# Patient Record
Sex: Female | Born: 1960 | Race: Black or African American | Hispanic: No | Marital: Single | Smoking: Former smoker
Health system: Southern US, Community
[De-identification: ages and names within clinical notes are randomized; demographics above are authoritative.]

## PROBLEM LIST (undated history)

## (undated) DIAGNOSIS — E785 Hyperlipidemia, unspecified: Secondary | ICD-10-CM

## (undated) DIAGNOSIS — F419 Anxiety disorder, unspecified: Secondary | ICD-10-CM

## (undated) DIAGNOSIS — F32A Depression, unspecified: Secondary | ICD-10-CM

## (undated) DIAGNOSIS — I10 Essential (primary) hypertension: Secondary | ICD-10-CM

## (undated) HISTORY — PX: ABDOMINAL HYSTERECTOMY: SHX81

---

## 2019-07-05 ENCOUNTER — Emergency Department (HOSPITAL_COMMUNITY)
Admission: EM | Admit: 2019-07-05 | Discharge: 2019-07-05 | Disposition: A | Discharge status: Home / Self Care | Payer: Medicaid Other | Attending: Emergency Medicine | Admitting: Emergency Medicine

## 2019-07-05 ENCOUNTER — Other Ambulatory Visit: Payer: Self-pay

## 2019-07-05 ENCOUNTER — Emergency Department (HOSPITAL_COMMUNITY): Payer: Medicaid Other

## 2019-07-05 ENCOUNTER — Encounter (HOSPITAL_COMMUNITY): Payer: Self-pay

## 2019-07-05 DIAGNOSIS — E876 Hypokalemia: Secondary | ICD-10-CM | POA: Insufficient documentation

## 2019-07-05 DIAGNOSIS — J9811 Atelectasis: Secondary | ICD-10-CM | POA: Diagnosis not present

## 2019-07-05 DIAGNOSIS — M545 Low back pain: Secondary | ICD-10-CM | POA: Diagnosis not present

## 2019-07-05 DIAGNOSIS — I10 Essential (primary) hypertension: Secondary | ICD-10-CM | POA: Diagnosis not present

## 2019-07-05 DIAGNOSIS — R58 Hemorrhage, not elsewhere classified: Secondary | ICD-10-CM | POA: Diagnosis not present

## 2019-07-05 DIAGNOSIS — K8020 Calculus of gallbladder without cholecystitis without obstruction: Secondary | ICD-10-CM | POA: Diagnosis not present

## 2019-07-05 DIAGNOSIS — R1084 Generalized abdominal pain: Secondary | ICD-10-CM | POA: Diagnosis not present

## 2019-07-05 DIAGNOSIS — R0602 Shortness of breath: Secondary | ICD-10-CM | POA: Diagnosis not present

## 2019-07-05 DIAGNOSIS — R0789 Other chest pain: Secondary | ICD-10-CM | POA: Insufficient documentation

## 2019-07-05 DIAGNOSIS — R310 Gross hematuria: Secondary | ICD-10-CM | POA: Diagnosis not present

## 2019-07-05 DIAGNOSIS — R52 Pain, unspecified: Secondary | ICD-10-CM | POA: Diagnosis not present

## 2019-07-05 DIAGNOSIS — N390 Urinary tract infection, site not specified: Secondary | ICD-10-CM | POA: Insufficient documentation

## 2019-07-05 DIAGNOSIS — R079 Chest pain, unspecified: Secondary | ICD-10-CM | POA: Diagnosis not present

## 2019-07-05 DIAGNOSIS — M5489 Other dorsalgia: Secondary | ICD-10-CM | POA: Diagnosis not present

## 2019-07-05 DIAGNOSIS — R000 Tachycardia, unspecified: Secondary | ICD-10-CM | POA: Diagnosis not present

## 2019-07-05 HISTORY — DX: Depression, unspecified: F32.A

## 2019-07-05 HISTORY — DX: Hyperlipidemia, unspecified: E78.5

## 2019-07-05 HISTORY — DX: Anxiety disorder, unspecified: F41.9

## 2019-07-05 HISTORY — DX: Essential (primary) hypertension: I10

## 2019-07-05 LAB — CBC
HCT: 44.9 % (ref 36.0–46.0)
Hemoglobin: 14.3 g/dL (ref 12.0–15.0)
MCH: 31 pg (ref 26.0–34.0)
MCHC: 31.8 g/dL (ref 30.0–36.0)
MCV: 97.2 fL (ref 80.0–100.0)
Platelets: 465 10*3/uL — ABNORMAL HIGH (ref 150–400)
RBC: 4.62 MIL/uL (ref 3.87–5.11)
RDW: 17.2 % — ABNORMAL HIGH (ref 11.5–15.5)
WBC: 12.5 10*3/uL — ABNORMAL HIGH (ref 4.0–10.5)
nRBC: 0 % (ref 0.0–0.2)

## 2019-07-05 LAB — URINALYSIS, ROUTINE W REFLEX MICROSCOPIC
Glucose, UA: NEGATIVE mg/dL
Ketones, ur: NEGATIVE mg/dL
Nitrite: POSITIVE — AB
Protein, ur: 100 mg/dL — AB
RBC / HPF: >50 RBC/hpf — ABNORMAL HIGH (ref 0–5)
Specific Gravity, Urine: 1.023 (ref 1.005–1.030)
WBC, UA: >50 WBC/hpf — ABNORMAL HIGH (ref 0–5)
pH: 5 (ref 5.0–8.0)

## 2019-07-05 LAB — MAGNESIUM: Magnesium: 2.1 mg/dL (ref 1.7–2.4)

## 2019-07-05 LAB — WET PREP, GENITAL
Clue Cells Wet Prep HPF POC: NONE SEEN
Sperm: NONE SEEN
Trich, Wet Prep: NONE SEEN
WBC, Wet Prep HPF POC: NONE SEEN
Yeast Wet Prep HPF POC: NONE SEEN

## 2019-07-05 LAB — BASIC METABOLIC PANEL
Anion gap: 15 (ref 5–15)
BUN: 7 mg/dL (ref 6–20)
CO2: 26 mmol/L (ref 22–32)
Calcium: 8.8 mg/dL — ABNORMAL LOW (ref 8.9–10.3)
Chloride: 94 mmol/L — ABNORMAL LOW (ref 98–111)
Creatinine, Ser: 0.48 mg/dL (ref 0.44–1.00)
GFR calc Af Amer: >60 mL/min (ref >60)
GFR calc non Af Amer: >60 mL/min (ref >60)
Glucose, Bld: 122 mg/dL — ABNORMAL HIGH (ref 70–99)
Potassium: 2.9 mmol/L — ABNORMAL LOW (ref 3.5–5.1)
Sodium: 135 mmol/L (ref 135–145)

## 2019-07-05 LAB — TROPONIN I (HIGH SENSITIVITY): Troponin I (High Sensitivity): 10 ng/L (ref <18)

## 2019-07-05 LAB — D-DIMER, QUANTITATIVE: D-Dimer, Quant: 0.32 ug/mL-FEU (ref 0.00–0.50)

## 2019-07-05 MED ORDER — POTASSIUM CHLORIDE ER 10 MEQ PO TBCR
40.0000 meq | EXTENDED_RELEASE_TABLET | Freq: Every day | ORAL | 0 refills | Status: DC
Start: 2019-07-05 — End: 2019-07-05

## 2019-07-05 MED ORDER — POTASSIUM CHLORIDE CRYS ER 20 MEQ PO TBCR
40.0000 meq | EXTENDED_RELEASE_TABLET | Freq: Once | ORAL | Status: AC
  Administered 2019-07-05: 40 meq via ORAL
  Filled 2019-07-05: qty 2

## 2019-07-05 MED ORDER — CEPHALEXIN 500 MG PO CAPS
500.0000 mg | ORAL_CAPSULE | Freq: Two times a day (BID) | ORAL | 0 refills | Status: AC
Start: 2019-07-05 — End: 2019-07-12

## 2019-07-05 MED ORDER — POTASSIUM CHLORIDE ER 10 MEQ PO TBCR
40.0000 meq | EXTENDED_RELEASE_TABLET | Freq: Every day | ORAL | 0 refills | Status: DC
Start: 2019-07-05 — End: 2019-11-08

## 2019-07-05 MED ORDER — CEPHALEXIN 500 MG PO CAPS
500.0000 mg | ORAL_CAPSULE | Freq: Two times a day (BID) | ORAL | 0 refills | Status: DC
Start: 2019-07-05 — End: 2019-07-05

## 2019-07-05 MED ORDER — SODIUM CHLORIDE 0.9 % IV BOLUS
1000.0000 mL | Freq: Once | INTRAVENOUS | Status: AC
  Administered 2019-07-05: 1000 mL via INTRAVENOUS

## 2019-07-05 NOTE — Discharge Instructions (Signed)
Take the antibiotics as prescribed. You will need to take the potassium pills as well.  You will need to have your potassium level rechecked by your primary care provider in 1 week. Return to the ED for worsening chest pain, shortness of breath, leg swelling, severe abdominal pain or fever.

## 2019-07-05 NOTE — ED Provider Notes (Signed)
[facility] DEPT Provider Note   CSN: [id number] Arrival date & time: 07/05/19  0054     History Chief Complaint  Patient presents with  . Back Pain    Lydia Vasquez is a 59 y.o. female with a past medical history of hypertension, hyperlipidemia, anxiety presenting to the ED with multiple complaints. 1.  Complains of back pain.  Reports intermittent lower back pain for the past several weeks.  Pain recently got worse.  Feels like "something is going to burst in my back."  She denies any numbness in legs, loss of bowel or bladder function, injuries or falls.  She also reports blood in her urine and blood when she wipes.  She is concerned that this may be hematuria, she states that "I had a hysterectomy so I do not get.  So I do not think it is vaginal."  States that she is not currently sexually active.  Denies vaginal discharge, pelvic pain, abdominal pain, vomiting or diarrhea. She has not been sexually active for several years and is not concerned about STDs. 2.  Complains of chest pain.  Reports intermittent chest pressure in the central chest area without specific aggravating or alleviating factor.  This has been going on for several weeks as well.  She has not tried any medications to help with her back pain or chest pain.  She denies any leg swelling, history of DVT, PE, MI, recent immobilization, fever, cough or shortness of breath.  HPI     Past Medical History:  Diagnosis Date  . Anxiety   . Depression   . Hyperlipidemia   . Hypertension     There are no problems to display for this patient.   Past Surgical History:  Procedure Laterality Date  . ABDOMINAL HYSTERECTOMY       OB History   No obstetric history on file.     No family history on file.  Social History   Tobacco Use  . Smoking status: Not on file  Substance Use Topics  . Alcohol use: Not on file  . Drug use: Not on file    Home Medications Prior to Admission  medications   Medication Sig Start Date End Date Taking? Authorizing Provider  cephALEXin (KEFLEX) 500 MG capsule Take 1 capsule (500 mg total) by mouth 2 (two) times daily for 7 days. 07/05/19 07/12/19  Airik Goodlin, PA-C  potassium chloride (KLOR-CON) 10 MEQ tablet Take 4 tablets (40 mEq total) by mouth daily for 5 days. 07/05/19 07/10/19  Delia Heady, PA-C    Allergies    Patient has no known allergies.  Review of Systems   Review of Systems  Constitutional: Positive for fatigue. Negative for appetite change, chills and fever.  HENT: Negative for ear pain, rhinorrhea, sneezing and sore throat.   Eyes: Negative for photophobia and visual disturbance.  Respiratory: Negative for cough, chest tightness, shortness of breath and wheezing.   Cardiovascular: Positive for chest pain. Negative for palpitations.  Gastrointestinal: Negative for abdominal pain, blood in stool, constipation, diarrhea, nausea and vomiting.  Genitourinary: Positive for hematuria (?) and vaginal bleeding (?). Negative for dysuria, urgency and vaginal discharge.  Musculoskeletal: Positive for back pain. Negative for myalgias.  Skin: Negative for rash.  Neurological: Negative for dizziness, weakness and light-headedness.    Physical Exam Updated Vital Signs BP (!) 162/96   Pulse 100   Temp 98.9 F (37.2 C) (Oral)   Resp (!) 24   Ht 5\' 11"  (1.803 m)  Wt 127 kg   SpO2 98%   BMI 39.05 kg/m   Physical Exam Vitals and nursing note reviewed. Exam conducted with a chaperone present.  Constitutional:      General: She is not in acute distress.    Appearance: She is well-developed.  HENT:     Head: Normocephalic and atraumatic.     Nose: Nose normal.  Eyes:     General: No scleral icterus.       Right eye: No discharge.        Left eye: No discharge.     Conjunctiva/sclera: Conjunctivae normal.  Cardiovascular:     Rate and Rhythm: Normal rate and regular rhythm.     Heart sounds: Normal heart sounds. No  murmur heard.  No friction rub. No gallop.   Pulmonary:     Effort: Pulmonary effort is normal. No respiratory distress.     Breath sounds: Normal breath sounds.  Abdominal:     General: Bowel sounds are normal. There is no distension.     Palpations: Abdomen is soft.     Tenderness: There is no abdominal tenderness. There is no guarding.  Genitourinary:    Comments: Pelvic exam: normal external genitalia without evidence of trauma. VULVA: normal appearing vulva with no masses, tenderness or lesion. VAGINA: normal appearing vagina with normal color and discharge, no lesions. No vaginal bleeding noted. CERVIX: normal appearing cervix without lesions, cervical motion tenderness absent, cervical os closed with out purulent discharge; No vaginal discharge. Wet prep and DNA probe for chlamydia and GC obtained.   ADNEXA: normal adnexa in size, nontender and no masses UTERUS: uterus is normal size, shape, consistency and nontender.   Musculoskeletal:        General: Normal range of motion.     Cervical back: Normal range of motion and neck supple.     Lumbar back: Tenderness and bony tenderness present.       Back:     Comments: No midline spinal tenderness present in lumbar, thoracic or cervical spine. No step-off palpated. No visible bruising, edema or temperature change noted. No objective signs of numbness present. No saddle anesthesia. 2+ DP pulses bilaterally. Sensation intact to light touch. Strength 5/5 in bilateral lower extremities.  Skin:    General: Skin is warm and dry.     Findings: No rash.  Neurological:     Mental Status: She is alert.     Motor: No abnormal muscle tone.     Coordination: Coordination normal.     ED Results / Procedures / Treatments   Labs (all labs ordered are listed, but only abnormal results are displayed) Labs Reviewed  URINALYSIS, ROUTINE W REFLEX MICROSCOPIC - Abnormal; Notable for the following components:      Result Value   Color, Urine AMBER  (*)    APPearance CLOUDY (*)    Hgb urine dipstick LARGE (*)    Bilirubin Urine SMALL (*)    Protein, ur 100 (*)    Nitrite POSITIVE (*)    Leukocytes,Ua SMALL (*)    RBC / HPF >50 (*)    WBC, UA >50 (*)    Bacteria, UA MANY (*)    All other components within normal limits  CBC - Abnormal; Notable for the following components:   WBC 12.5 (*)    RDW 17.2 (*)    Platelets 465 (*)    All other components within normal limits  BASIC METABOLIC PANEL - Abnormal; Notable for the following components:  Potassium 2.9 (*)    Chloride 94 (*)    Glucose, Bld 122 (*)    Calcium 8.8 (*)    All other components within normal limits  WET PREP, GENITAL  D-DIMER, QUANTITATIVE (NOT AT [facility])  MAGNESIUM  TROPONIN I (HIGH SENSITIVITY)    EKG None  Radiology DG Chest 2 View  Result Date: 07/05/2019 CLINICAL DATA:  Chest pain and shortness of breath EXAM: CHEST - 2 VIEW COMPARISON:  None. FINDINGS: Low lung volumes with streaky density at both bases. Mild eventration of the right diaphragm. There is no edema, consolidation, effusion, or pneumothorax. Normal heart size and mediastinal contours. IMPRESSION: Low volume chest with atelectasis at the bases. Electronically Signed   By: Marnee Spring M.D.   On: 07/05/2019 07:41   CT Renal Stone Study  Result Date: 07/05/2019 CLINICAL DATA:  Acute bilateral flank pain, gross hematuria. EXAM: CT ABDOMEN AND PELVIS WITHOUT CONTRAST TECHNIQUE: Multidetector CT imaging of the abdomen and pelvis was performed following the standard protocol without IV contrast. COMPARISON:  None. FINDINGS: Lower chest: No acute abnormality. Hepatobiliary: Minimal cholelithiasis is noted without inflammation. No biliary dilatation is noted. Hepatic steatosis is noted. Pancreas: Unremarkable. No pancreatic ductal dilatation or surrounding inflammatory changes. Spleen: Normal in size without focal abnormality. Adrenals/Urinary Tract: Adrenal glands are unremarkable. Kidneys are  normal, without renal calculi, focal lesion, or hydronephrosis. Bladder is unremarkable. Stomach/Bowel: Stomach is within normal limits. Appendix appears normal. No evidence of bowel wall thickening, distention, or inflammatory changes. Vascular/Lymphatic: Aortic atherosclerosis. No enlarged abdominal or pelvic lymph nodes. Reproductive: Status post hysterectomy. No adnexal masses. Other: No abdominal wall hernia or abnormality. No abdominopelvic ascites. Musculoskeletal: No acute or significant osseous findings. IMPRESSION: 1. Hepatic steatosis. 2. Minimal cholelithiasis without inflammation. 3. No renal or ureteral calculi are noted. No hydronephrosis or renal obstruction is noted. Aortic Atherosclerosis (ICD10-I70.0). Electronically Signed   By: Lupita Raider M.D.   On: 07/05/2019 09:21    Procedures Procedures (including critical care time)  Medications Ordered in ED Medications  sodium chloride 0.9 % bolus 1,000 mL (0 mLs Intravenous Stopped 07/05/19 1015)  potassium chloride SA (KLOR-CON) CR tablet 40 mEq (40 mEq Oral Given 07/05/19 0754)    ED Course  I have reviewed the triage vital signs and the nursing notes.  Pertinent labs & imaging results that were available during my care of the patient were reviewed by me and considered in my medical decision making (see chart for details).  Clinical Course as of Jul 05 1022  Tue Jul 05, 2019  0654 Potassium(!): 2.9 [HK]  0654 D-Dimer, Quant: 0.32 [HK]  0842 Nitrite(!): POSITIVE [HK]  0925 Glori Luis): SMALL [HK]  0925 Bacteria, UA(!): MANY [HK]    Clinical Course User Index [HK] Dietrich Pates, PA-C   MDM Rules/Calculators/A&P                          59 year old female with past medical history of hypertension, hyperlipidemia, anxiety presenting to the ED with multiple complaints. 1.  Back pain.  Reports hematuria and dysuria.  Question that this could be vaginal bleeding although pelvic exam revealed no blood in the vaginal  vault.  She denies vaginal discharge.  Urinalysis positive for nitrite, leukocytes and many bacteria.  Also showing hematuria.  CT renal stone study without any acute or concerning findings.  No CVA tenderness noted on exam although there is muscular tenderness of the lumbar spine.  Suspect that her symptoms  are due to cystitis with hematuria.  Normal kidney function noted.  Potassium low at 2.9 which is repleted orally with normal magnesium level.  Will treat with antibiotics for UTI and have her follow-up for magnesium level rechecked. 2.  Chest pain that has been intermittent for the past several weeks to months.  Denies leg swelling, history of DVT, PE or MI.  EKG here shows sinus tachycardia, no ischemic changes.  Troponin is negative, normal D-dimer, CBC and chest x-ray are unremarkable.  Initial tachycardia has resolved with IV fluids.  Suspect that symptoms could be due to anxiety versus musculoskeletal pain.  Doubt ACS as her work-up has been reassuring here she has no history of CAD.  PE was able to be ruled out with a negative D-dimer.  Patient is comfortable with establishing care with a primary care provider and returning for worsening symptoms.  All imaging, if done today, including plain films, CT scans, and ultrasounds, independently reviewed by me, and interpretations confirmed via formal radiology reads.  Patient is hemodynamically stable, in NAD, and able to ambulate in the ED. Evaluation does not show pathology that would require ongoing emergent intervention or inpatient treatment. I explained the diagnosis to the patient. Pain has been managed and has no complaints prior to discharge. Patient is comfortable with above plan and is stable for discharge at this time. All questions were answered prior to disposition. Strict return precautions for returning to the ED were discussed. Encouraged follow up with PCP.   An After Visit Summary was printed and given to the patient.   Portions of  this note were generated with Scientist, clinical (histocompatibility and immunogenetics). Dictation errors may occur despite best attempts at proofreading.  Final Clinical Impression(s) / ED Diagnoses Final diagnoses:  Lower urinary tract infectious disease  Hypokalemia  Chest wall pain    Rx / DC Orders ED Discharge Orders         Ordered    cephALEXin (KEFLEX) 500 MG capsule  2 times daily,   Status:  Discontinued     Reprint     07/05/19 0944    potassium chloride (KLOR-CON) 10 MEQ tablet  Daily,   Status:  Discontinued     Reprint     07/05/19 0944    cephALEXin (KEFLEX) 500 MG capsule  2 times daily     Discontinue  Reprint     07/05/19 1006    potassium chloride (KLOR-CON) 10 MEQ tablet  Daily     Discontinue  Reprint     07/05/19 [address] PA-C 07/05/19 1024    Lorre Nick, MD 07/07/19 [phone]

## 2019-07-05 NOTE — ED Triage Notes (Signed)
Patient arrived via [facility] with complaints of lower back pain over the last two weeks. States she has some pain with urination and possible bleeding but is unsure if the blood is from urine or not. Patient had a hysterectomy in 2012 but did not have a follow up. Patient has recently moved to [facility] and has lost everything in the move and has no reestablished a primary care.

## 2019-07-21 DIAGNOSIS — Z419 Encounter for procedure for purposes other than remedying health state, unspecified: Secondary | ICD-10-CM | POA: Diagnosis not present

## 2019-08-21 DIAGNOSIS — Z419 Encounter for procedure for purposes other than remedying health state, unspecified: Secondary | ICD-10-CM | POA: Diagnosis not present

## 2019-09-21 DIAGNOSIS — Z419 Encounter for procedure for purposes other than remedying health state, unspecified: Secondary | ICD-10-CM | POA: Diagnosis not present

## 2019-10-21 DIAGNOSIS — Z419 Encounter for procedure for purposes other than remedying health state, unspecified: Secondary | ICD-10-CM | POA: Diagnosis not present

## 2019-10-24 ENCOUNTER — Other Ambulatory Visit: Payer: Self-pay

## 2019-10-24 DIAGNOSIS — J9811 Atelectasis: Secondary | ICD-10-CM | POA: Diagnosis present

## 2019-10-24 DIAGNOSIS — M546 Pain in thoracic spine: Secondary | ICD-10-CM | POA: Diagnosis present

## 2019-10-24 DIAGNOSIS — K8080 Other cholelithiasis without obstruction: Secondary | ICD-10-CM | POA: Diagnosis present

## 2019-10-24 DIAGNOSIS — E8779 Other fluid overload: Secondary | ICD-10-CM | POA: Diagnosis present

## 2019-10-24 DIAGNOSIS — E876 Hypokalemia: Secondary | ICD-10-CM | POA: Diagnosis present

## 2019-10-24 DIAGNOSIS — Z8541 Personal history of malignant neoplasm of cervix uteri: Secondary | ICD-10-CM

## 2019-10-24 DIAGNOSIS — Z90710 Acquired absence of both cervix and uterus: Secondary | ICD-10-CM

## 2019-10-24 DIAGNOSIS — N390 Urinary tract infection, site not specified: Secondary | ICD-10-CM | POA: Diagnosis present

## 2019-10-24 DIAGNOSIS — R002 Palpitations: Secondary | ICD-10-CM | POA: Diagnosis not present

## 2019-10-24 DIAGNOSIS — Z539 Procedure and treatment not carried out, unspecified reason: Secondary | ICD-10-CM | POA: Diagnosis not present

## 2019-10-24 DIAGNOSIS — Z79899 Other long term (current) drug therapy: Secondary | ICD-10-CM

## 2019-10-24 DIAGNOSIS — D539 Nutritional anemia, unspecified: Secondary | ICD-10-CM | POA: Diagnosis present

## 2019-10-24 DIAGNOSIS — F419 Anxiety disorder, unspecified: Secondary | ICD-10-CM | POA: Diagnosis present

## 2019-10-24 DIAGNOSIS — A419 Sepsis, unspecified organism: Secondary | ICD-10-CM | POA: Diagnosis not present

## 2019-10-24 DIAGNOSIS — R310 Gross hematuria: Secondary | ICD-10-CM | POA: Diagnosis present

## 2019-10-24 DIAGNOSIS — I10 Essential (primary) hypertension: Secondary | ICD-10-CM | POA: Diagnosis present

## 2019-10-24 DIAGNOSIS — Z20822 Contact with and (suspected) exposure to covid-19: Secondary | ICD-10-CM | POA: Diagnosis present

## 2019-10-24 DIAGNOSIS — R6520 Severe sepsis without septic shock: Secondary | ICD-10-CM | POA: Diagnosis present

## 2019-10-24 DIAGNOSIS — I472 Ventricular tachycardia: Secondary | ICD-10-CM | POA: Diagnosis not present

## 2019-10-24 DIAGNOSIS — A4151 Sepsis due to Escherichia coli [E. coli]: Principal | ICD-10-CM | POA: Diagnosis present

## 2019-10-24 DIAGNOSIS — K7031 Alcoholic cirrhosis of liver with ascites: Secondary | ICD-10-CM | POA: Diagnosis present

## 2019-10-24 DIAGNOSIS — E785 Hyperlipidemia, unspecified: Secondary | ICD-10-CM | POA: Diagnosis present

## 2019-10-24 DIAGNOSIS — F32A Depression, unspecified: Secondary | ICD-10-CM | POA: Diagnosis present

## 2019-10-24 DIAGNOSIS — R791 Abnormal coagulation profile: Secondary | ICD-10-CM | POA: Diagnosis present

## 2019-10-24 DIAGNOSIS — E669 Obesity, unspecified: Secondary | ICD-10-CM | POA: Diagnosis present

## 2019-10-24 DIAGNOSIS — Z6837 Body mass index (BMI) 37.0-37.9, adult: Secondary | ICD-10-CM

## 2019-10-24 DIAGNOSIS — F1020 Alcohol dependence, uncomplicated: Secondary | ICD-10-CM | POA: Diagnosis present

## 2019-10-24 DIAGNOSIS — I4510 Unspecified right bundle-branch block: Secondary | ICD-10-CM | POA: Diagnosis present

## 2019-10-24 DIAGNOSIS — D75839 Thrombocytosis, unspecified: Secondary | ICD-10-CM | POA: Diagnosis present

## 2019-10-24 DIAGNOSIS — E781 Pure hyperglyceridemia: Secondary | ICD-10-CM | POA: Diagnosis present

## 2019-10-24 NOTE — ED Triage Notes (Signed)
BIB EMS due to hematuria, has been treated recently and told if it returns to come back to ED

## 2019-10-25 ENCOUNTER — Encounter (HOSPITAL_COMMUNITY): Payer: Self-pay | Admitting: Family Medicine

## 2019-10-25 ENCOUNTER — Inpatient Hospital Stay (HOSPITAL_COMMUNITY): Payer: Medicaid Other

## 2019-10-25 ENCOUNTER — Emergency Department (HOSPITAL_COMMUNITY): Payer: Medicaid Other

## 2019-10-25 ENCOUNTER — Inpatient Hospital Stay (HOSPITAL_COMMUNITY)
Admission: EM | Admit: 2019-10-25 | Discharge: 2019-11-08 | DRG: 872 | Disposition: A | Discharge status: Home / Self Care | Payer: Medicaid Other | Attending: Internal Medicine | Admitting: Internal Medicine

## 2019-10-25 DIAGNOSIS — K7030 Alcoholic cirrhosis of liver without ascites: Secondary | ICD-10-CM | POA: Diagnosis not present

## 2019-10-25 DIAGNOSIS — I10 Essential (primary) hypertension: Secondary | ICD-10-CM | POA: Diagnosis not present

## 2019-10-25 DIAGNOSIS — R791 Abnormal coagulation profile: Secondary | ICD-10-CM | POA: Diagnosis present

## 2019-10-25 DIAGNOSIS — R310 Gross hematuria: Secondary | ICD-10-CM | POA: Diagnosis present

## 2019-10-25 DIAGNOSIS — K7031 Alcoholic cirrhosis of liver with ascites: Secondary | ICD-10-CM | POA: Diagnosis not present

## 2019-10-25 DIAGNOSIS — I4510 Unspecified right bundle-branch block: Secondary | ICD-10-CM | POA: Diagnosis present

## 2019-10-25 DIAGNOSIS — K8020 Calculus of gallbladder without cholecystitis without obstruction: Secondary | ICD-10-CM | POA: Diagnosis not present

## 2019-10-25 DIAGNOSIS — D72829 Elevated white blood cell count, unspecified: Secondary | ICD-10-CM

## 2019-10-25 DIAGNOSIS — A4151 Sepsis due to Escherichia coli [E. coli]: Secondary | ICD-10-CM | POA: Diagnosis not present

## 2019-10-25 DIAGNOSIS — R748 Abnormal levels of other serum enzymes: Secondary | ICD-10-CM

## 2019-10-25 DIAGNOSIS — E781 Pure hyperglyceridemia: Secondary | ICD-10-CM | POA: Diagnosis present

## 2019-10-25 DIAGNOSIS — R6520 Severe sepsis without septic shock: Secondary | ICD-10-CM | POA: Diagnosis not present

## 2019-10-25 DIAGNOSIS — E876 Hypokalemia: Secondary | ICD-10-CM | POA: Diagnosis not present

## 2019-10-25 DIAGNOSIS — D539 Nutritional anemia, unspecified: Secondary | ICD-10-CM | POA: Diagnosis not present

## 2019-10-25 DIAGNOSIS — R609 Edema, unspecified: Secondary | ICD-10-CM | POA: Diagnosis not present

## 2019-10-25 DIAGNOSIS — F1020 Alcohol dependence, uncomplicated: Secondary | ICD-10-CM | POA: Diagnosis present

## 2019-10-25 DIAGNOSIS — J9811 Atelectasis: Secondary | ICD-10-CM | POA: Diagnosis not present

## 2019-10-25 DIAGNOSIS — D75839 Thrombocytosis, unspecified: Secondary | ICD-10-CM | POA: Diagnosis not present

## 2019-10-25 DIAGNOSIS — E669 Obesity, unspecified: Secondary | ICD-10-CM | POA: Diagnosis present

## 2019-10-25 DIAGNOSIS — R109 Unspecified abdominal pain: Secondary | ICD-10-CM | POA: Diagnosis not present

## 2019-10-25 DIAGNOSIS — E8779 Other fluid overload: Secondary | ICD-10-CM | POA: Diagnosis not present

## 2019-10-25 DIAGNOSIS — A419 Sepsis, unspecified organism: Secondary | ICD-10-CM | POA: Diagnosis not present

## 2019-10-25 DIAGNOSIS — Z20822 Contact with and (suspected) exposure to covid-19: Secondary | ICD-10-CM | POA: Diagnosis not present

## 2019-10-25 DIAGNOSIS — I472 Ventricular tachycardia: Secondary | ICD-10-CM | POA: Diagnosis not present

## 2019-10-25 DIAGNOSIS — R7989 Other specified abnormal findings of blood chemistry: Secondary | ICD-10-CM

## 2019-10-25 DIAGNOSIS — R9389 Abnormal findings on diagnostic imaging of other specified body structures: Secondary | ICD-10-CM

## 2019-10-25 DIAGNOSIS — F32A Depression, unspecified: Secondary | ICD-10-CM | POA: Diagnosis present

## 2019-10-25 DIAGNOSIS — R0602 Shortness of breath: Secondary | ICD-10-CM | POA: Diagnosis not present

## 2019-10-25 DIAGNOSIS — N390 Urinary tract infection, site not specified: Secondary | ICD-10-CM | POA: Diagnosis not present

## 2019-10-25 DIAGNOSIS — R945 Abnormal results of liver function studies: Secondary | ICD-10-CM | POA: Diagnosis not present

## 2019-10-25 DIAGNOSIS — F419 Anxiety disorder, unspecified: Secondary | ICD-10-CM | POA: Diagnosis present

## 2019-10-25 DIAGNOSIS — K760 Fatty (change of) liver, not elsewhere classified: Secondary | ICD-10-CM | POA: Diagnosis not present

## 2019-10-25 DIAGNOSIS — K8080 Other cholelithiasis without obstruction: Secondary | ICD-10-CM | POA: Diagnosis present

## 2019-10-25 DIAGNOSIS — E785 Hyperlipidemia, unspecified: Secondary | ICD-10-CM | POA: Diagnosis not present

## 2019-10-25 DIAGNOSIS — K7460 Unspecified cirrhosis of liver: Secondary | ICD-10-CM

## 2019-10-25 DIAGNOSIS — Z539 Procedure and treatment not carried out, unspecified reason: Secondary | ICD-10-CM | POA: Diagnosis not present

## 2019-10-25 LAB — URINALYSIS, ROUTINE W REFLEX MICROSCOPIC
Glucose, UA: 50 mg/dL — AB
Ketones, ur: NEGATIVE mg/dL
Leukocytes,Ua: NEGATIVE
Nitrite: POSITIVE — AB
Protein, ur: 100 mg/dL — AB
Specific Gravity, Urine: 1.021 (ref 1.005–1.030)
pH: 5 (ref 5.0–8.0)

## 2019-10-25 LAB — CBC WITH DIFFERENTIAL/PLATELET
Abs Immature Granulocytes: 0.13 10*3/uL — ABNORMAL HIGH (ref 0.00–0.07)
Basophils Absolute: 0.1 10*3/uL (ref 0.0–0.1)
Basophils Relative: 1 %
Eosinophils Absolute: 0.1 10*3/uL (ref 0.0–0.5)
Eosinophils Relative: 0 %
HCT: 35.3 % — ABNORMAL LOW (ref 36.0–46.0)
Hemoglobin: 12.3 g/dL (ref 12.0–15.0)
Immature Granulocytes: 1 %
Lymphocytes Relative: 13 %
Lymphs Abs: 2.9 10*3/uL (ref 0.7–4.0)
MCH: 34.6 pg — ABNORMAL HIGH (ref 26.0–34.0)
MCHC: 34.8 g/dL (ref 30.0–36.0)
MCV: 99.2 fL (ref 80.0–100.0)
Monocytes Absolute: 0.9 10*3/uL (ref 0.1–1.0)
Monocytes Relative: 4 %
Neutro Abs: 18.7 10*3/uL — ABNORMAL HIGH (ref 1.7–7.7)
Neutrophils Relative %: 81 %
Platelets: 459 10*3/uL — ABNORMAL HIGH (ref 150–400)
RBC: 3.56 MIL/uL — ABNORMAL LOW (ref 3.87–5.11)
RDW: 19 % — ABNORMAL HIGH (ref 11.5–15.5)
WBC: 22.8 10*3/uL — ABNORMAL HIGH (ref 4.0–10.5)
nRBC: 0 % (ref 0.0–0.2)

## 2019-10-25 LAB — BASIC METABOLIC PANEL
Anion gap: 19 — ABNORMAL HIGH (ref 5–15)
BUN: 10 mg/dL (ref 6–20)
CO2: 27 mmol/L (ref 22–32)
Calcium: 9.2 mg/dL (ref 8.9–10.3)
Chloride: 90 mmol/L — ABNORMAL LOW (ref 98–111)
Creatinine, Ser: 0.58 mg/dL (ref 0.44–1.00)
GFR calc Af Amer: >60 mL/min (ref >60)
GFR calc non Af Amer: >60 mL/min (ref >60)
Glucose, Bld: 121 mg/dL — ABNORMAL HIGH (ref 70–99)
Potassium: 3.1 mmol/L — ABNORMAL LOW (ref 3.5–5.1)
Sodium: 136 mmol/L (ref 135–145)

## 2019-10-25 LAB — HEPATITIS B CORE ANTIBODY, TOTAL: Hep B Core Total Ab: NONREACTIVE

## 2019-10-25 LAB — LACTIC ACID, PLASMA
Lactic Acid, Venous: 3.6 mmol/L (ref 0.5–1.9)
Lactic Acid, Venous: 4.1 mmol/L (ref 0.5–1.9)
Lactic Acid, Venous: 2.6 mmol/L (ref 0.5–1.9)
Lactic Acid, Venous: 2.3 mmol/L (ref 0.5–1.9)
Lactic Acid, Venous: 2.7 mmol/L (ref 0.5–1.9)

## 2019-10-25 LAB — HEPATIC FUNCTION PANEL
ALT: 20 U/L (ref 0–44)
AST: 103 U/L — ABNORMAL HIGH (ref 15–41)
Albumin: 2.5 g/dL — ABNORMAL LOW (ref 3.5–5.0)
Alkaline Phosphatase: 163 U/L — ABNORMAL HIGH (ref 38–126)
Bilirubin, Direct: 8.6 mg/dL — ABNORMAL HIGH (ref 0.0–0.2)
Indirect Bilirubin: 7 mg/dL — ABNORMAL HIGH (ref 0.3–0.9)
Total Bilirubin: 15.6 mg/dL — ABNORMAL HIGH (ref 0.3–1.2)
Total Protein: 8.4 g/dL — ABNORMAL HIGH (ref 6.5–8.1)

## 2019-10-25 LAB — RESPIRATORY PANEL BY RT PCR (FLU A&B, COVID)
Influenza A by PCR: NEGATIVE
Influenza B by PCR: NEGATIVE
SARS Coronavirus 2 by RT PCR: NEGATIVE

## 2019-10-25 LAB — HIV ANTIBODY (ROUTINE TESTING W REFLEX): HIV Screen 4th Generation wRfx: NONREACTIVE

## 2019-10-25 LAB — MAGNESIUM: Magnesium: 1.6 mg/dL — ABNORMAL LOW (ref 1.7–2.4)

## 2019-10-25 MED ORDER — LACTATED RINGERS IV SOLN: INTRAVENOUS | Status: DC

## 2019-10-25 MED ORDER — SODIUM CHLORIDE 0.9 % IV SOLN
500.0000 mg | Freq: Once | INTRAVENOUS | Status: AC
  Administered 2019-10-25: 500 mg via INTRAVENOUS
  Filled 2019-10-25: qty 500

## 2019-10-25 MED ORDER — SODIUM CHLORIDE 0.9 % IV BOLUS
500.0000 mL | Freq: Once | INTRAVENOUS | Status: AC
  Administered 2019-10-25: 500 mL via INTRAVENOUS

## 2019-10-25 MED ORDER — ACETAMINOPHEN 325 MG PO TABS: 650.0000 mg | ORAL_TABLET | Freq: Four times a day (QID) | ORAL | Status: DC | PRN

## 2019-10-25 MED ORDER — ONDANSETRON HCL 4 MG/2ML IJ SOLN: 4.0000 mg | Freq: Four times a day (QID) | INTRAMUSCULAR | Status: DC | PRN

## 2019-10-25 MED ORDER — LACTATED RINGERS IV BOLUS (SEPSIS)
1000.0000 mL | Freq: Once | INTRAVENOUS | Status: AC
  Administered 2019-10-25: 1000 mL via INTRAVENOUS

## 2019-10-25 MED ORDER — IOHEXOL 300 MG/ML  SOLN
100.0000 mL | Freq: Once | INTRAMUSCULAR | Status: AC | PRN
  Administered 2019-10-25: 100 mL via INTRAVENOUS

## 2019-10-25 MED ORDER — SODIUM CHLORIDE (PF) 0.9 % IJ SOLN
INTRAMUSCULAR | Status: AC
  Filled 2019-10-25: qty 50

## 2019-10-25 MED ORDER — POTASSIUM CHLORIDE CRYS ER 20 MEQ PO TBCR
20.0000 meq | EXTENDED_RELEASE_TABLET | Freq: Once | ORAL | Status: AC
  Administered 2019-10-25: 20 meq via ORAL
  Filled 2019-10-25: qty 1

## 2019-10-25 MED ORDER — ACETAMINOPHEN 650 MG RE SUPP: 650.0000 mg | Freq: Four times a day (QID) | RECTAL | Status: DC | PRN

## 2019-10-25 MED ORDER — ENOXAPARIN SODIUM 60 MG/0.6ML ~~LOC~~ SOLN
50.0000 mg | SUBCUTANEOUS | Status: DC
  Administered 2019-10-25 – 2019-10-26 (×2): 50 mg via SUBCUTANEOUS
  Filled 2019-10-25 (×2): qty 0.6

## 2019-10-25 MED ORDER — POTASSIUM CHLORIDE 2 MEQ/ML IV SOLN
INTRAVENOUS | Status: AC
  Filled 2019-10-25: qty 1000

## 2019-10-25 MED ORDER — HYDROCODONE-ACETAMINOPHEN 5-325 MG PO TABS
1.0000 | ORAL_TABLET | ORAL | Status: DC | PRN
  Administered 2019-10-25 – 2019-11-08 (×19): 2 via ORAL
  Filled 2019-10-25 (×19): qty 2

## 2019-10-25 MED ORDER — SODIUM CHLORIDE 0.9% FLUSH
3.0000 mL | Freq: Two times a day (BID) | INTRAVENOUS | Status: DC
  Administered 2019-10-25 – 2019-11-08 (×24): 3 mL via INTRAVENOUS

## 2019-10-25 MED ORDER — LACTATED RINGERS IV BOLUS (SEPSIS)
500.0000 mL | Freq: Once | INTRAVENOUS | Status: AC
  Administered 2019-10-25: 500 mL via INTRAVENOUS

## 2019-10-25 MED ORDER — SODIUM CHLORIDE 0.9 % IV SOLN
1.0000 g | INTRAVENOUS | Status: DC
  Administered 2019-10-25 – 2019-10-26 (×2): 1 g via INTRAVENOUS
  Filled 2019-10-25 (×2): qty 10

## 2019-10-25 MED ORDER — FAMOTIDINE IN NACL 20-0.9 MG/50ML-% IV SOLN
20.0000 mg | Freq: Two times a day (BID) | INTRAVENOUS | Status: DC
  Administered 2019-10-25 – 2019-10-28 (×7): 20 mg via INTRAVENOUS
  Filled 2019-10-25 (×7): qty 50

## 2019-10-25 MED ORDER — LACTATED RINGERS IV BOLUS
1000.0000 mL | Freq: Once | INTRAVENOUS | Status: AC
  Administered 2019-10-25: 1000 mL via INTRAVENOUS

## 2019-10-25 MED ORDER — SENNOSIDES-DOCUSATE SODIUM 8.6-50 MG PO TABS: 1.0000 | ORAL_TABLET | Freq: Every evening | ORAL | Status: DC | PRN

## 2019-10-25 MED ORDER — ONDANSETRON HCL 4 MG PO TABS: 4.0000 mg | ORAL_TABLET | Freq: Four times a day (QID) | ORAL | Status: DC | PRN

## 2019-10-25 NOTE — ED Notes (Signed)
Attempted to give report. Floor stated the bed has not been approved yet.

## 2019-10-25 NOTE — Plan of Care (Signed)

## 2019-10-25 NOTE — Progress Notes (Signed)
As suspected earlier, patient has abnormal LFT.  Her transaminases are normal with bilirubin of 15 and direct bilirubin of 8 and total bilirubin of 15. Alkaline phosphatase is fairly normal.  Slightly elevated AST and normal ALT. CT scan of the abdomen with hepatic steatosis, palpable liver.  Layering gallstones with normal bile duct less likely cholelithiasis. We will check dedicated upper quadrant liver ultrasound.  Hydrate and recheck level tomorrow morning. We will check hepatitis panel, however less likely acute hepatitis with normal transaminases.  Hepatic Function Latest Ref Rng & Units 10/25/2019  Total Protein 6.5 - 8.1 g/dL 8.4(H)  Albumin 3.5 - 5.0 g/dL 2.5(L)  AST 15 - 41 U/L 103(H)  ALT 0 - 44 U/L 20  Alk Phosphatase 38 - 126 U/L 163(H)  Total Bilirubin 0.3 - 1.2 mg/dL 15.6(H)  Bilirubin, Direct 0.0 - 0.2 mg/dL 8.6(H)

## 2019-10-25 NOTE — Progress Notes (Signed)
PROGRESS NOTE    Lydia Vasquez  ZOX:[id number] DOB: 04-25-1960 DOA: 10/25/2019 PCP: Patient, No Pcp Per    Brief Narrative:  59 year old female with history of cervical cancer status post hysterectomy, depression, anxiety, hypertension hyperlipidemia presented to the ER with hematuria, fatigue, nausea and back pain.  Several days of gross hematuria with progressive fatigue and nausea.  Patient also reported bilateral mid back pain. In the emergency room she was found afebrile, tachycardic, blood pressures were stable.  Chest x-ray with right lower lobe atelectasis.  Leukocytosis with white cell count of 22.8.  Lactic acid 3.6.  COVID-19 negative. Urinalysis with grossly abnormal urine.  CT scan with no hydronephrosis or collection.  Admitted and treated as severe sepsis due to UTI. Patient moved from [place] to [place] about 1 and half year ago and has not taken any of her blood pressure medicine or cholesterol medicines.  She had similar episode about 2 months ago did not need hospitalization.  Assessment & Plan:   Principal Problem:   Sepsis due to urinary tract infection (HCC) Active Problems:   Hypokalemia  Severe sepsis secondary to UTI: Present on admission. Received resuscitation fluid.  Lactic acid is trending down.  Received 4 L isotonic fluid resuscitation.  Continue monitor until normalizes.  Continue maintenance IV fluid.  Blood pressures are adequate today. Her mucous membranes are still dry, will give 1 more liter of fluid bolus. Urine culture and blood cultures pending. Continue Rocephin until culture results are available.  Hepatic steatosis/hepatomegaly: Patient has hepatomegaly, yellow sclera.  Will check LFT.  Possibly has hyperlipidemia untreated.  Will check lipid panels with morning labs.  Hypokalemia: Replaced.  Magnesium is adequate.  Further replace and monitor levels tomorrow morning.  Hypertension: History of hypertension.  Untreated for 2 years.  No  indication to start treatment at this time.  Hyperlipidemia with hepatic steatosis: Untreated for 2 years.  Will check fasting lipid panel.  She will benefit with statin on discharge, will also need a primary care physician.  History of cervical cancer status post total hysterectomy: In 2012.  CT scan with no evidence of recurrent cancer or obstruction.  DVT prophylaxis: SCDs   Code Status: Full code Family Communication: None, patient stated she is talking to her daughter. Disposition Plan: Status is: Inpatient  Remains inpatient appropriate because:Inpatient level of care appropriate due to severity of illness   Dispo: The patient is from: Home              Anticipated d/c is to: Home              Anticipated d/c date is: 2 days              Patient currently is not medically stable to d/c.         Consultants:   None  Procedures:   None  Antimicrobials:  Antibiotics Given (last 72 hours)    Date/Time Action Medication Dose Rate   10/25/19 0155 New Bag/Given   cefTRIAXone (ROCEPHIN) 1 g in sodium chloride 0.9 % 100 mL IVPB 1 g 200 mL/hr   10/25/19 0344 New Bag/Given   azithromycin (ZITHROMAX) 500 mg in sodium chloride 0.9 % 250 mL IVPB 500 mg 250 mL/hr         Subjective: Patient seen and examined.  Still in the ER.  Blood pressures are adequate.  She was able to have some appetite, she ate some breakfast.  Nausea is better since last night.  Afebrile  overnight. Lactic acid is 2.6, blood pressures are adequate. Her back pain and spasm is better. Urine was less cloudy and lighter.  Objective: Vitals:   10/25/19 0800 10/25/19 0830 10/25/19 0900 10/25/19 0930  BP: 139/85 130/71 115/71 113/76  Pulse: (!) 106 100 97 100  Resp: (!) 23 (!) 31 20 (!) 27  Temp: 98.1 F (36.7 C)     TempSrc:      SpO2: 94% 93% 93% (!) 86%  Weight:      Height:        Intake/Output Summary (Last 24 hours) at 10/25/2019 1012 Last data filed at 10/25/2019 0754 Gross per 24 hour   Intake 1108.49 ml  Output --  Net 1108.49 ml   Filed Weights   10/24/19 2011  Weight: 103.2 kg    Examination:  General exam: Appears calm but anxious.  Sick looking.  On room air.  Tongue is still dry. Yellow sclera  Respiratory system: Clear to auscultation. Respiratory effort normal.  No added sounds. Cardiovascular system: S1 & S2 heard, tachycardic.  No pedal edema. Gastrointestinal system: Abdomen is nondistended, soft and nontender.  Patient has nontender firm liver 3 cm below the costal margin. Central nervous system: Alert and oriented. No focal neurological deficits. Extremities: Symmetric 5 x 5 power. Skin: No rashes, lesions or ulcers Psychiatry: Judgement and insight appear normal. Mood & affect anxious.    Data Reviewed: I have personally reviewed following labs and imaging studies  CBC: Recent Labs  Lab 10/25/19 0039  WBC 22.8*  NEUTROABS 18.7*  HGB 12.3  HCT 35.3*  MCV 99.2  PLT 459*   Basic Metabolic Panel: Recent Labs  Lab 10/25/19 0039  NA 136  K 3.1*  CL 90*  CO2 27  GLUCOSE 121*  BUN 10  CREATININE 0.58  CALCIUM 9.2   GFR: Estimated Creatinine Clearance: 102.6 mL/min (by C-G formula based on SCr of 0.58 mg/dL). Liver Function Tests: No results for input(s): AST, ALT, ALKPHOS, BILITOT, PROT, ALBUMIN in the last 168 hours. No results for input(s): LIPASE, AMYLASE in the last 168 hours. No results for input(s): AMMONIA in the last 168 hours. Coagulation Profile: No results for input(s): INR, PROTIME in the last 168 hours. Cardiac Enzymes: No results for input(s): CKTOTAL, CKMB, CKMBINDEX, TROPONINI in the last 168 hours. BNP (last 3 results) No results for input(s): PROBNP in the last 8760 hours. HbA1C: No results for input(s): HGBA1C in the last 72 hours. CBG: No results for input(s): GLUCAP in the last 168 hours. Lipid Profile: No results for input(s): CHOL, HDL, LDLCALC, TRIG, CHOLHDL, LDLDIRECT in the last 72 hours. Thyroid  Function Tests: No results for input(s): TSH, T4TOTAL, FREET4, T3FREE, THYROIDAB in the last 72 hours. Anemia Panel: No results for input(s): VITAMINB12, FOLATE, FERRITIN, TIBC, IRON, RETICCTPCT in the last 72 hours. Sepsis Labs: Recent Labs  Lab 10/25/19 0039 10/25/19 0502 10/25/19 0800  LATICACIDVEN 3.6* 4.1* 2.6*    Recent Results (from the past 240 hour(s))  Respiratory Panel by RT PCR (Flu A&B, Covid) - Nasopharyngeal Swab     Status: None   Collection Time: 10/25/19  3:45 AM   Specimen: Nasopharyngeal Swab  Result Value Ref Range Status   SARS Coronavirus 2 by RT PCR NEGATIVE NEGATIVE Final    Comment: (NOTE) SARS-CoV-2 target nucleic acids are NOT DETECTED.  The SARS-CoV-2 RNA is generally detectable in upper respiratoy specimens during the acute phase of infection. The lowest concentration of SARS-CoV-2 viral copies this assay can detect is  131 copies/mL. A negative result does not preclude SARS-Cov-2 infection and should not be used as the sole basis for treatment or other patient management decisions. A negative result may occur with  improper specimen collection/handling, submission of specimen other than nasopharyngeal swab, presence of viral mutation(s) within the areas targeted by this assay, and inadequate number of viral copies (<131 copies/mL). A negative result must be combined with clinical observations, patient history, and epidemiological information. The expected result is Negative.  Fact Sheet for Patients:  https://www.moore.com/  Fact Sheet for Healthcare Providers:  https://www.young.biz/  This test is no t yet approved or cleared by the [place] FDA and  has been authorized for detection and/or diagnosis of SARS-CoV-2 by FDA under an Emergency Use Authorization (EUA). This EUA will remain  in effect (meaning this test can be used) for the duration of the COVID-19 declaration under Section 564(b)(1) of  the Act, 21 U.S.C. section 360bbb-3(b)(1), unless the authorization is terminated or revoked sooner.     Influenza A by PCR NEGATIVE NEGATIVE Final   Influenza B by PCR NEGATIVE NEGATIVE Final    Comment: (NOTE) The Xpert Xpress SARS-CoV-2/FLU/RSV assay is intended as an aid in  the diagnosis of influenza from Nasopharyngeal swab specimens and  should not be used as a sole basis for treatment. Nasal washings and  aspirates are unacceptable for Xpert Xpress SARS-CoV-2/FLU/RSV  testing.  Fact Sheet for Patients: https://www.moore.com/  Fact Sheet for Healthcare Providers: https://www.young.biz/  This test is not yet approved or cleared by the [place] FDA and  has been authorized for detection and/or diagnosis of SARS-CoV-2 by  FDA under an Emergency Use Authorization (EUA). This EUA will remain  in effect (meaning this test can be used) for the duration of the  Covid-19 declaration under Section 564(b)(1) of the Act, 21  U.S.C. section 360bbb-3(b)(1), unless the authorization is  terminated or revoked. Performed at [facility], [address], [place], [place] [address]          Radiology Studies: CT ABDOMEN PELVIS W CONTRAST  Result Date: 10/25/2019 CLINICAL DATA:  Abdominal abscess/infection suspected EXAM: CT ABDOMEN AND PELVIS WITH CONTRAST TECHNIQUE: Multidetector CT imaging of the abdomen and pelvis was performed using the standard protocol following bolus administration of intravenous contrast. CONTRAST:  OMNIPAQUE IOHEXOL 300 MG/ML  SOLN COMPARISON:  07/05/2019 noncontrast abdominal CT FINDINGS: Lower chest: Atelectasis or scarring at the lung bases, also seen on prior. Hepatobiliary: Hepatic steatosis which is marked.Layering gallstone. No detected acute inflammation or gallbladder over distension. No bile duct dilatation. Pancreas: Unremarkable. Spleen: Nonspecific subcapsular low-density in the upper  spleen, likely subtly present on prior. In isolation, usually these lesions are incidental and noncontributory. Adrenals/Urinary Tract: Negative adrenals. No hydronephrosis or stone. Unremarkable bladder. Stomach/Bowel:  No obstruction. No evidence of bowel inflammation. Vascular/Lymphatic: No acute vascular abnormality. Multifocal atherosclerosis, notably extensive for age. No mass or adenopathy. Reproductive:Hysterectomy. Other: No ascites or pneumoperitoneum. Musculoskeletal: No acute abnormalities. IMPRESSION: 1. No acute finding. 2. Severe hepatic steatosis. 3. Cholelithiasis. 4. Atherosclerosis. Electronically Signed   By: Marnee Spring M.D.   On: 10/25/2019 07:09   DG Chest Port 1 View  Result Date: 10/25/2019 CLINICAL DATA:  Sepsis EXAM: PORTABLE CHEST 1 VIEW COMPARISON:  None. FINDINGS: The heart size and mediastinal contours are within normal limits. Shallow degree of aeration with subsegmental atelectasis is seen. There is also patchy airspace opacity seen within the right infrahilar region. Again noted is elevation of the right hemidiaphragm.  No acute osseous abnormality. IMPRESSION: Shallow degree of aeration with patchy airspace opacity at the right lung base which could be due to atelectasis and/or early infectious etiology. Electronically Signed   By: Jonna Clark M.D.   On: 10/25/2019 02:34        Scheduled Meds: Continuous Infusions: . cefTRIAXone (ROCEPHIN)  IV Stopped (10/25/19 0231)  . famotidine (PEPCID) IV 20 mg (10/25/19 0944)  . lactated ringers    . lactated ringers 150 mL/hr at 10/25/19 0942     LOS: 0 days    Time spent: Additional 30 minutes    Dorcas Carrow, MD [facility] Pager [phone]

## 2019-10-25 NOTE — ED Notes (Addendum)
Attempted to call report x2, nurse states that she will need another 10 minutes.

## 2019-10-25 NOTE — ED Notes (Addendum)
Attempted to call report but floor nurse was in a patient's room.  Will call back.

## 2019-10-25 NOTE — Sepsis Progress Note (Signed)
Lactic acid rising. Appropriate amount of IVF resuscitation and antibiotics given. Repeat lactic acids ordered for follow up.

## 2019-10-25 NOTE — ED Notes (Signed)
CRITICAL VALUE ALERT  Critical Value: 2.6 lactic acid   Date & Time Notied:  10/25/2019 0900  Provider Notified: Jerral Ralph, MD  Orders Received/Actions taken: New orders

## 2019-10-25 NOTE — ED Provider Notes (Signed)
[facility]-EMERGENCY DEPT Provider Note   CSN: [id number] Arrival date & time: 10/24/19  1757     History Chief Complaint  Patient presents with  . Hematuria    Lydia Vasquez is a 59 y.o. female.  Patient presents to the emergency department for evaluation of hematuria.  Patient reports that she has noticed blood in her urine.  She has not noticed any fever but has felt "warm" intermittently.  She has had some nausea and "gagging".  Patient denies abdominal pain.  She reports that this is similar to what she had in June when she had a bad urinary tract infection.        Past Medical History:  Diagnosis Date  . Anxiety   . Depression   . Hyperlipidemia   . Hypertension     Patient Active Problem List   Diagnosis Date Noted  . Sepsis due to urinary tract infection (HCC) 10/25/2019  . Hypokalemia 10/25/2019    Past Surgical History:  Procedure Laterality Date  . ABDOMINAL HYSTERECTOMY       OB History   No obstetric history on file.     History reviewed. No pertinent family history.  Social History   Tobacco Use  . Smoking status: Not on file  Substance Use Topics  . Alcohol use: Not on file  . Drug use: Not on file    Home Medications Prior to Admission medications   Medication Sig Start Date End Date Taking? Authorizing Provider  potassium chloride (KLOR-CON) 10 MEQ tablet Take 4 tablets (40 mEq total) by mouth daily for 5 days. 07/05/19 07/10/19  Dietrich Pates, PA-C    Allergies    Patient has no known allergies.  Review of Systems   Review of Systems  Gastrointestinal: Positive for nausea.  Genitourinary: Positive for hematuria.  All other systems reviewed and are negative.   Physical Exam Updated Vital Signs BP 131/87   Pulse (!) 112   Temp 98.2 F (36.8 C) (Rectal)   Resp (!) 27   Ht 5\' 11"  (1.803 m)   Wt 103.2 kg   SpO2 94%   BMI 31.73 kg/m   Physical Exam Vitals and nursing note reviewed.  Constitutional:       General: She is not in acute distress.    Appearance: Normal appearance. She is well-developed.  HENT:     Head: Normocephalic and atraumatic.     Right Ear: Hearing normal.     Left Ear: Hearing normal.     Nose: Nose normal.  Eyes:     Conjunctiva/sclera: Conjunctivae normal.     Pupils: Pupils are equal, round, and reactive to light.  Cardiovascular:     Rate and Rhythm: Regular rhythm. Tachycardia present.     Heart sounds: S1 normal and S2 normal. No murmur heard.  No friction rub. No gallop.   Pulmonary:     Effort: Pulmonary effort is normal. No respiratory distress.     Breath sounds: Normal breath sounds.  Chest:     Chest wall: No tenderness.  Abdominal:     General: Bowel sounds are normal.     Palpations: Abdomen is soft.     Tenderness: There is no abdominal tenderness. There is no guarding or rebound. Negative signs include Murphy's sign and McBurney's sign.     Hernia: No hernia is present.  Musculoskeletal:        General: Normal range of motion.     Cervical back: Normal range of motion and  neck supple.  Skin:    General: Skin is warm and dry.     Findings: No rash.  Neurological:     Mental Status: She is alert and oriented to person, place, and time.     GCS: GCS eye subscore is 4. GCS verbal subscore is 5. GCS motor subscore is 6.     Cranial Nerves: No cranial nerve deficit.     Sensory: No sensory deficit.     Coordination: Coordination normal.  Psychiatric:        Speech: Speech normal.        Behavior: Behavior normal.        Thought Content: Thought content normal.     ED Results / Procedures / Treatments   Labs (all labs ordered are listed, but only abnormal results are displayed) Labs Reviewed  URINALYSIS, ROUTINE W REFLEX MICROSCOPIC - Abnormal; Notable for the following components:      Result Value   Color, Urine BROWN (*)    APPearance CLOUDY (*)    Glucose, UA 50 (*)    Hgb urine dipstick MODERATE (*)    Bilirubin Urine  MODERATE (*)    Protein, ur 100 (*)    Nitrite POSITIVE (*)    Bacteria, UA MANY (*)    All other components within normal limits  CBC WITH DIFFERENTIAL/PLATELET - Abnormal; Notable for the following components:   WBC 22.8 (*)    RBC 3.56 (*)    HCT 35.3 (*)    MCH 34.6 (*)    RDW 19.0 (*)    Platelets 459 (*)    Neutro Abs 18.7 (*)    Abs Immature Granulocytes 0.13 (*)    All other components within normal limits  BASIC METABOLIC PANEL - Abnormal; Notable for the following components:   Potassium 3.1 (*)    Chloride 90 (*)    Glucose, Bld 121 (*)    Anion gap 19 (*)    All other components within normal limits  LACTIC ACID, PLASMA - Abnormal; Notable for the following components:   Lactic Acid, Venous 3.6 (*)    All other components within normal limits  RESPIRATORY PANEL BY RT PCR (FLU A&B, COVID)  URINE CULTURE  CULTURE, BLOOD (SINGLE)  LACTIC ACID, PLASMA    EKG None  Radiology DG Chest Port 1 View  Result Date: 10/25/2019 CLINICAL DATA:  Sepsis EXAM: PORTABLE CHEST 1 VIEW COMPARISON:  None. FINDINGS: The heart size and mediastinal contours are within normal limits. Shallow degree of aeration with subsegmental atelectasis is seen. There is also patchy airspace opacity seen within the right infrahilar region. Again noted is elevation of the right hemidiaphragm. No acute osseous abnormality. IMPRESSION: Shallow degree of aeration with patchy airspace opacity at the right lung base which could be due to atelectasis and/or early infectious etiology. Electronically Signed   By: Jonna Clark M.D.   On: 10/25/2019 02:34    Procedures Procedures (including critical care time)  Medications Ordered in ED Medications  lactated ringers infusion ( Intravenous New Bag/Given 10/25/19 0239)  cefTRIAXone (ROCEPHIN) 1 g in sodium chloride 0.9 % 100 mL IVPB (0 g Intravenous Stopped 10/25/19 0256)  sodium chloride 0.9 % bolus 500 mL (0 mLs Intravenous Stopped 10/25/19 0349)  lactated  ringers bolus 1,000 mL (0 mLs Intravenous Stopped 10/25/19 0349)    And  lactated ringers bolus 1,000 mL (0 mLs Intravenous Stopped 10/25/19 0349)    And  lactated ringers bolus 1,000 mL (0 mLs Intravenous Stopped  10/25/19 0349)    And  lactated ringers bolus 500 mL (0 mLs Intravenous Stopped 10/25/19 0448)  azithromycin (ZITHROMAX) 500 mg in sodium chloride 0.9 % 250 mL IVPB (500 mg Intravenous New Bag/Given 10/25/19 0344)    ED Course  I have reviewed the triage vital signs and the nursing notes.  Pertinent labs & imaging results that were available during my care of the patient were reviewed by me and considered in my medical decision making (see chart for details).    MDM Rules/Calculators/A&P                          Patient presents to the emergency department for evaluation of hematuria.  Patient reports that she had a urinary tract infection with similar symptoms in June of this year.  She was not febrile at arrival but was noted to be mildly tachycardic.  Labs were sent to evaluate for possible urinary tract infection.  She did have a significantly elevated white blood cell count and a lactic acid that was elevated.  At this point it was felt that she was likely septic and was initiated on treatment with Rocephin, IV fluids.  Chest x-ray was also obtained and does show possibility of an infiltrate.  Zithromax was added.  Patient continues to do well here in the ED, no hypotension or shock.  Will require hospitalization.  CRITICAL CARE Performed by: Gilda Crease   Total critical care time: 35 minutes  Critical care time was exclusive of separately billable procedures and treating other patients.  Critical care was necessary to treat or prevent imminent or life-threatening deterioration.  Critical care was time spent personally by me on the following activities: development of treatment plan with patient and/or surrogate as well as nursing, discussions with consultants,  evaluation of patient's response to treatment, examination of patient, obtaining history from patient or surrogate, ordering and performing treatments and interventions, ordering and review of laboratory studies, ordering and review of radiographic studies, pulse oximetry and re-evaluation of patient's condition.   Final Clinical Impression(s) / ED Diagnoses Final diagnoses:  Sepsis secondary to UTI [facility])    Rx / DC Orders ED Discharge Orders    None       Swara Donze, Canary Brim, MD 10/25/19 [phone]

## 2019-10-25 NOTE — H&P (Signed)
History and Physical    Lydia Vasquez DGU:[id number] DOB: 03-Oct-1960 DOA: 10/25/2019  PCP: Patient, No Pcp Per   Patient coming from: Home   Chief Complaint: Hematuria, nausea, fatigue, back pain   HPI: Lydia Vasquez is a 59 y.o. female with medical history significant for cervical cancer status post hysterectomy several years ago, depression, anxiety, hypertension, and hyperlipidemia, now presenting to the emergency department for evaluation of hematuria, fatigue, nausea, and back pain.  Patient reports several days of gross hematuria with progressive fatigue, nausea without vomiting, and back pain.  Patient reports that bilateral mid back pain has been present for a while and she is unsure if it is related to hematuria that developed a few days ago and has been associated with progressive fatigue and nausea.  She reports that the symptoms are essentially the same as what she was experiencing back in June when she had a UTI.  She denies any chills or shaking.  She denies any shortness of breath any significant cough.  ED Course: Upon arrival to the ED, patient is found to be afebrile, saturating mid 90s on room air, slightly tachycardic, and with stable blood pressure.  Chest x-ray features atelectasis versus early infection at the right base.  Chemistry panel notable for potassium 3.1.  CBC with leukocytosis to 22,800 and a mild thrombocytosis.  Lactic acid is elevated to 3.6.  Covid PCR is negative.  Urinalysis is nitrite positive.  Blood and urine cultures were collected in the ED, 30 cc/kg LR bolus was administered, and the patient was treated with Rocephin.  Review of Systems:  All other systems reviewed and apart from HPI, are negative.  Past Medical History:  Diagnosis Date  . Anxiety   . Depression   . Hyperlipidemia   . Hypertension     Past Surgical History:  Procedure Laterality Date  . ABDOMINAL HYSTERECTOMY      Social History:   has no history on file for tobacco use,  alcohol use, and drug use.  No Known Allergies  History reviewed. No pertinent family history.   Prior to Admission medications   Medication Sig Start Date End Date Taking? Authorizing Provider  potassium chloride (KLOR-CON) 10 MEQ tablet Take 4 tablets (40 mEq total) by mouth daily for 5 days. 07/05/19 07/10/19  Dietrich Pates, PA-C    Physical Exam: Vitals:   10/25/19 0330 10/25/19 0345 10/25/19 0400 10/25/19 0445  BP: 131/84 (!) 141/95 (!) 137/115 131/87  Pulse: (!) 101 (!) 105 (!) 105 (!) 112  Resp: (!) 21 20 (!) 24 (!) 27  Temp:      TempSrc:      SpO2: 91% 96% 91% 94%  Weight:      Height:        Constitutional: NAD, calm  Eyes: PERTLA, lids and conjunctivae normal ENMT: Mucous membranes are moist. Posterior pharynx clear of any exudate or lesions.   Neck: normal, supple, no masses, no thyromegaly Respiratory:  no wheezing, no crackles. No accessory muscle use.  Cardiovascular: S1 & S2 heard, regular rate and rhythm. No extremity edema.  Abdomen: No distension, no tenderness, soft. Bowel sounds active.  Musculoskeletal: no clubbing / cyanosis. No joint deformity upper and lower extremities.   Skin: no significant rashes, lesions, ulcers. Warm, dry, well-perfused. Neurologic: CN 2-12 grossly intact. Sensation intact. Moving all extremities.  Psychiatric: Alert and oriented to person, place, and situation. Pleasant and cooperative.    Labs and Imaging on Admission: I have personally reviewed following labs and  imaging studies  CBC: Recent Labs  Lab 10/25/19 0039  WBC 22.8*  NEUTROABS 18.7*  HGB 12.3  HCT 35.3*  MCV 99.2  PLT 459*   Basic Metabolic Panel: Recent Labs  Lab 10/25/19 0039  NA 136  K 3.1*  CL 90*  CO2 27  GLUCOSE 121*  BUN 10  CREATININE 0.58  CALCIUM 9.2   GFR: Estimated Creatinine Clearance: 102.6 mL/min (by C-G formula based on SCr of 0.58 mg/dL). Liver Function Tests: No results for input(s): AST, ALT, ALKPHOS, BILITOT, PROT, ALBUMIN  in the last 168 hours. No results for input(s): LIPASE, AMYLASE in the last 168 hours. No results for input(s): AMMONIA in the last 168 hours. Coagulation Profile: No results for input(s): INR, PROTIME in the last 168 hours. Cardiac Enzymes: No results for input(s): CKTOTAL, CKMB, CKMBINDEX, TROPONINI in the last 168 hours. BNP (last 3 results) No results for input(s): PROBNP in the last 8760 hours. HbA1C: No results for input(s): HGBA1C in the last 72 hours. CBG: No results for input(s): GLUCAP in the last 168 hours. Lipid Profile: No results for input(s): CHOL, HDL, LDLCALC, TRIG, CHOLHDL, LDLDIRECT in the last 72 hours. Thyroid Function Tests: No results for input(s): TSH, T4TOTAL, FREET4, T3FREE, THYROIDAB in the last 72 hours. Anemia Panel: No results for input(s): VITAMINB12, FOLATE, FERRITIN, TIBC, IRON, RETICCTPCT in the last 72 hours. Urine analysis:    Component Value Date/Time   COLORURINE BROWN (A) 10/25/2019 0026   APPEARANCEUR CLOUDY (A) 10/25/2019 0026   LABSPEC 1.021 10/25/2019 0026   PHURINE 5.0 10/25/2019 0026   GLUCOSEU 50 (A) 10/25/2019 0026   HGBUR MODERATE (A) 10/25/2019 0026   BILIRUBINUR MODERATE (A) 10/25/2019 0026   KETONESUR NEGATIVE 10/25/2019 0026   PROTEINUR 100 (A) 10/25/2019 0026   NITRITE POSITIVE (A) 10/25/2019 0026   LEUKOCYTESUR NEGATIVE 10/25/2019 0026   Sepsis Labs: @LABRCNTIP (procalcitonin:4,lacticidven:4) ) Recent Results (from the past 240 hour(s))  Respiratory Panel by RT PCR (Flu A&B, Covid) - Nasopharyngeal Swab     Status: None   Collection Time: 10/25/19  3:45 AM   Specimen: Nasopharyngeal Swab  Result Value Ref Range Status   SARS Coronavirus 2 by RT PCR NEGATIVE NEGATIVE Final    Comment: (NOTE) SARS-CoV-2 target nucleic acids are NOT DETECTED.  The SARS-CoV-2 RNA is generally detectable in upper respiratoy specimens during the acute phase of infection. The lowest concentration of SARS-CoV-2 viral copies this assay can  detect is 131 copies/mL. A negative result does not preclude SARS-Cov-2 infection and should not be used as the sole basis for treatment or other patient management decisions. A negative result may occur with  improper specimen collection/handling, submission of specimen other than nasopharyngeal swab, presence of viral mutation(s) within the areas targeted by this assay, and inadequate number of viral copies (<131 copies/mL). A negative result must be combined with clinical observations, patient history, and epidemiological information. The expected result is Negative.  Fact Sheet for Patients:  12/25/19  Fact Sheet for Healthcare Providers:  https://www.moore.com/  This test is no t yet approved or cleared by the https://www.young.biz/ FDA and  has been authorized for detection and/or diagnosis of SARS-CoV-2 by FDA under an Emergency Use Authorization (EUA). This EUA will remain  in effect (meaning this test can be used) for the duration of the COVID-19 declaration under Section 564(b)(1) of the Act, 21 U.S.C. section 360bbb-3(b)(1), unless the authorization is terminated or revoked sooner.     Influenza A by PCR NEGATIVE NEGATIVE Final  Influenza B by PCR NEGATIVE NEGATIVE Final    Comment: (NOTE) The Xpert Xpress SARS-CoV-2/FLU/RSV assay is intended as an aid in  the diagnosis of influenza from Nasopharyngeal swab specimens and  should not be used as a sole basis for treatment. Nasal washings and  aspirates are unacceptable for Xpert Xpress SARS-CoV-2/FLU/RSV  testing.  Fact Sheet for Patients: https://www.moore.com/  Fact Sheet for Healthcare Providers: https://www.young.biz/  This test is not yet approved or cleared by the [place] FDA and  has been authorized for detection and/or diagnosis of SARS-CoV-2 by  FDA under an Emergency Use Authorization (EUA). This EUA will remain  in  effect (meaning this test can be used) for the duration of the  Covid-19 declaration under Section 564(b)(1) of the Act, 21  U.S.C. section 360bbb-3(b)(1), unless the authorization is  terminated or revoked. Performed at [facility], [address], [place], [place] [address]      Radiological Exams on Admission: DG Chest Port 1 View  Result Date: 10/25/2019 CLINICAL DATA:  Sepsis EXAM: PORTABLE CHEST 1 VIEW COMPARISON:  None. FINDINGS: The heart size and mediastinal contours are within normal limits. Shallow degree of aeration with subsegmental atelectasis is seen. There is also patchy airspace opacity seen within the right infrahilar region. Again noted is elevation of the right hemidiaphragm. No acute osseous abnormality. IMPRESSION: Shallow degree of aeration with patchy airspace opacity at the right lung base which could be due to atelectasis and/or early infectious etiology. Electronically Signed   By: Jonna Clark M.D.   On: 10/25/2019 02:34    Assessment/Plan   1. Sepsis secondary to UTI  - Presents with fatigue, nausea, back pain, and hematuria and is found to have marked leukocytosis, tachycardia, UA compatible with infection, lactate of 3.1, and stable BP  - Blood and urine cultures were collected in ED, 30 cc/kg LR bolus was given, and she was started on Rocephin  - Trend lactate, continue Rocephin, follow cultures and clinical course   2. Hypokalemia  - Serum potassium is 3.1 in ED  - Replace, repeat chem panel in am    DVT prophylaxis: Lovenox  Code Status: Full  Family Communication: Discussed with patient  Disposition Plan:  Patient is from: Home  Anticipated d/c is to: Home  Anticipated d/c date is: 10/28/19 Patient currently: Septic  Consults called: None  Admission status: Inpatient     Briscoe Deutscher, MD [facility]  10/25/2019, 5:27 AM

## 2019-10-26 ENCOUNTER — Ambulatory Visit (HOSPITAL_COMMUNITY)
Admit: 2019-10-26 | Discharge: 2019-10-26 | Disposition: A | Discharge status: Home / Self Care | Payer: Medicaid Other | Attending: Family Medicine | Admitting: Family Medicine

## 2019-10-26 DIAGNOSIS — A419 Sepsis, unspecified organism: Secondary | ICD-10-CM | POA: Diagnosis not present

## 2019-10-26 DIAGNOSIS — N390 Urinary tract infection, site not specified: Secondary | ICD-10-CM | POA: Diagnosis not present

## 2019-10-26 LAB — URINE CULTURE: Culture: >=100000 — AB

## 2019-10-26 LAB — CBC WITH DIFFERENTIAL/PLATELET
Abs Immature Granulocytes: 0.12 10*3/uL — ABNORMAL HIGH (ref 0.00–0.07)
Basophils Absolute: 0.1 10*3/uL (ref 0.0–0.1)
Basophils Relative: 1 %
Eosinophils Absolute: 0.1 10*3/uL (ref 0.0–0.5)
Eosinophils Relative: 1 %
HCT: 32.8 % — ABNORMAL LOW (ref 36.0–46.0)
Hemoglobin: 11 g/dL — ABNORMAL LOW (ref 12.0–15.0)
Immature Granulocytes: 1 %
Lymphocytes Relative: 17 %
Lymphs Abs: 2.6 10*3/uL (ref 0.7–4.0)
MCH: 34.6 pg — ABNORMAL HIGH (ref 26.0–34.0)
MCHC: 33.5 g/dL (ref 30.0–36.0)
MCV: 103.1 fL — ABNORMAL HIGH (ref 80.0–100.0)
Monocytes Absolute: 0.6 10*3/uL (ref 0.1–1.0)
Monocytes Relative: 4 %
Neutro Abs: 11.8 10*3/uL — ABNORMAL HIGH (ref 1.7–7.7)
Neutrophils Relative %: 76 %
Platelets: 397 10*3/uL (ref 150–400)
RBC: 3.18 MIL/uL — ABNORMAL LOW (ref 3.87–5.11)
RDW: 19.2 % — ABNORMAL HIGH (ref 11.5–15.5)
WBC: 15.3 10*3/uL — ABNORMAL HIGH (ref 4.0–10.5)
nRBC: 0 % (ref 0.0–0.2)

## 2019-10-26 LAB — PHOSPHORUS: Phosphorus: 2.7 mg/dL (ref 2.5–4.6)

## 2019-10-26 LAB — COMPREHENSIVE METABOLIC PANEL
ALT: 17 U/L (ref 0–44)
AST: 85 U/L — ABNORMAL HIGH (ref 15–41)
Albumin: 2.1 g/dL — ABNORMAL LOW (ref 3.5–5.0)
Alkaline Phosphatase: 140 U/L — ABNORMAL HIGH (ref 38–126)
Anion gap: 11 (ref 5–15)
BUN: <5 mg/dL — ABNORMAL LOW (ref 6–20)
CO2: 30 mmol/L (ref 22–32)
Calcium: 8.5 mg/dL — ABNORMAL LOW (ref 8.9–10.3)
Chloride: 98 mmol/L (ref 98–111)
Creatinine, Ser: 0.45 mg/dL (ref 0.44–1.00)
GFR calc non Af Amer: >60 mL/min (ref >60)
Glucose, Bld: 93 mg/dL (ref 70–99)
Potassium: 3.6 mmol/L (ref 3.5–5.1)
Sodium: 139 mmol/L (ref 135–145)
Total Bilirubin: 11.8 mg/dL — ABNORMAL HIGH (ref 0.3–1.2)
Total Protein: 6.5 g/dL (ref 6.5–8.1)

## 2019-10-26 LAB — LIPID PANEL
Cholesterol: 192 mg/dL (ref 0–200)
HDL: <10 mg/dL — ABNORMAL LOW (ref >40)
Triglycerides: 332 mg/dL — ABNORMAL HIGH (ref <150)
VLDL: 66 mg/dL — ABNORMAL HIGH (ref 0–40)

## 2019-10-26 LAB — HEPATITIS PANEL, ACUTE
HCV Ab: NONREACTIVE
Hep A IgM: NONREACTIVE
Hep B C IgM: NONREACTIVE
Hepatitis B Surface Ag: NONREACTIVE

## 2019-10-26 LAB — BILIRUBIN, FRACTIONATED(TOT/DIR/INDIR)
Bilirubin, Direct: 6.9 mg/dL — ABNORMAL HIGH (ref 0.0–0.2)
Indirect Bilirubin: 5.5 mg/dL — ABNORMAL HIGH (ref 0.3–0.9)
Total Bilirubin: 12.4 mg/dL — ABNORMAL HIGH (ref 0.3–1.2)

## 2019-10-26 LAB — MONONUCLEOSIS SCREEN: Mono Screen: NEGATIVE

## 2019-10-26 LAB — LACTIC ACID, PLASMA: Lactic Acid, Venous: 2.3 mmol/L (ref 0.5–1.9)

## 2019-10-26 LAB — MAGNESIUM: Magnesium: 1.8 mg/dL (ref 1.7–2.4)

## 2019-10-26 MED ORDER — CEFAZOLIN SODIUM-DEXTROSE 1-4 GM/50ML-% IV SOLN
1.0000 g | Freq: Three times a day (TID) | INTRAVENOUS | Status: AC
  Administered 2019-10-26 – 2019-10-30 (×14): 1 g via INTRAVENOUS
  Filled 2019-10-26 (×15): qty 50

## 2019-10-26 MED ORDER — GADOBUTROL 1 MMOL/ML IV SOLN
10.0000 mL | Freq: Once | INTRAVENOUS | Status: AC | PRN
  Administered 2019-10-26: 10 mL via INTRAVENOUS

## 2019-10-26 NOTE — Progress Notes (Addendum)
PROGRESS NOTE    Lydia Vasquez  NLZ:[id number] DOB: 1960/10/03 DOA: 10/25/2019 PCP: Lydia Vasquez, No Pcp Per   Chief Complaint  Lydia Vasquez presents with  . Hematuria    Brief Narrative:  59 year old female with history of cervical cancer status post hysterectomy, depression, anxiety, hypertension hyperlipidemia presented to the ER with hematuria, fatigue, nausea and back pain.  Several days of gross hematuria with progressive fatigue and nausea.  Lydia Vasquez also reported bilateral mid back pain. In the emergency room she was found afebrile, tachycardic, blood pressures were stable.  Chest x-ray with right lower lobe atelectasis.  Leukocytosis with white cell count of 22.8.  Lactic acid 3.6.  COVID-19 negative. Urinalysis with grossly abnormal urine.  CT scan with no hydronephrosis or collection.  Admitted and treated as severe sepsis due to UTI. Lydia Vasquez moved from [place] to [place] about 1 and half year ago and has not taken any of her blood pressure medicine or cholesterol medicines.  She had similar episode about 2 months ago did not need hospitalization.  Assessment & Plan:   Principal Problem:   Sepsis due to urinary tract infection ([place]) Active Problems:   Hypokalemia   Sepsis ([place])   UTI (urinary tract infection)  Severe sepsis secondary to UTI  E. Coli UTI: Present on admission. Received resuscitation fluid, lactic downtrending Urine cx with e. Coli sensitive to ancef, continue anitbiotics Follow blood cultures Sepsis physiology improved  Direct Hyperbilirubinemia  Jaundice  Hepatic Steatosis: Elevated bili and alk phos, mildly elevated AST - downtrending today RUQ [place] with cholelithiasis without findings c/w cholecystitis Follow monospot Follow MRCP Consult GI pending above  Hypokalemia: improved  Hypertension: BP stable, continue to monitor  Hyperlipidemia with hepatic steatosis: elevated triglycerides, LDL not calculated.  HDL <10.  Continue to  follow.  History of cervical cancer status post total hysterectomy: In 2012.  CT scan with no evidence of recurrent cancer or obstruction.  Abnormal CXR: repeat 10/7  DVT prophylaxis: lovenox Code Status: full code Family Communication: none at bedside - daughter Disposition:   Status is: Inpatient  Remains inpatient appropriate because:Inpatient level of care appropriate due to severity of illness   Dispo: The Lydia Vasquez is from: Home              Anticipated d/c is to: Home              Anticipated d/c date is: > 3 days              Lydia Vasquez currently is not medically stable to d/c. Consultants:   none  Procedures:   none  Antimicrobials: Anti-infectives (From admission, onward)   Start     Dose/Rate Route Frequency Ordered Stop   10/26/19 1200  ceFAZolin (ANCEF) IVPB 1 g/50 mL premix        1 g 100 mL/hr over 30 Minutes Intravenous Every 8 hours 10/26/19 1044     10/25/19 0330  azithromycin (ZITHROMAX) 500 mg in sodium chloride 0.9 % 250 mL IVPB        500 mg 250 mL/hr over 60 Minutes Intravenous  Once 10/25/19 0321 10/25/19 0617   10/25/19 0145  cefTRIAXone (ROCEPHIN) 1 g in sodium chloride 0.9 % 100 mL IVPB  Status:  Discontinued        1 g 200 mL/hr over 30 Minutes Intravenous Every 24 hours 10/25/19 0132 10/26/19 1024     Subjective: Feels tired overall  Objective: Vitals:   10/25/19 2354 10/26/19 0431 10/26/19 0803 10/26/19 1419  BP: 116/73 119/66 120/79 121/78  Pulse: (!) 101 (!) 101 (!) 101 (!) 105  Resp: '20 20 16 18  ' Temp: 98.1 F (36.7 C) 98.4 F (36.9 C) 98.5 F (36.9 C) 99 F (37.2 C)  TempSrc: Oral Oral Oral Oral  SpO2: 93% 92% 94% 95%  Weight:      Height:        Intake/Output Summary (Last 24 hours) at 10/26/2019 2026 Last data filed at 10/26/2019 1817 Gross per 24 hour  Intake 970 ml  Output --  Net 970 ml   Filed Weights   10/24/19 2011 10/25/19 2026  Weight: 103.2 kg 119.5 kg    Examination:  General exam: Appears calm and  comfortable  Icteric sclera Respiratory system: Clear to auscultation. Respiratory effort normal. Cardiovascular system: S1 & S2 heard, RRR.  Gastrointestinal system: Abdomen is nondistended, soft and nontender. Central nervous system: Alert and oriented. No focal neurological deficits. Extremities: no LEE Skin: No rashes, lesions or ulcers Psychiatry: Judgement and insight appear normal. Mood & affect appropriate.     Data Reviewed: I have personally reviewed following labs and imaging studies  CBC: Recent Labs  Lab 10/25/19 0039 10/26/19 0559  WBC 22.8* 15.3*  NEUTROABS 18.7* 11.8*  HGB 12.3 11.0*  HCT 35.3* 32.8*  MCV 99.2 103.1*  PLT 459* 031    Basic Metabolic Panel: Recent Labs  Lab 10/25/19 0039 10/25/19 1211 10/26/19 0559  NA 136  --  139  K 3.1*  --  3.6  CL 90*  --  98  CO2 27  --  30  GLUCOSE 121*  --  93  BUN 10  --  <5*  CREATININE 0.58  --  0.45  CALCIUM 9.2  --  8.5*  MG  --  1.6* 1.8  PHOS  --   --  2.7    GFR: Estimated Creatinine Clearance: 110.6 mL/min (by C-G formula based on SCr of 0.45 mg/dL).  Liver Function Tests: Recent Labs  Lab 10/25/19 0039 10/26/19 0559  AST 103* 85*  ALT 20 17  ALKPHOS 163* 140*  BILITOT 15.6* 12.4*  11.8*  PROT 8.4* 6.5  ALBUMIN 2.5* 2.1*    CBG: No results for input(s): GLUCAP in the last 168 hours.   Recent Results (from the past 240 hour(s))  Urine Culture     Status: Abnormal   Collection Time: 10/25/19 12:39 AM   Specimen: Urine, Clean Catch  Result Value Ref Range Status   Specimen Description   Final    URINE, CLEAN CATCH Performed at [facility], [place] [address], [place], [place] [address]    Special Requests   Final    NONE Performed at [facility], [place] [address] [place], [place] [address]    Culture >=100,000 COLONIES/mL ESCHERICHIA COLI (Latonya Knight)  Final   Report Status 10/26/2019 FINAL  Final   Organism ID, Bacteria ESCHERICHIA COLI (Ariana Cavenaugh)  Final       Susceptibility   Escherichia coli - MIC*    AMPICILLIN >=32 RESISTANT Resistant     CEFAZOLIN <=4 SENSITIVE Sensitive     CEFTRIAXONE <=0.25 SENSITIVE Sensitive     CIPROFLOXACIN >=4 RESISTANT Resistant     GENTAMICIN >=16 RESISTANT Resistant     IMIPENEM <=0.25 SENSITIVE Sensitive     NITROFURANTOIN <=16 SENSITIVE Sensitive     TRIMETH/SULFA <=20 SENSITIVE Sensitive     AMPICILLIN/SULBACTAM 16 INTERMEDIATE Intermediate     PIP/TAZO <=4 SENSITIVE Sensitive     * >=  100,000 COLONIES/mL ESCHERICHIA COLI  Culture, blood (single)     Status: None (Preliminary result)   Collection Time: 10/25/19  1:59 AM   Specimen: BLOOD  Result Value Ref Range Status   Specimen Description   Final    BLOOD RIGHT ANTECUBITAL Performed at [place] [address] [place], [place] [address]    Special Requests   Final    BOTTLES DRAWN AEROBIC AND ANAEROBIC Blood Culture adequate volume Performed at [place] [address] [place], [place] [address]    Culture   Final    NO GROWTH 1 DAY Performed at [facility], [place] [address], [place], [place] [address]    Report Status PENDING  Incomplete  Respiratory Panel by RT PCR (Flu Mollyann Halbert&B, Covid) - Nasopharyngeal Swab     Status: None   Collection Time: 10/25/19  3:45 AM   Specimen: Nasopharyngeal Swab  Result Value Ref Range Status   SARS Coronavirus 2 by RT PCR NEGATIVE NEGATIVE Final    Comment: (NOTE) SARS-CoV-2 target nucleic acids are NOT DETECTED.  The SARS-CoV-2 RNA is generally detectable in upper respiratoy specimens during the acute phase of infection. The lowest concentration of SARS-CoV-2 viral copies this assay can detect is 131 copies/mL. Sanda Dejoy negative result does not preclude SARS-Cov-2 infection and should not be used as the sole basis for treatment or other Lydia Vasquez management decisions. Floree Zuniga negative result may occur with  improper specimen collection/handling, submission of specimen  other than nasopharyngeal swab, presence of viral mutation(s) within the areas targeted by this assay, and inadequate number of viral copies (<131 copies/mL). Lukka Black negative result must be combined with clinical observations, Lydia Vasquez history, and epidemiological information. The expected result is Negative.  Fact Sheet for Patients:  PinkCheek.be  Fact Sheet for Healthcare Providers:  GravelBags.it  This test is no t yet approved or cleared by the [place] FDA and  has been authorized for detection and/or diagnosis of SARS-CoV-2 by FDA under an Emergency Use Authorization (EUA). This EUA will remain  in effect (meaning this test can be used) for the duration of the COVID-19 declaration under Section 564(b)(1) of the Act, 21 U.S.C. section 360bbb-3(b)(1), unless the authorization is terminated or revoked sooner.     Influenza Willistine Ferrall by PCR NEGATIVE NEGATIVE Final   Influenza B by PCR NEGATIVE NEGATIVE Final    Comment: (NOTE) The Xpert Xpress SARS-CoV-2/FLU/RSV assay is intended as an aid in  the diagnosis of influenza from Nasopharyngeal swab specimens and  should not be used as Mayling Aber sole basis for treatment. Nasal washings and  aspirates are unacceptable for Xpert Xpress SARS-CoV-2/FLU/RSV  testing.  Fact Sheet for Patients: PinkCheek.be  Fact Sheet for Healthcare Providers: GravelBags.it  This test is not yet approved or cleared by the [place] FDA and  has been authorized for detection and/or diagnosis of SARS-CoV-2 by  FDA under an Emergency Use Authorization (EUA). This EUA will remain  in effect (meaning this test can be used) for the duration of the  Covid-19 declaration under Section 564(b)(1) of the Act, 21  U.S.C. section 360bbb-3(b)(1), unless the authorization is  terminated or revoked. Performed at [facility], [place] [address], [place],  [address]          Radiology Studies: CT ABDOMEN PELVIS W CONTRAST  Result Date: 10/25/2019 CLINICAL DATA:  Abdominal abscess/infection suspected EXAM: CT ABDOMEN AND PELVIS WITH CONTRAST TECHNIQUE: Multidetector CT imaging of the abdomen and pelvis was  performed using the standard protocol following bolus administration of intravenous contrast. CONTRAST:  154m OMNIPAQUE IOHEXOL 300 MG/ML  SOLN COMPARISON:  07/05/2019 noncontrast abdominal CT FINDINGS: Lower chest: Atelectasis or scarring at the lung bases, also seen on prior. Hepatobiliary: Hepatic steatosis which is marked.Layering gallstone. No detected acute inflammation or gallbladder over distension. No bile duct dilatation. Pancreas: Unremarkable. Spleen: Nonspecific subcapsular low-density in the upper spleen, likely subtly present on prior. In isolation, usually these lesions are incidental and noncontributory. Adrenals/Urinary Tract: Negative adrenals. No hydronephrosis or stone. Unremarkable bladder. Stomach/Bowel:  No obstruction. No evidence of bowel inflammation. Vascular/Lymphatic: No acute vascular abnormality. Multifocal atherosclerosis, notably extensive for age. No mass or adenopathy. Reproductive:Hysterectomy. Other: No ascites or pneumoperitoneum. Musculoskeletal: No acute abnormalities. IMPRESSION: 1. No acute finding. 2. Severe hepatic steatosis. 3. Cholelithiasis. 4. Atherosclerosis. Electronically Signed   By: JMonte FantasiaM.D.   On: 10/25/2019 07:09   DG Chest Port 1 View  Result Date: 10/25/2019 CLINICAL DATA:  Sepsis EXAM: PORTABLE CHEST 1 VIEW COMPARISON:  None. FINDINGS: The heart size and mediastinal contours are within normal limits. Shallow degree of aeration with subsegmental atelectasis is seen. There is also patchy airspace opacity seen within the right infrahilar region. Again noted is elevation of the right hemidiaphragm. No acute osseous abnormality. IMPRESSION: Shallow degree of aeration  with patchy airspace opacity at the right lung base which could be due to atelectasis and/or early infectious etiology. Electronically Signed   By: BPrudencio PairM.D.   On: 10/25/2019 02:34   UKoreaAbdomen Limited RUQ  Result Date: 10/25/2019 CLINICAL DATA:  Right upper quadrant abdominal pain EXAM: ULTRASOUND ABDOMEN LIMITED RIGHT UPPER QUADRANT COMPARISON:  CT scan 10/25/2019 FINDINGS: Gallbladder: Numerous small echogenic shadowing gallstones noted dependently in the gallbladder. No gallbladder wall thickening, pericholecystic fluid or sonographic Murphy sign to suggest acute cholecystitis. The largest calculus measures 1 cm. Common bile duct: Diameter: 6.0 mm Liver: There is diffuse increased echogenicity of the liver and decreased through transmission consistent with fatty infiltration. No focal lesions or biliary dilatation. Portal vein is patent on color Doppler imaging with normal direction of blood flow towards the liver. Other: None. IMPRESSION: 1. Cholelithiasis without sonographic findings for acute cholecystitis. 2. No intra or extrahepatic biliary dilatation. 3. Advanced fatty infiltration of the liver. Electronically Signed   By: PMarijo SanesM.D.   On: 10/25/2019 16:32        Scheduled Meds: . enoxaparin (LOVENOX) injection  50 mg Subcutaneous Q24H  . sodium chloride flush  3 mL Intravenous Q12H   Continuous Infusions: .  ceFAZolin (ANCEF) IV 1 g (10/26/19 1256)  . famotidine (PEPCID) IV 20 mg (10/26/19 0928)     LOS: 1 day    Time spent: over 328min    CFayrene Helper MD [facility]   To contact the attending provider between 7A-7P or the covering provider during after hours 7P-7A, please log into the web site [email or web address] and access using universal [place] password for that web site. If you do not have the password, please call the hospital operator.  10/26/2019, 8:26 PM

## 2019-10-26 NOTE — TOC Progression Note (Signed)
Transition of Care [facility]) - Progression Note    Patient Details  Name: Lydia Vasquez MRN: [id number] Date of Birth: 04-08-1960  Transition of [facility] CM/SW Contact  Geni Bers, RN Phone Number: 10/26/2019, 12:24 PM  Clinical Narrative:     Pt from home with children and plan to return home when stable.   Expected Discharge Plan: Home/Self Care Barriers to Discharge: No Barriers Identified  Expected Discharge Plan and Services Expected Discharge Plan: Home/Self Care       Living arrangements for the past 2 months: Single Family Home                                       Social Determinants of Health (SDOH) Interventions    Readmission Risk Interventions No flowsheet data found.

## 2019-10-26 NOTE — Progress Notes (Signed)
Pharmacy Antibiotic Note  Lydia Vasquez is a 59 y.o. female admitted on 10/25/2019 with UTI.  Pharmacy has been consulted for Ancef dosing for Ecoli UTI  Plan: Ancef 1g IV q8 Will sign off  Height: 5\' 11"  (180.3 cm) Weight: 119.5 kg (263 lb 7.2 oz) IBW/kg (Calculated) : 70.8  Temp (24hrs), Avg:98.3 F (36.8 C), Min:98.1 F (36.7 C), Max:98.5 F (36.9 C)  Recent Labs  Lab 10/25/19 0039 10/25/19 0039 10/25/19 0502 10/25/19 0800 10/25/19 1211 10/25/19 2054 10/26/19 0559  WBC 22.8*  --   --   --   --   --  15.3*  CREATININE 0.58  --   --   --   --   --  0.45  LATICACIDVEN 3.6*   < > 4.1* 2.6* 2.3* 2.7* 2.3*   < > = values in this interval not displayed.    Estimated Creatinine Clearance: 110.6 mL/min (by C-G formula based on SCr of 0.45 mg/dL).    No Known Allergies    Thank you for allowing pharmacy to be a part of this patient's care.  12/26/19 10/26/2019 10:44 AM

## 2019-10-27 ENCOUNTER — Inpatient Hospital Stay (HOSPITAL_COMMUNITY): Payer: Medicaid Other

## 2019-10-27 DIAGNOSIS — A419 Sepsis, unspecified organism: Secondary | ICD-10-CM | POA: Diagnosis not present

## 2019-10-27 DIAGNOSIS — J90 Pleural effusion, not elsewhere classified: Secondary | ICD-10-CM | POA: Diagnosis not present

## 2019-10-27 DIAGNOSIS — R9389 Abnormal findings on diagnostic imaging of other specified body structures: Secondary | ICD-10-CM | POA: Diagnosis not present

## 2019-10-27 DIAGNOSIS — N390 Urinary tract infection, site not specified: Secondary | ICD-10-CM | POA: Diagnosis not present

## 2019-10-27 LAB — CBC WITH DIFFERENTIAL/PLATELET
Abs Immature Granulocytes: 0.18 10*3/uL — ABNORMAL HIGH (ref 0.00–0.07)
Basophils Absolute: 0.1 10*3/uL (ref 0.0–0.1)
Basophils Relative: 1 %
Eosinophils Absolute: 0.1 10*3/uL (ref 0.0–0.5)
Eosinophils Relative: 1 %
HCT: 30.1 % — ABNORMAL LOW (ref 36.0–46.0)
Hemoglobin: 10.1 g/dL — ABNORMAL LOW (ref 12.0–15.0)
Immature Granulocytes: 1 %
Lymphocytes Relative: 16 %
Lymphs Abs: 2.7 10*3/uL (ref 0.7–4.0)
MCH: 34.2 pg — ABNORMAL HIGH (ref 26.0–34.0)
MCHC: 33.6 g/dL (ref 30.0–36.0)
MCV: 102 fL — ABNORMAL HIGH (ref 80.0–100.0)
Monocytes Absolute: 0.8 10*3/uL (ref 0.1–1.0)
Monocytes Relative: 5 %
Neutro Abs: 12.5 10*3/uL — ABNORMAL HIGH (ref 1.7–7.7)
Neutrophils Relative %: 76 %
Platelets: 385 10*3/uL (ref 150–400)
RBC: 2.95 MIL/uL — ABNORMAL LOW (ref 3.87–5.11)
RDW: 19.4 % — ABNORMAL HIGH (ref 11.5–15.5)
WBC: 16.4 10*3/uL — ABNORMAL HIGH (ref 4.0–10.5)
nRBC: 0 % (ref 0.0–0.2)

## 2019-10-27 LAB — COMPREHENSIVE METABOLIC PANEL
ALT: 18 U/L (ref 0–44)
AST: 80 U/L — ABNORMAL HIGH (ref 15–41)
Albumin: 2.1 g/dL — ABNORMAL LOW (ref 3.5–5.0)
Alkaline Phosphatase: 138 U/L — ABNORMAL HIGH (ref 38–126)
Anion gap: 12 (ref 5–15)
BUN: 5 mg/dL — ABNORMAL LOW (ref 6–20)
CO2: 28 mmol/L (ref 22–32)
Calcium: 8.6 mg/dL — ABNORMAL LOW (ref 8.9–10.3)
Chloride: 97 mmol/L — ABNORMAL LOW (ref 98–111)
Creatinine, Ser: 0.41 mg/dL — ABNORMAL LOW (ref 0.44–1.00)
GFR calc non Af Amer: >60 mL/min (ref >60)
Glucose, Bld: 104 mg/dL — ABNORMAL HIGH (ref 70–99)
Potassium: 2.9 mmol/L — ABNORMAL LOW (ref 3.5–5.1)
Sodium: 137 mmol/L (ref 135–145)
Total Bilirubin: 11.9 mg/dL — ABNORMAL HIGH (ref 0.3–1.2)
Total Protein: 6.7 g/dL (ref 6.5–8.1)

## 2019-10-27 LAB — PROTIME-INR
INR: 1.5 — ABNORMAL HIGH (ref 0.8–1.2)
Prothrombin Time: 17.4 seconds — ABNORMAL HIGH (ref 11.4–15.2)

## 2019-10-27 LAB — MAGNESIUM: Magnesium: 1.7 mg/dL (ref 1.7–2.4)

## 2019-10-27 MED ORDER — MAGNESIUM SULFATE 2 GM/50ML IV SOLN
2.0000 g | Freq: Once | INTRAVENOUS | Status: AC
  Administered 2019-10-27: 2 g via INTRAVENOUS
  Filled 2019-10-27: qty 50

## 2019-10-27 MED ORDER — POTASSIUM CHLORIDE CRYS ER 20 MEQ PO TBCR
40.0000 meq | EXTENDED_RELEASE_TABLET | ORAL | Status: AC
  Administered 2019-10-27 (×2): 40 meq via ORAL
  Filled 2019-10-27 (×2): qty 2

## 2019-10-27 MED ORDER — ENOXAPARIN SODIUM 60 MG/0.6ML ~~LOC~~ SOLN
60.0000 mg | SUBCUTANEOUS | Status: DC
  Administered 2019-10-27 – 2019-10-29 (×3): 60 mg via SUBCUTANEOUS
  Filled 2019-10-27 (×3): qty 0.6

## 2019-10-27 NOTE — Consult Note (Signed)
Subjective:   HPI  The patient is a 59 year old female with a history of cervical cancer in the past she underwent chemo and radiation therapy for this years ago.  She also status post hysterectomy.  There is also a history of depression, anxiety, hypertension, and hyperlipidemia.  She came to the emergency room because of hematuria.  She was found to have a urinary tract infection.  We were asked to see her in regards to jaundice.  She states that her daughter noted that her eyes were yellow a week or so ago.  Today her total bilirubin is 11.9, alkaline phosphatase 138, AST 80, ALT 18.  A hepatitis panel was negative.  Prothrombin time is elevated at 17.4 and INR elevated at 1.5.  An abdominal ultrasound showed gallstones but no dilation of the biliary tree.  It did show advanced fatty liver also.  An MRCP was done showing hepatomegaly, diffuse fatty liver, gallstones, but no evidence of obstructive disease to explain jaundice.  Patient states she does drink some alcohol but usually only on the weekends.  She gives no prior history of liver disease.  She was not taking any medications prior to admission.  Denies excessive Tylenol.    Past Medical History:  Diagnosis Date  . Anxiety   . Depression   . Hyperlipidemia   . Hypertension    Past Surgical History:  Procedure Laterality Date  . ABDOMINAL HYSTERECTOMY     Social History   Socioeconomic History  . Marital status: Single    Spouse name: Not on file  . Number of children: Not on file  . Years of education: Not on file  . Highest education level: Not on file  Occupational History  . Not on file  Tobacco Use  . Smoking status: Never Smoker  . Smokeless tobacco: Never Used  Substance and Sexual Activity  . Alcohol use: Never  . Drug use: Never  . Sexual activity: Not on file  Other Topics Concern  . Not on file  Social History Narrative  . Not on file   Social Determinants of Health   Financial Resource Strain:   .  Difficulty of Paying Living Expenses: Not on file  Food Insecurity:   . Worried About Programme researcher, broadcasting/film/video in the Last Year: Not on file  . Ran Out of Food in the Last Year: Not on file  Transportation Needs:   . Lack of Transportation (Medical): Not on file  . Lack of Transportation (Non-Medical): Not on file  Physical Activity:   . Days of Exercise per Week: Not on file  . Minutes of Exercise per Session: Not on file  Stress:   . Feeling of Stress : Not on file  Social Connections:   . Frequency of Communication with Friends and Family: Not on file  . Frequency of Social Gatherings with Friends and Family: Not on file  . Attends Religious Services: Not on file  . Active Member of Clubs or Organizations: Not on file  . Attends Banker Meetings: Not on file  . Marital Status: Not on file  Intimate Partner Violence:   . Fear of Current or Ex-Partner: Not on file  . Emotionally Abused: Not on file  . Physically Abused: Not on file  . Sexually Abused: Not on file   family history is not on file.  Current Facility-Administered Medications:  .  acetaminophen (TYLENOL) tablet 650 mg, 650 mg, Oral, Q6H PRN **OR** acetaminophen (TYLENOL) suppository 650 mg, 650  mg, Rectal, Q6H PRN, Opyd, Timothy S, MD .  ceFAZolin (ANCEF) IVPB 1 g/50 mL premix, 1 g, Intravenous, Q8H, Hessie Knows, RPH, Last Rate: 100 mL/hr at 10/27/19 1203, 1 g at 10/27/19 1203 .  enoxaparin (LOVENOX) injection 60 mg, 60 mg, Subcutaneous, Q24H, Shade, Jacqulyn Cane, RPH .  famotidine (PEPCID) IVPB 20 mg premix, 20 mg, Intravenous, Q12H, Opyd, Lavone Neri, MD, Last Rate: 100 mL/hr at 10/27/19 0837, 20 mg at 10/27/19 0837 .  HYDROcodone-acetaminophen (NORCO/VICODIN) 5-325 MG per tablet 1-2 tablet, 1-2 tablet, Oral, Q4H PRN, Opyd, Lavone Neri, MD, 2 tablet at 10/27/19 0010 .  ondansetron (ZOFRAN) tablet 4 mg, 4 mg, Oral, Q6H PRN **OR** ondansetron (ZOFRAN) injection 4 mg, 4 mg, Intravenous, Q6H PRN, Opyd, Lavone Neri,  MD .  senna-docusate (Senokot-S) tablet 1 tablet, 1 tablet, Oral, QHS PRN, Opyd, Lavone Neri, MD .  sodium chloride flush (NS) 0.9 % injection 3 mL, 3 mL, Intravenous, Q12H, Opyd, Lavone Neri, MD, 3 mL at 10/26/19 2155 No Known Allergies   Objective:     BP 121/76   Pulse 100   Temp 98 F (36.7 C) (Oral)   Resp 16   Ht 5\' 11"  (1.803 m)   Wt 121 kg   SpO2 97%   BMI 37.21 kg/m   Scleral icterus  No acute distress  Heart regular rhythm no murmurs  Lungs clear  Abdomen: Bowel sounds present, soft, liver felt enlarged in the right upper quadrant, and feels tender in that region with palpation of the liver edges.  Laboratory No components found for: D1    Assessment:     Jaundice  Hepatomegaly  Fatty liver  She could have steatohepatitis  Gallstones, which I do not think are contributing to her current problem and there is no radiological evidence of obstruction      Plan:     Since the hepatitis profile is negative I would also recommend obtaining an ANA and antimitochondrial antibody looking for the possibility of an autoimmune hepatitis or PBC.  Follow clinically.  I suspect that if these are negative she will ultimately need a liver biopsy to further diagnose.

## 2019-10-27 NOTE — Progress Notes (Addendum)
PROGRESS NOTE    Lydia Vasquez  GNO:[id number] DOB: October 08, 1960 DOA: 10/25/2019 PCP: Patient, No Pcp Per   Chief Complaint  Patient presents with  . Hematuria    Brief Narrative:  59 year old female with history of cervical cancer status post hysterectomy, depression, anxiety, hypertension hyperlipidemia presented to the ER with hematuria, fatigue, nausea and back pain.  Several days of gross hematuria with progressive fatigue and nausea.  Patient also reported bilateral mid back pain. In the emergency room she was found afebrile, tachycardic, blood pressures were stable.  Chest x-ray with right lower lobe atelectasis.  Leukocytosis with white cell count of 22.8.  Lactic acid 3.6.  COVID-19 negative. Urinalysis with grossly abnormal urine.  CT scan with no hydronephrosis or collection.  Admitted and treated as severe sepsis due to UTI. Patient moved from [place] to [place] about 1 and half year ago and has not taken any of her blood pressure medicine or cholesterol medicines.  She had similar episode about 2 months ago did not need hospitalization.  Assessment & Plan:   Principal Problem:   Sepsis due to urinary tract infection ([place]) Active Problems:   Hypokalemia   Sepsis ([place])   UTI (urinary tract infection)  Severe sepsis secondary to UTI  E. Coli UTI: Present on admission. Received resuscitation fluid, lactic downtrending Urine cx with e. Coli sensitive to ancef, continue anitbiotics (7 days) Follow blood cultures Sepsis physiology improved  Direct Hyperbilirubinemia  Jaundice  Hepatic Steatosis: Elevated bili and alk phos, mildly elevated AST - stable today RUQ [place] with cholelithiasis without findings c/w cholecystitis Follow monospot - negative Follow MRCP - notable for hepatomegaly and diffuse hepatic steatosis.  No obstructive cause for jaundice identified.  Gallbladder wall thickening (up to 0.6 cm) with multiple small gallstones.  Traces ascites.   Will  consult gastroenterology   Hypokalemia: replace and follow.  Follow mag.  Hypertension: BP stable, continue to monitor  Hyperlipidemia with hepatic steatosis: elevated triglycerides, LDL not calculated.  HDL <10.  Continue to follow.  History of cervical cancer status post total hysterectomy: In 2012.  CT scan with no evidence of recurrent cancer or obstruction.  Abnormal CXR: repeat 10/7 with persistent bibasilar atelectasis/infiltrates.  Small L pleural effusion, possible.  Continue to monitor.  DVT prophylaxis: lovenox Code Status: full code Family Communication: none at bedside - daughter 10/7 Disposition:   Status is: Inpatient  Remains inpatient appropriate because:Inpatient level of care appropriate due to severity of illness   Dispo: The patient is from: Home              Anticipated d/c is to: Home              Anticipated d/c date is: > 3 days              Patient currently is not medically stable to d/c. Consultants:   none  Procedures:   none  Antimicrobials: Anti-infectives (From admission, onward)   Start     Dose/Rate Route Frequency Ordered Stop   10/26/19 1200  ceFAZolin (ANCEF) IVPB 1 g/50 mL premix        1 g 100 mL/hr over 30 Minutes Intravenous Every 8 hours 10/26/19 1044     10/25/19 0330  azithromycin (ZITHROMAX) 500 mg in sodium chloride 0.9 % 250 mL IVPB        500 mg 250 mL/hr over 60 Minutes Intravenous  Once 10/25/19 0321 10/25/19 0617   10/25/19 0145  cefTRIAXone (ROCEPHIN) 1 g  in sodium chloride 0.9 % 100 mL IVPB  Status:  Discontinued        1 g 200 mL/hr over 30 Minutes Intravenous Every 24 hours 10/25/19 0132 10/26/19 1024     Subjective: No new complaints  Objective: Vitals:   10/26/19 2106 10/27/19 0430 10/27/19 0600 10/27/19 1335  BP: 116/75 130/74  121/76  Pulse: (!) 105 99  100  Resp: '20 18  16  ' Temp: 98.5 F (36.9 C) 98 F (36.7 C)  98 F (36.7 C)  TempSrc: Oral Oral  Oral  SpO2: 97% 96%  97%  Weight:   121 kg     Height:        Intake/Output Summary (Last 24 hours) at 10/27/2019 1507 Last data filed at 10/27/2019 0019 Gross per 24 hour  Intake 0 ml  Output --  Net 0 ml   Filed Weights   10/24/19 2011 10/25/19 2026 10/27/19 0600  Weight: 103.2 kg 119.5 kg 121 kg    Examination:  General: No acute distress. Icteric sclera Cardiovascular: Heart sounds show Rodric Punch regular rate, and rhythm Lungs: Clear to auscultation bilaterally  Abdomen: Soft, nontender, nondistended Neurological: Alert and oriented 3. Moves all extremities 4. Cranial nerves II through XII grossly intact. Skin: Warm and dry. No rashes or lesions. Extremities: No clubbing or cyanosis. No edema.   Data Reviewed: I have personally reviewed following labs and imaging studies  CBC: Recent Labs  Lab 10/25/19 0039 10/26/19 0559 10/27/19 0519  WBC 22.8* 15.3* 16.4*  NEUTROABS 18.7* 11.8* 12.5*  HGB 12.3 11.0* 10.1*  HCT 35.3* 32.8* 30.1*  MCV 99.2 103.1* 102.0*  PLT 459* 397 887    Basic Metabolic Panel: Recent Labs  Lab 10/25/19 0039 10/25/19 1211 10/26/19 0559 10/27/19 0519  NA 136  --  139 137  K 3.1*  --  3.6 2.9*  CL 90*  --  98 97*  CO2 27  --  30 28  GLUCOSE 121*  --  93 104*  BUN 10  --  <5* 5*  CREATININE 0.58  --  0.45 0.41*  CALCIUM 9.2  --  8.5* 8.6*  MG  --  1.6* 1.8  --   PHOS  --   --  2.7  --     GFR: Estimated Creatinine Clearance: 111.3 mL/min (Benjamim Harnish) (by C-G formula based on SCr of 0.41 mg/dL (L)).  Liver Function Tests: Recent Labs  Lab 10/25/19 0039 10/26/19 0559 10/27/19 0519  AST 103* 85* 80*  ALT '20 17 18  ' ALKPHOS 163* 140* 138*  BILITOT 15.6* 12.4*  11.8* 11.9*  PROT 8.4* 6.5 6.7  ALBUMIN 2.5* 2.1* 2.1*    CBG: No results for input(s): GLUCAP in the last 168 hours.   Recent Results (from the past 240 hour(s))  Urine Culture     Status: Abnormal   Collection Time: 10/25/19 12:39 AM   Specimen: Urine, Clean Catch  Result Value Ref Range Status   Specimen Description    Final    URINE, CLEAN CATCH Performed at [facility], [place] [address], [place], [place] [address]    Special Requests   Final    NONE Performed at [facility], [place] [address], [place], [place] [address]    Culture >=100,000 COLONIES/mL ESCHERICHIA COLI (Jacob Cicero)  Final   Report Status 10/26/2019 FINAL  Final   Organism ID, Bacteria ESCHERICHIA COLI (Bassem Bernasconi)  Final      Susceptibility   Escherichia coli - MIC*  AMPICILLIN >=32 RESISTANT Resistant     CEFAZOLIN <=4 SENSITIVE Sensitive     CEFTRIAXONE <=0.25 SENSITIVE Sensitive     CIPROFLOXACIN >=4 RESISTANT Resistant     GENTAMICIN >=16 RESISTANT Resistant     IMIPENEM <=0.25 SENSITIVE Sensitive     NITROFURANTOIN <=16 SENSITIVE Sensitive     TRIMETH/SULFA <=20 SENSITIVE Sensitive     AMPICILLIN/SULBACTAM 16 INTERMEDIATE Intermediate     PIP/TAZO <=4 SENSITIVE Sensitive     * >=100,000 COLONIES/mL ESCHERICHIA COLI  Culture, blood (single)     Status: None (Preliminary result)   Collection Time: 10/25/19  1:59 AM   Specimen: BLOOD  Result Value Ref Range Status   Specimen Description   Final    BLOOD RIGHT ANTECUBITAL Performed at [facility] [address], [place], [place] [address]    Special Requests   Final    BOTTLES DRAWN AEROBIC AND ANAEROBIC Blood Culture adequate volume Performed at [facility] [address], [place], [place] [address]    Culture   Final    NO GROWTH 1 DAY Performed at [facility], [place] [address], [place],  [address]    Report Status PENDING  Incomplete  Respiratory Panel by RT PCR (Flu Flavia Bruss&B, Covid) - Nasopharyngeal Swab     Status: None   Collection Time: 10/25/19  3:45 AM   Specimen: Nasopharyngeal Swab  Result Value Ref Range Status   SARS Coronavirus 2 by RT PCR NEGATIVE NEGATIVE Final    Comment: (NOTE) SARS-CoV-2 target nucleic acids are NOT DETECTED.  The SARS-CoV-2 RNA is generally detectable in  upper respiratoy specimens during the acute phase of infection. The lowest concentration of SARS-CoV-2 viral copies this assay can detect is 131 copies/mL. Sharonica Kraszewski negative result does not preclude SARS-Cov-2 infection and should not be used as the sole basis for treatment or other patient management decisions. Kennedy Bohanon negative result may occur with  improper specimen collection/handling, submission of specimen other than nasopharyngeal swab, presence of viral mutation(s) within the areas targeted by this assay, and inadequate number of viral copies (<131 copies/mL). Nialah Saravia negative result must be combined with clinical observations, patient history, and epidemiological information. The expected result is Negative.  Fact Sheet for Patients:  PinkCheek.be  Fact Sheet for Healthcare Providers:  GravelBags.it  This test is no t yet approved or cleared by the [place] FDA and  has been authorized for detection and/or diagnosis of SARS-CoV-2 by FDA under an Emergency Use Authorization (EUA). This EUA will remain  in effect (meaning this test can be used) for the duration of the COVID-19 declaration under Section 564(b)(1) of the Act, 21 U.S.C. section 360bbb-3(b)(1), unless the authorization is terminated or revoked sooner.     Influenza Gwynn Crossley by PCR NEGATIVE NEGATIVE Final   Influenza B by PCR NEGATIVE NEGATIVE Final    Comment: (NOTE) The Xpert Xpress SARS-CoV-2/FLU/RSV assay is intended as an aid in  the diagnosis of influenza from Nasopharyngeal swab specimens and  should not be used as Chrisotpher Rivero sole basis for treatment. Nasal washings and  aspirates are unacceptable for Xpert Xpress SARS-CoV-2/FLU/RSV  testing.  Fact Sheet for Patients: PinkCheek.be  Fact Sheet for Healthcare Providers: GravelBags.it  This test is not yet approved or cleared by the [place] FDA and  has been  authorized for detection and/or diagnosis of SARS-CoV-2 by  FDA under an Emergency Use Authorization (EUA). This EUA will remain  in effect (meaning this test can be used) for the  duration of the  Covid-19 declaration under Section 564(b)(1) of the Act, 21  U.S.C. section 360bbb-3(b)(1), unless the authorization is  terminated or revoked. Performed at [facility], [place] [address] [place], [place] [address]          Radiology Studies: DG CHEST PORT 1 VIEW  Result Date: 10/27/2019 CLINICAL DATA:  History of abnormal chest x-ray. EXAM: PORTABLE CHEST 1 VIEW COMPARISON:  10/25/2019. FINDINGS: Persistent bibasilar atelectasis/infiltrates with slight improvement in aeration on today's exam. Small left pleural effusion cannot be excluded. No pneumothorax. IMPRESSION: Persistent bibasilar atelectasis/infiltrates with slight improvement in aeration on today's exam. Small left pleural effusion cannot be excluded. Electronically Signed   By: Marcello Moores  Register   On: 10/27/2019 06:02   MR ABDOMEN MRCP W WO CONTAST  Result Date: 10/27/2019 CLINICAL DATA:  Jaundice.  Hepatic steatosis and cholelithiasis. EXAM: MRI ABDOMEN WITHOUT AND WITH CONTRAST (INCLUDING MRCP) TECHNIQUE: Multiplanar multisequence MR imaging of the abdomen was performed both before and after the administration of intravenous contrast. Heavily T2-weighted images of the biliary and pancreatic ducts were obtained, and three-dimensional MRCP images were rendered by post processing. CONTRAST:  63m GADAVIST GADOBUTROL 1 MMOL/ML IV SOLN COMPARISON:  CT abdomen 10/25/2019 FINDINGS: Despite efforts by the technologist and patient, motion artifact is present on today's exam and could not be eliminated. This reduces exam sensitivity and specificity. Lower chest: Mild atelectasis along both hemidiaphragms, as on the recent CT. Hepatobiliary: Hepatomegaly with craniocaudad extent of the liver 24.7 cm. Widespread hepatic steatosis,  more striking along the margins of the liver. Gallbladder wall thickening up to 0.6 cm, with multiple small gallstones. There is some questionable narrowing of the common hepatic duct for example on image 31/16. I not see definite abnormal enhancement along the walls of the duct in this vicinity, nor is there is significant amount of upstream biliary dilatation to suggest Harbor Paster significant degree of stenosis/stricture. 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 Adrenals/Urinary Tract:  Unremarkable Stomach/Bowel: Unremarkable Vascular/Lymphatic: Aortoiliac atherosclerotic vascular disease. No pathologic adenopathy identified. Other: No supplemental non-categorized findings. Trace ascites along the anterior border of the lateral segment left hepatic lobe. Musculoskeletal: Unremarkable IMPRESSION: 1. Hepatomegaly and diffuse hepatic steatosis. Slightly narrowed appearance of the common bile duct is probably incidental given the lack of upstream dilatation or abnormal wall thickening. No obstructive cause for jaundice is identified; if clinically warranted, nuclear medicine hepatobiliary scan could be utilized to assess for hepatocellular dysfunction. 2. Gallbladder wall thickening up to 0.6 cm, with multiple small gallstones. Although cholecystitis can produce gallbladder wall thickening, so can hypoalbuminemia (the patient currently has an albumin level of 2.1 grams/deciliter). 3. Trace ascites along the anterior border of the lateral segment left hepatic lobe. 4. 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 5. Mild bibasilar atelectasis. 6.  Aortic Atherosclerosis (ICD10-I70.0). Electronically Signed   By: WVan ClinesM.D.   On: 10/27/2019 08:53   UKoreaAbdomen Limited RUQ  Result Date: 10/25/2019 CLINICAL DATA:  Right upper quadrant abdominal pain EXAM: ULTRASOUND ABDOMEN LIMITED RIGHT  UPPER QUADRANT COMPARISON:  CT scan 10/25/2019 FINDINGS: Gallbladder: Numerous small echogenic shadowing gallstones noted dependently in the gallbladder. No gallbladder wall thickening, pericholecystic fluid or sonographic Murphy sign to suggest acute cholecystitis. The largest calculus measures 1 cm. Common bile duct: Diameter: 6.0 mm Liver: There is diffuse increased echogenicity of the liver and decreased through transmission consistent with fatty infiltration. No  focal lesions or biliary dilatation. Portal vein is patent on color Doppler imaging with normal direction of blood flow towards the liver. Other: None. IMPRESSION: 1. Cholelithiasis without sonographic findings for acute cholecystitis. 2. No intra or extrahepatic biliary dilatation. 3. Advanced fatty infiltration of the liver. Electronically Signed   By: Marijo Sanes M.D.   On: 10/25/2019 16:32        Scheduled Meds: . enoxaparin (LOVENOX) injection  60 mg Subcutaneous Q24H  . sodium chloride flush  3 mL Intravenous Q12H   Continuous Infusions: .  ceFAZolin (ANCEF) IV 1 g (10/27/19 1203)  . famotidine (PEPCID) IV 20 mg (10/27/19 0837)     LOS: 2 days    Time spent: over 30 min    Fayrene Helper, MD [facility]   To contact the attending provider between 7A-7P or the covering provider during after hours 7P-7A, please log into the web site [email or web address] and access using universal [place] password for that web site. If you do not have the password, please call the hospital operator.  10/27/2019, 3:07 PM

## 2019-10-28 DIAGNOSIS — N390 Urinary tract infection, site not specified: Secondary | ICD-10-CM | POA: Diagnosis not present

## 2019-10-28 DIAGNOSIS — A419 Sepsis, unspecified organism: Secondary | ICD-10-CM | POA: Diagnosis not present

## 2019-10-28 LAB — CBC WITH DIFFERENTIAL/PLATELET
Abs Immature Granulocytes: 0.22 10*3/uL — ABNORMAL HIGH (ref 0.00–0.07)
Basophils Absolute: 0.1 10*3/uL (ref 0.0–0.1)
Basophils Relative: 1 %
Eosinophils Absolute: 0.1 10*3/uL (ref 0.0–0.5)
Eosinophils Relative: 1 %
HCT: 30.4 % — ABNORMAL LOW (ref 36.0–46.0)
Hemoglobin: 10.1 g/dL — ABNORMAL LOW (ref 12.0–15.0)
Immature Granulocytes: 1 %
Lymphocytes Relative: 14 %
Lymphs Abs: 2.1 10*3/uL (ref 0.7–4.0)
MCH: 34.8 pg — ABNORMAL HIGH (ref 26.0–34.0)
MCHC: 33.2 g/dL (ref 30.0–36.0)
MCV: 104.8 fL — ABNORMAL HIGH (ref 80.0–100.0)
Monocytes Absolute: 0.7 10*3/uL (ref 0.1–1.0)
Monocytes Relative: 5 %
Neutro Abs: 12.2 10*3/uL — ABNORMAL HIGH (ref 1.7–7.7)
Neutrophils Relative %: 78 %
Platelets: 379 10*3/uL (ref 150–400)
RBC: 2.9 MIL/uL — ABNORMAL LOW (ref 3.87–5.11)
RDW: 19.3 % — ABNORMAL HIGH (ref 11.5–15.5)
WBC: 15.4 10*3/uL — ABNORMAL HIGH (ref 4.0–10.5)
nRBC: 0.1 % (ref 0.0–0.2)

## 2019-10-28 LAB — COMPREHENSIVE METABOLIC PANEL
ALT: 15 U/L (ref 0–44)
AST: 85 U/L — ABNORMAL HIGH (ref 15–41)
Albumin: 2 g/dL — ABNORMAL LOW (ref 3.5–5.0)
Alkaline Phosphatase: 138 U/L — ABNORMAL HIGH (ref 38–126)
Anion gap: 10 (ref 5–15)
BUN: 6 mg/dL (ref 6–20)
CO2: 27 mmol/L (ref 22–32)
Calcium: 8.4 mg/dL — ABNORMAL LOW (ref 8.9–10.3)
Chloride: 98 mmol/L (ref 98–111)
Creatinine, Ser: 0.46 mg/dL (ref 0.44–1.00)
GFR calc non Af Amer: >60 mL/min (ref >60)
Glucose, Bld: 130 mg/dL — ABNORMAL HIGH (ref 70–99)
Potassium: 3.5 mmol/L (ref 3.5–5.1)
Sodium: 135 mmol/L (ref 135–145)
Total Bilirubin: 11.8 mg/dL — ABNORMAL HIGH (ref 0.3–1.2)
Total Protein: 6.5 g/dL (ref 6.5–8.1)

## 2019-10-28 MED ORDER — FAMOTIDINE 20 MG PO TABS
20.0000 mg | ORAL_TABLET | Freq: Two times a day (BID) | ORAL | Status: DC
  Administered 2019-10-28 – 2019-11-08 (×22): 20 mg via ORAL
  Filled 2019-10-28 (×22): qty 1

## 2019-10-28 NOTE — Progress Notes (Signed)
[facility] Progress Note  Lydia Vasquez 59 y.o. 09-04-1960  CC: Jaundice  Subjective: Patient reports feeling slightly better today.  Reports diffuse abdominal discomfort.  Is tolerating a diet.  Denies any nausea or vomiting.  ROS : Review of Systems  Cardiovascular: Negative for chest pain and palpitations.  Gastrointestinal: Positive for abdominal pain. Negative for blood in stool, constipation, diarrhea, heartburn, melena, nausea and vomiting.   Objective: Vital signs in last 24 hours: Vitals:   10/27/19 2218 10/28/19 0612  BP: 126/81 124/71  Pulse: (!) 102 (!) 105  Resp: 18 20  Temp: 98.6 F (37 C) 98.6 F (37 C)  SpO2: 92% 93%    Physical Exam:  General:  Alert, cooperative, no distress, appears stated age  Head:  Normocephalic, without obvious abnormality, atraumatic  Eyes:  Deep icterus, EOMs intact  Lungs:   Clear to auscultation bilaterally, respirations unlabored  Heart:  Mild tachycardia, regular rhythm, S1, S2 normal  Abdomen:   Soft and nondistended with mild diffuse tenderness, bowel sounds active all four quadrants, no guarding or peritoneal signs  Extremities: Extremities normal, atraumatic, no  edema  Pulses: 2+ and symmetric    Lab Results: Recent Labs    10/26/19 0559 10/26/19 0559 10/27/19 0519 10/28/19 0426  NA 139   < > 137 135  K 3.6   < > 2.9* 3.5  CL 98   < > 97* 98  CO2 30   < > 28 27  GLUCOSE 93   < > 104* 130*  BUN <5*   < > 5* 6  CREATININE 0.45   < > 0.41* 0.46  CALCIUM 8.5*   < > 8.6* 8.4*  MG 1.8  --  1.7  --   PHOS 2.7  --   --   --    < > = values in this interval not displayed.   Recent Labs    10/27/19 0519 10/28/19 0426  AST 80* 85*  ALT 18 15  ALKPHOS 138* 138*  BILITOT 11.9* 11.8*  PROT 6.7 6.5  ALBUMIN 2.1* 2.0*   Recent Labs    10/27/19 0519 10/28/19 0426  WBC 16.4* 15.4*  NEUTROABS 12.5* 12.2*  HGB 10.1* 10.1*  HCT 30.1* 30.4*  MCV 102.0* 104.8*  PLT 385 379   Recent Labs     10/27/19 0519  LABPROT 17.4*  INR 1.5*      Assessment: Jaundice: No cause of obstructive jaundice identified on MRCP.  Fatty liver and hepatomegaly, possible steatohepatitis. -LFTs remain elevated: T bili 11.8/AST 85/ALT 15/ALP 138 -WBCs elevated to 15.4, decreased from 22.8 on 10/6 -INR elevated to 1.5 as of 10/27/2019 -Acute hepatitis panel negative 10/25/19 -ANA/AMA pending  Incidental cholelithiasis, gallbladder wall thickening. Cholecystitis vs hypoalbuminemia, albumin 2.0. Continue to monitor clinically.  Plan: Continue supportive care.  Await ANA/AMA.    Suspect patient will require liver biopsy if ANA/AMA are negative.  [facility] will follow.  Edrick Kins PA-C 10/28/2019, 11:21 AM  Contact #  [phone]

## 2019-10-28 NOTE — Progress Notes (Addendum)
PROGRESS NOTE    Lydia Vasquez  GYJ:[id number] DOB: [birth year] DOA: 10/25/2019 PCP: Patient, No Pcp Per   Chief Complaint  Patient presents with  . Hematuria    Brief Narrative:  59 year old female with history of cervical cancer status post hysterectomy, depression, anxiety, hypertension hyperlipidemia presented to the ER with hematuria, fatigue, nausea and back pain.  Several days of gross hematuria with progressive fatigue and nausea.  Patient also reported bilateral mid back pain. In the emergency room she was found afebrile, tachycardic, blood pressures were stable.  Chest x-ray with right lower lobe atelectasis.  Leukocytosis with white cell count of 22.8.  Lactic acid 3.6.  COVID-19 negative. Urinalysis with grossly abnormal urine.  CT scan with no hydronephrosis or collection.  Admitted and treated as severe sepsis due to UTI. Patient moved from [place] to [place] about 1 and half year ago and has not taken any of her blood pressure medicine or cholesterol medicines.  She had similar episode about 2 months ago did not need hospitalization.  Assessment & Plan:   Principal Problem:   Sepsis due to urinary tract infection ([place]) Active Problems:   Hypokalemia   Sepsis ([place])   UTI (urinary tract infection)  Severe sepsis secondary to UTI  E. Coli UTI: Present on admission. Received resuscitation fluid, lactic downtrending Urine cx with e. Coli sensitive to ancef, continue anitbiotics (7 days) Follow blood cultures - NGTD Sepsis physiology improved  Direct Hyperbilirubinemia  Jaundice  Hepatic Steatosis: Elevated bili and alk phos, mildly elevated AST - stable today RUQ [place] with cholelithiasis without findings c/w cholecystitis Follow monospot - negative Follow MRCP - notable for hepatomegaly and diffuse hepatic steatosis.  No obstructive cause for jaundice identified.  Gallbladder wall thickening (up to 0.6 cm) with multiple small gallstones.  Traces ascites.   Will  consult gastroenterology - awaiting AMA and ANA -> will likely need bx if unrevealing   Hypokalemia: replace and follow.  Follow mag.  Hypertension: BP stable, continue to monitor  Hyperlipidemia with hepatic steatosis: elevated triglycerides, LDL not calculated.  HDL <10.  Continue to follow.  History of cervical cancer status post total hysterectomy: In 2012.  CT scan with no evidence of recurrent cancer or obstruction.  Abnormal CXR: repeat 10/7 with persistent bibasilar atelectasis/infiltrates.  Small L pleural effusion, possible.  Continue to monitor.  DVT prophylaxis: lovenox Code Status: full code Family Communication: none at bedside - daughter 10/7 and 10/8 Disposition:   Status is: Inpatient  Remains inpatient appropriate because:Inpatient level of care appropriate due to severity of illness   Dispo: The patient is from: Home              Anticipated d/c is to: Home              Anticipated d/c date is: > 3 days              Patient currently is not medically stable to d/c. Consultants:   none  Procedures:   none  Antimicrobials: Anti-infectives (From admission, onward)   Start     Dose/Rate Route Frequency Ordered Stop   10/26/19 1200  ceFAZolin (ANCEF) IVPB 1 g/50 mL premix        1 g 100 mL/hr over 30 Minutes Intravenous Every 8 hours 10/26/19 1044     10/25/19 0330  azithromycin (ZITHROMAX) 500 mg in sodium chloride 0.9 % 250 mL IVPB        500 mg 250 mL/hr over 60 Minutes  Intravenous  Once 10/25/19 0321 10/25/19 0617   10/25/19 0145  cefTRIAXone (ROCEPHIN) 1 g in sodium chloride 0.9 % 100 mL IVPB  Status:  Discontinued        1 g 200 mL/hr over 30 Minutes Intravenous Every 24 hours 10/25/19 0132 10/26/19 1024     Subjective: No new complaints Bloating   Objective: Vitals:   10/27/19 2218 10/28/19 0500 10/28/19 0612 10/28/19 1233  BP: 126/81  124/71 107/62  Pulse: (!) 102  (!) 105 99  Resp: 18  20   Temp: 98.6 F (37 C)  98.6 F (37 C) 97.8  F (36.6 C)  TempSrc: Oral  Oral Oral  SpO2: 92%  93% 96%  Weight:  121.4 kg    Height:        Intake/Output Summary (Last 24 hours) at 10/28/2019 1306 Last data filed at 10/28/2019 1000 Gross per 24 hour  Intake 510 ml  Output --  Net 510 ml   Filed Weights   10/25/19 2026 10/27/19 0600 10/28/19 0500  Weight: 119.5 kg 121 kg 121.4 kg    Examination:  General: No acute distress Icteric sclera  Cardiovascular: Heart sounds show Lovelle Lema regular rate, and rhythm Lungs: unlabored Abdomen: Soft, nontender, distended Neurological: Alert and oriented 3. Moves all extremities 4 . Cranial nerves II through XII grossly intact. Skin: Warm and dry. No rashes or lesions. Extremities: No clubbing or cyanosis. No edema.    Data Reviewed: I have personally reviewed following labs and imaging studies  CBC: Recent Labs  Lab 10/25/19 0039 10/26/19 0559 10/27/19 0519 10/28/19 0426  WBC 22.8* 15.3* 16.4* 15.4*  NEUTROABS 18.7* 11.8* 12.5* 12.2*  HGB 12.3 11.0* 10.1* 10.1*  HCT 35.3* 32.8* 30.1* 30.4*  MCV 99.2 103.1* 102.0* 104.8*  PLT 459* 397 385 364    Basic Metabolic Panel: Recent Labs  Lab 10/25/19 0039 10/25/19 1211 10/26/19 0559 10/27/19 0519 10/28/19 0426  NA 136  --  139 137 135  K 3.1*  --  3.6 2.9* 3.5  CL 90*  --  98 97* 98  CO2 27  --  '30 28 27  ' GLUCOSE 121*  --  93 104* 130*  BUN 10  --  <5* 5* 6  CREATININE 0.58  --  0.45 0.41* 0.46  CALCIUM 9.2  --  8.5* 8.6* 8.4*  MG  --  1.6* 1.8 1.7  --   PHOS  --   --  2.7  --   --     GFR: Estimated Creatinine Clearance: 111.5 mL/min (by C-G formula based on SCr of 0.46 mg/dL).  Liver Function Tests: Recent Labs  Lab 10/25/19 0039 10/26/19 0559 10/27/19 0519 10/28/19 0426  AST 103* 85* 80* 85*  ALT '20 17 18 15  ' ALKPHOS 163* 140* 138* 138*  BILITOT 15.6* 12.4*  11.8* 11.9* 11.8*  PROT 8.4* 6.5 6.7 6.5  ALBUMIN 2.5* 2.1* 2.1* 2.0*    CBG: No results for input(s): GLUCAP in the last 168  hours.   Recent Results (from the past 240 hour(s))  Urine Culture     Status: Abnormal   Collection Time: 10/25/19 12:39 AM   Specimen: Urine, Clean Catch  Result Value Ref Range Status   Specimen Description   Final    URINE, CLEAN CATCH Performed at [facility], [place] [address], [place], [place] [address]    Special Requests   Final    NONE Performed at [facility], [place] [address] [place],  [place] [address]    Culture >=100,000 COLONIES/mL ESCHERICHIA COLI (Porshea Janowski)  Final   Report Status 10/26/2019 FINAL  Final   Organism ID, Bacteria ESCHERICHIA COLI (Mikell Camp)  Final      Susceptibility   Escherichia coli - MIC*    AMPICILLIN >=32 RESISTANT Resistant     CEFAZOLIN <=4 SENSITIVE Sensitive     CEFTRIAXONE <=0.25 SENSITIVE Sensitive     CIPROFLOXACIN >=4 RESISTANT Resistant     GENTAMICIN >=16 RESISTANT Resistant     IMIPENEM <=0.25 SENSITIVE Sensitive     NITROFURANTOIN <=16 SENSITIVE Sensitive     TRIMETH/SULFA <=20 SENSITIVE Sensitive     AMPICILLIN/SULBACTAM 16 INTERMEDIATE Intermediate     PIP/TAZO <=4 SENSITIVE Sensitive     * >=100,000 COLONIES/mL ESCHERICHIA COLI  Culture, blood (single)     Status: None (Preliminary result)   Collection Time: 10/25/19  1:59 AM   Specimen: BLOOD  Result Value Ref Range Status   Specimen Description   Final    BLOOD RIGHT ANTECUBITAL Performed at [facility] [address] [place], [place] [address]    Special Requests   Final    BOTTLES DRAWN AEROBIC AND ANAEROBIC Blood Culture adequate volume Performed at [facility] [address], [place], [place] [address]    Culture   Final    NO GROWTH 3 DAYS Performed at [facility], [place] [address], [place], [place] [address]    Report Status PENDING  Incomplete  Respiratory Panel by RT PCR (Flu Tyrina Hines&B, Covid) - Nasopharyngeal Swab     Status: None   Collection Time: 10/25/19  3:45 AM   Specimen:  Nasopharyngeal Swab  Result Value Ref Range Status   SARS Coronavirus 2 by RT PCR NEGATIVE NEGATIVE Final    Comment: (NOTE) SARS-CoV-2 target nucleic acids are NOT DETECTED.  The SARS-CoV-2 RNA is generally detectable in upper respiratoy specimens during the acute phase of infection. The lowest concentration of SARS-CoV-2 viral copies this assay can detect is 131 copies/mL. Mariachristina Holle negative result does not preclude SARS-Cov-2 infection and should not be used as the sole basis for treatment or other patient management decisions. Kahner Yanik negative result may occur with  improper specimen collection/handling, submission of specimen other than nasopharyngeal swab, presence of viral mutation(s) within the areas targeted by this assay, and inadequate number of viral copies (<131 copies/mL). Lyzbeth Genrich negative result must be combined with clinical observations, patient history, and epidemiological information. The expected result is Negative.  Fact Sheet for Patients:  PinkCheek.be  Fact Sheet for Healthcare Providers:  GravelBags.it  This test is no t yet approved or cleared by the [place] FDA and  has been authorized for detection and/or diagnosis of SARS-CoV-2 by FDA under an Emergency Use Authorization (EUA). This EUA will remain  in effect (meaning this test can be used) for the duration of the COVID-19 declaration under Section 564(b)(1) of the Act, 21 U.S.C. section 360bbb-3(b)(1), unless the authorization is terminated or revoked sooner.     Influenza Taimane Stimmel by PCR NEGATIVE NEGATIVE Final   Influenza B by PCR NEGATIVE NEGATIVE Final    Comment: (NOTE) The Xpert Xpress SARS-CoV-2/FLU/RSV assay is intended as an aid in  the diagnosis of influenza from Nasopharyngeal swab specimens and  should not be used as Osmel Dykstra sole basis for treatment. Nasal washings and  aspirates are unacceptable for Xpert Xpress SARS-CoV-2/FLU/RSV  testing.  Fact Sheet  for Patients: PinkCheek.be  Fact Sheet for Healthcare Providers: GravelBags.it  This test  is not yet approved or cleared by the [place] and  has been authorized for detection and/or diagnosis of SARS-CoV-2 by  FDA under an Emergency Use Authorization (EUA). This EUA will remain  in effect (meaning this test can be used) for the duration of the  Covid-19 declaration under Section 564(b)(1) of the Act, 21  U.S.C. section 360bbb-3(b)(1), unless the authorization is  terminated or revoked. Performed at [facility], [place] [address], [place],  [address]          Radiology Studies: DG CHEST PORT 1 VIEW  Result Date: 10/27/2019 CLINICAL DATA:  History of abnormal chest x-ray. EXAM: PORTABLE CHEST 1 VIEW COMPARISON:  10/25/2019. FINDINGS: Persistent bibasilar atelectasis/infiltrates with slight improvement in aeration on today's exam. Small left pleural effusion cannot be excluded. No pneumothorax. IMPRESSION: Persistent bibasilar atelectasis/infiltrates with slight improvement in aeration on today's exam. Small left pleural effusion cannot be excluded. Electronically Signed   By: Marcello Moores  Register   On: 10/27/2019 06:02   MR ABDOMEN MRCP W WO CONTAST  Result Date: 10/27/2019 CLINICAL DATA:  Jaundice.  Hepatic steatosis and cholelithiasis. EXAM: MRI ABDOMEN WITHOUT AND WITH CONTRAST (INCLUDING MRCP) TECHNIQUE: Multiplanar multisequence MR imaging of the abdomen was performed both before and after the administration of intravenous contrast. Heavily T2-weighted images of the biliary and pancreatic ducts were obtained, and three-dimensional MRCP images were rendered by post processing. CONTRAST:  59m GADAVIST GADOBUTROL 1 MMOL/ML IV SOLN COMPARISON:  CT abdomen 10/25/2019 FINDINGS: Despite efforts by the technologist and patient, motion artifact is present on today's exam and could not be eliminated. This  reduces exam sensitivity and specificity. Lower chest: Mild atelectasis along both hemidiaphragms, as on the recent CT. Hepatobiliary: Hepatomegaly with craniocaudad extent of the liver 24.7 cm. Widespread hepatic steatosis, more striking along the margins of the liver. Gallbladder wall thickening up to 0.6 cm, with multiple small gallstones. There is some questionable narrowing of the common hepatic duct for example on image 31/16. I not see definite abnormal enhancement along the walls of the duct in this vicinity, nor is there is significant amount of upstream biliary dilatation to suggest Kniyah Khun significant degree of stenosis/stricture. 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 Adrenals/Urinary Tract:  Unremarkable Stomach/Bowel: Unremarkable Vascular/Lymphatic: Aortoiliac atherosclerotic vascular disease. No pathologic adenopathy identified. Other: No supplemental non-categorized findings. Trace ascites along the anterior border of the lateral segment left hepatic lobe. Musculoskeletal: Unremarkable IMPRESSION: 1. Hepatomegaly and diffuse hepatic steatosis. Slightly narrowed appearance of the common bile duct is probably incidental given the lack of upstream dilatation or abnormal wall thickening. No obstructive cause for jaundice is identified; if clinically warranted, nuclear medicine hepatobiliary scan could be utilized to assess for hepatocellular dysfunction. 2. Gallbladder wall thickening up to 0.6 cm, with multiple small gallstones. Although cholecystitis can produce gallbladder wall thickening, so can hypoalbuminemia (the patient currently has an albumin level of 2.1 grams/deciliter). 3. Trace ascites along the anterior border of the lateral segment left hepatic lobe. 4. 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 5. Mild bibasilar atelectasis. 6.  Aortic Atherosclerosis  (ICD10-I70.0). Electronically Signed   By: WVan ClinesM.D.   On: 10/27/2019 08:53        Scheduled Meds: . enoxaparin (LOVENOX) injection  60 mg Subcutaneous Q24H  . famotidine  20 mg Oral BID  . sodium chloride flush  3 mL Intravenous Q12H   Continuous Infusions: .  ceFAZolin (ANCEF) IV 1 g (10/28/19 1219)     LOS: 3 days    Time spent: over 66 min    Fayrene Helper, MD [facility]   To contact the attending provider between 7A-7P or the covering provider during after hours 7P-7A, please log into the web site [email or web address] and access using universal [place] password for that web site. If you do not have the password, please call the hospital operator.  10/28/2019, 1:06 PM

## 2019-10-29 DIAGNOSIS — K7010 Alcoholic hepatitis without ascites: Secondary | ICD-10-CM | POA: Diagnosis not present

## 2019-10-29 DIAGNOSIS — R17 Unspecified jaundice: Secondary | ICD-10-CM | POA: Diagnosis not present

## 2019-10-29 DIAGNOSIS — A419 Sepsis, unspecified organism: Secondary | ICD-10-CM | POA: Diagnosis not present

## 2019-10-29 DIAGNOSIS — N390 Urinary tract infection, site not specified: Secondary | ICD-10-CM | POA: Diagnosis not present

## 2019-10-29 DIAGNOSIS — K760 Fatty (change of) liver, not elsewhere classified: Secondary | ICD-10-CM | POA: Diagnosis not present

## 2019-10-29 LAB — COMPREHENSIVE METABOLIC PANEL
ALT: 15 U/L (ref 0–44)
AST: 74 U/L — ABNORMAL HIGH (ref 15–41)
Albumin: 1.9 g/dL — ABNORMAL LOW (ref 3.5–5.0)
Alkaline Phosphatase: 142 U/L — ABNORMAL HIGH (ref 38–126)
Anion gap: 12 (ref 5–15)
BUN: 7 mg/dL (ref 6–20)
CO2: 26 mmol/L (ref 22–32)
Calcium: 8.7 mg/dL — ABNORMAL LOW (ref 8.9–10.3)
Chloride: 100 mmol/L (ref 98–111)
Creatinine, Ser: 0.33 mg/dL — ABNORMAL LOW (ref 0.44–1.00)
GFR, Estimated: >60 mL/min (ref >60)
Glucose, Bld: 101 mg/dL — ABNORMAL HIGH (ref 70–99)
Potassium: 3.2 mmol/L — ABNORMAL LOW (ref 3.5–5.1)
Sodium: 138 mmol/L (ref 135–145)
Total Bilirubin: 11.4 mg/dL — ABNORMAL HIGH (ref 0.3–1.2)
Total Protein: 6.3 g/dL — ABNORMAL LOW (ref 6.5–8.1)

## 2019-10-29 LAB — CBC WITH DIFFERENTIAL/PLATELET
Abs Immature Granulocytes: 0.2 10*3/uL — ABNORMAL HIGH (ref 0.00–0.07)
Basophils Absolute: 0.1 10*3/uL (ref 0.0–0.1)
Basophils Relative: 0 %
Eosinophils Absolute: 0.1 10*3/uL (ref 0.0–0.5)
Eosinophils Relative: 1 %
HCT: 28.5 % — ABNORMAL LOW (ref 36.0–46.0)
Hemoglobin: 9.8 g/dL — ABNORMAL LOW (ref 12.0–15.0)
Immature Granulocytes: 1 %
Lymphocytes Relative: 13 %
Lymphs Abs: 2.1 10*3/uL (ref 0.7–4.0)
MCH: 35.4 pg — ABNORMAL HIGH (ref 26.0–34.0)
MCHC: 34.4 g/dL (ref 30.0–36.0)
MCV: 102.9 fL — ABNORMAL HIGH (ref 80.0–100.0)
Monocytes Absolute: 0.9 10*3/uL (ref 0.1–1.0)
Monocytes Relative: 6 %
Neutro Abs: 12.3 10*3/uL — ABNORMAL HIGH (ref 1.7–7.7)
Neutrophils Relative %: 79 %
Platelets: 354 10*3/uL (ref 150–400)
RBC: 2.77 MIL/uL — ABNORMAL LOW (ref 3.87–5.11)
RDW: 18.9 % — ABNORMAL HIGH (ref 11.5–15.5)
WBC: 15.7 10*3/uL — ABNORMAL HIGH (ref 4.0–10.5)
nRBC: 0 % (ref 0.0–0.2)

## 2019-10-29 LAB — IRON AND TIBC
Iron: 131 ug/dL (ref 28–170)
Saturation Ratios: 138 % — ABNORMAL HIGH (ref 10.4–31.8)
TIBC: 95 ug/dL — ABNORMAL LOW (ref 250–450)

## 2019-10-29 LAB — PROTIME-INR
INR: 1.5 — ABNORMAL HIGH (ref 0.8–1.2)
Prothrombin Time: 17.9 seconds — ABNORMAL HIGH (ref 11.4–15.2)

## 2019-10-29 LAB — PHOSPHORUS: Phosphorus: 3.3 mg/dL (ref 2.5–4.6)

## 2019-10-29 LAB — MITOCHONDRIAL ANTIBODIES: Mitochondrial M2 Ab, IgG: <20 Units (ref 0.0–20.0)

## 2019-10-29 LAB — ANA W/REFLEX IF POSITIVE: Anti Nuclear Antibody (ANA): NEGATIVE

## 2019-10-29 LAB — MAGNESIUM: Magnesium: 1.9 mg/dL (ref 1.7–2.4)

## 2019-10-29 LAB — FERRITIN: Ferritin: 232 ng/mL (ref 11–307)

## 2019-10-29 MED ORDER — POTASSIUM CHLORIDE CRYS ER 20 MEQ PO TBCR
40.0000 meq | EXTENDED_RELEASE_TABLET | ORAL | Status: AC
  Administered 2019-10-29 (×2): 40 meq via ORAL
  Filled 2019-10-29 (×2): qty 2

## 2019-10-29 NOTE — Progress Notes (Signed)
PROGRESS NOTE    Lydia Vasquez  HUT:[id number] DOB: 01/24/1960 DOA: 10/25/2019 PCP: Patient, No Pcp Per   Chief Complaint  Patient presents with  . Hematuria    Brief Narrative:  59 year old female with history of cervical cancer status post hysterectomy, depression, anxiety, hypertension hyperlipidemia presented to the ER with hematuria, fatigue, nausea and back pain.  Several days of gross hematuria with progressive fatigue and nausea.  Patient also reported bilateral mid back pain. In the emergency room she was found afebrile, tachycardic, blood pressures were stable.  Chest x-ray with right lower lobe atelectasis.  Leukocytosis with white cell count of 22.8.  Lactic acid 3.6.  COVID-19 negative. Urinalysis with grossly abnormal urine.  CT scan with no hydronephrosis or collection.  Admitted and treated as severe sepsis due to UTI. Patient moved from [place] to [place] about 1 and half year ago and has not taken any of her blood pressure medicine or cholesterol medicines.  She had similar episode about 2 months ago did not need hospitalization.  Assessment & Plan:   Principal Problem:   Sepsis due to urinary tract infection ([place]) Active Problems:   Hypokalemia   Sepsis ([place])   UTI (urinary tract infection)  Severe sepsis secondary to UTI  E. Coli UTI: Present on admission. Received resuscitation fluid, lactic downtrending Urine cx with e. Coli sensitive to ancef, continue anitbiotics (7 days) Follow blood cultures - NGTD Sepsis physiology improved  Direct Hyperbilirubinemia  Jaundice  Hepatic Steatosis: Elevated bili and alk phos, mildly elevated AST - relatively stable today RUQ [place] with cholelithiasis without findings c/w cholecystitis Follow monospot - negative Follow MRCP - notable for hepatomegaly and diffuse hepatic steatosis.  No obstructive cause for jaundice identified.  Gallbladder wall thickening (up to 0.6 cm) with multiple small gallstones.  Traces  ascites.   Will consult gastroenterology - awaiting AMA and ANA (negative).  ASMA, a1at, ceruloplasm -> will likely need bx if unrevealing.  Concern for possible alcoholic hepatitis (drinks on weekends).  Consider prednisolone [facility] discriminant function 38.5, it drops to 24.7 when using upper limit of normal for our lab 15 instead of 12), will defer to GI.  Hypokalemia: replace and follow.  Follow mag.  Hypertension: BP stable, continue to monitor  Hyperlipidemia with hepatic steatosis: elevated triglycerides, LDL not calculated.  HDL <10.  Continue to follow.  History of cervical cancer status post total hysterectomy: In 2012.  CT scan with no evidence of recurrent cancer or obstruction.  Abnormal CXR: repeat 10/7 with persistent bibasilar atelectasis/infiltrates.  Small L pleural effusion, possible.  Continue to monitor.  DVT prophylaxis: lovenox Code Status: full code Family Communication: none at bedside - daughter 10/7 and 10/8 Disposition:   Status is: Inpatient  Remains inpatient appropriate because:Inpatient level of care appropriate due to severity of illness   Dispo: The patient is from: Home              Anticipated d/c is to: Home              Anticipated d/c date is: > 3 days              Patient currently is not medically stable to d/c. Consultants:   none  Procedures:   none  Antimicrobials: Anti-infectives (From admission, onward)   Start     Dose/Rate Route Frequency Ordered Stop   10/26/19 1200  ceFAZolin (ANCEF) IVPB 1 g/50 mL premix        1 g 100 mL/hr  over 30 Minutes Intravenous Every 8 hours 10/26/19 1044     10/25/19 0330  azithromycin (ZITHROMAX) 500 mg in sodium chloride 0.9 % 250 mL IVPB        500 mg 250 mL/hr over 60 Minutes Intravenous  Once 10/25/19 0321 10/25/19 0617   10/25/19 0145  cefTRIAXone (ROCEPHIN) 1 g in sodium chloride 0.9 % 100 mL IVPB  Status:  Discontinued        1 g 200 mL/hr over 30 Minutes Intravenous Every 24 hours  10/25/19 0132 10/26/19 1024     Subjective: No new complaints Bloating   Objective: Vitals:   10/28/19 1233 10/28/19 1948 10/29/19 0642 10/29/19 1220  BP: 107/62 113/73 106/71 110/73  Pulse: 99 (!) 101 96 99  Resp:  _0 Temp: 97.8 F (36.6 C) 98.4 F (36.9 C) 98.1 F (36.7 C) 98.3 F (36.8 C)  TempSrc: Oral Oral Oral Oral  SpO2: 96% 93% 93% 96%  Weight:      Height:        Intake/Output Summary (Last 24 hours) at 10/29/2019 1416 Last data filed at 10/29/2019 0600 Gross per 24 hour  Intake 600 ml  Output --  Net 600 ml   Filed Weights   10/25/19 2026 10/27/19 0600 10/28/19 0500  Weight: 119.5 kg 121 kg 121.4 kg    Examination:  General: No acute distress. Icteric sclera Cardiovascular: Heart sounds show Lydia Vasquez regular rate, and rhythm Lungs: Clear to auscultation bilaterally Abdomen: Soft, nontender, nondistended  Neurological: Alert and oriented 3. Moves all extremities 4 . Cranial nerves II through XII grossly intact. Skin: Warm and dry. No rashes or lesions. Extremities: No clubbing or cyanosis. No edema.     Data Reviewed: I have personally reviewed following labs and imaging studies  CBC: Recent Labs  Lab 10/25/19 0039 10/26/19 0559 10/27/19 0519 10/28/19 0426 10/29/19 0525  WBC 22.8* 15.3* 16.4* 15.4* 15.7*  NEUTROABS 18.7* 11.8* 12.5* 12.2* 12.3*  HGB 12.3 11.0* 10.1* 10.1* 9.8*  HCT 35.3* 32.8* 30.1* 30.4* 28.5*  MCV 99.2 103.1* 102.0* 104.8* 102.9*  PLT 459* 397 385 379 242    Basic Metabolic Panel: Recent Labs  Lab 10/25/19 0039 10/25/19 1211 10/26/19 0559 10/27/19 0519 10/28/19 0426 10/29/19 0525  NA 136  --  139 137 135 138  K 3.1*  --  3.6 2.9* 3.5 3.2*  CL 90*  --  98 97* 98 100  CO2 27  --  _1 GLUCOSE 121*  --  93 104* 130* 101*  BUN 10  --  <5* 5* 6 7  CREATININE 0.58  --  0.45 0.41* 0.46 0.33*  CALCIUM 9.2  --  8.5* 8.6* 8.4* 8.7*  MG  --  1.6* 1.8 1.7  --  1.9  PHOS  --   --  2.7  --   --  3.3     GFR: Estimated Creatinine Clearance: 111.5 mL/min (Lydia Vasquez) (by C-G formula based on SCr of 0.33 mg/dL (L)).  Liver Function Tests: Recent Labs  Lab 10/25/19 0039 10/26/19 0559 10/27/19 0519 10/28/19 0426 10/29/19 0525  AST 103* 85* 80* 85* 74*  ALT _2 ALKPHOS 163* 140* 138* 138* 142*  BILITOT 15.6* 12.4*  11.8* 11.9* 11.8* 11.4*  PROT 8.4* 6.5 6.7 6.5 6.3*  ALBUMIN 2.5* 2.1* 2.1* 2.0* 1.9*    CBG: No results for input(s): GLUCAP in the last 168 hours.   Recent Results (from the past 240 hour(s))  Urine Culture     Status: Abnormal   Collection Time: 10/25/19 12:39 AM   Specimen: Urine, Clean Catch  Result Value Ref Range Status   Specimen Description   Final    URINE, CLEAN CATCH Performed at [facility], [place] [address] [place], [place] [address]    Special Requests   Final    NONE Performed at [facility], [place] [address], [place], [place] [address]    Culture >=100,000 COLONIES/mL ESCHERICHIA COLI (Lidie Glade)  Final   Report Status 10/26/2019 FINAL  Final   Organism ID, Bacteria ESCHERICHIA COLI (Apryll Hinkle)  Final      Susceptibility   Escherichia coli - MIC*    AMPICILLIN >=32 RESISTANT Resistant     CEFAZOLIN <=4 SENSITIVE Sensitive     CEFTRIAXONE <=0.25 SENSITIVE Sensitive     CIPROFLOXACIN >=4 RESISTANT Resistant     GENTAMICIN >=16 RESISTANT Resistant     IMIPENEM <=0.25 SENSITIVE Sensitive     NITROFURANTOIN <=16 SENSITIVE Sensitive     TRIMETH/SULFA <=20 SENSITIVE Sensitive     AMPICILLIN/SULBACTAM 16 INTERMEDIATE Intermediate     PIP/TAZO <=4 SENSITIVE Sensitive     * >=100,000 COLONIES/mL ESCHERICHIA COLI  Culture, blood (single)     Status: None (Preliminary result)   Collection Time: 10/25/19  1:59 AM   Specimen: BLOOD  Result Value Ref Range Status   Specimen Description   Final    BLOOD RIGHT ANTECUBITAL Performed at [facility] [address] [place], [place] [address]    Special  Requests   Final    BOTTLES DRAWN AEROBIC AND ANAEROBIC Blood Culture adequate volume Performed at [place] [address] [place], [place] [address]    Culture   Final    NO GROWTH 4 DAYS Performed at [facility], [place] [address] [place], [place] [address]    Report Status PENDING  Incomplete  Respiratory Panel by RT PCR (Flu Itamar Mcgowan&B, Covid) - Nasopharyngeal Swab     Status: None   Collection Time: 10/25/19  3:45 AM   Specimen: Nasopharyngeal Swab  Result Value Ref Range Status   SARS Coronavirus 2 by RT PCR NEGATIVE NEGATIVE Final    Comment: (NOTE) SARS-CoV-2 target nucleic acids are NOT DETECTED.  The SARS-CoV-2 RNA is generally detectable in upper respiratoy specimens during the acute phase of infection. The lowest concentration of SARS-CoV-2 viral copies this assay can detect is 131 copies/mL. Lorain Keast negative result does not preclude SARS-Cov-2 infection and should not be used as the sole basis for treatment or other patient management decisions. Kemonte Ullman negative result may occur with  improper specimen collection/handling, submission of specimen other than nasopharyngeal swab, presence of viral mutation(s) within the areas targeted by this assay, and inadequate number of viral copies (<131 copies/mL). Elmore Hyslop negative result must be combined with clinical observations, patient history, and epidemiological information. The expected result is Negative.  Fact Sheet for Patients:  PinkCheek.be  Fact Sheet for Healthcare Providers:  GravelBags.it  This test is no t yet approved or cleared by the [place] FDA and  has been authorized for detection and/or diagnosis of SARS-CoV-2 by FDA under an Emergency Use Authorization (EUA). This EUA will remain  in effect (meaning this test can be used) for the duration of the COVID-19 declaration under Section 564(b)(1) of the Act, 21 U.S.C. section 360bbb-3(b)(1),  unless the authorization is terminated or revoked sooner.     Influenza Scarlettrose Costilow by PCR NEGATIVE NEGATIVE  Final   Influenza B by PCR NEGATIVE NEGATIVE Final    Comment: (NOTE) The Xpert Xpress SARS-CoV-2/FLU/RSV assay is intended as an aid in  the diagnosis of influenza from Nasopharyngeal swab specimens and  should not be used as Malea Swilling sole basis for treatment. Nasal washings and  aspirates are unacceptable for Xpert Xpress SARS-CoV-2/FLU/RSV  testing.  Fact Sheet for Patients: PinkCheek.be  Fact Sheet for Healthcare Providers: GravelBags.it  This test is not yet approved or cleared by the [place] FDA and  has been authorized for detection and/or diagnosis of SARS-CoV-2 by  FDA under an Emergency Use Authorization (EUA). This EUA will remain  in effect (meaning this test can be used) for the duration of the  Covid-19 declaration under Section 564(b)(1) of the Act, 21  U.S.C. section 360bbb-3(b)(1), unless the authorization is  terminated or revoked. Performed at [facility], [place] [address] [place], [place] [address]          Radiology Studies: No results found.      Scheduled Meds: . enoxaparin (LOVENOX) injection  60 mg Subcutaneous Q24H  . famotidine  20 mg Oral BID  . sodium chloride flush  3 mL Intravenous Q12H   Continuous Infusions: .  ceFAZolin (ANCEF) IV 1 g (10/29/19 1329)     LOS: 4 days    Time spent: over 30 min    Fayrene Helper, MD [facility]   To contact the attending provider between 7A-7P or the covering provider during after hours 7P-7A, please log into the web site [email or web address] and access using universal [place] password for that web site. If you do not have the password, please call the hospital operator.  10/29/2019, 2:16 PM

## 2019-10-29 NOTE — Plan of Care (Signed)
  Problem: Health Behavior/Discharge Planning: Goal: Ability to manage health-related needs will improve Outcome: Progressing   Problem: Clinical Measurements: Goal: Ability to maintain clinical measurements within normal limits will improve Outcome: Progressing Goal: Will remain free from infection Outcome: Progressing Goal: Diagnostic test results will improve Outcome: Progressing   Problem: Activity: Goal: Risk for activity intolerance will decrease Outcome: Progressing   Problem: Nutrition: Goal: Adequate nutrition will be maintained Outcome: Progressing   Problem: Coping: Goal: Level of anxiety will decrease Outcome: Progressing   Problem: Pain Managment: Goal: General experience of comfort will improve Outcome: Progressing   Problem: Safety: Goal: Ability to remain free from injury will improve Outcome: Progressing   Problem: Fluid Volume: Goal: Hemodynamic stability will improve Outcome: Progressing   Problem: Clinical Measurements: Goal: Diagnostic test results will improve Outcome: Progressing Goal: Signs and symptoms of infection will decrease Outcome: Progressing

## 2019-10-29 NOTE — Progress Notes (Signed)
Subjective: Patient is very tearful, anxious and is worried about jaundice and the condition of the liver. She denies abdominal pain, was able to tolerate a regular diet, had a bowel movement today without any blood in stool or black stools.  Objective: Vital signs in last 24 hours: Temp:  [98.1 F (36.7 C)-98.4 F (36.9 C)] 98.3 F (36.8 C) (10/09 1220) Pulse Rate:  [96-101] 99 (10/09 1220) Resp:  [20] 20 (10/09 1220) BP: (106-113)/(71-73) 110/73 (10/09 1220) SpO2:  [93 %-96 %] 96 % (10/09 1220) Weight change:  Last BM Date: 10/27/19  PE: Deeply icteric GENERAL: Obese, alert, oriented to time, place and person ABDOMEN: Soft, nontender EXTREMITIES: No deformity  Lab Results: Results for orders placed or performed during the hospital encounter of 10/25/19 (from the past 48 hour(s))  Comprehensive metabolic panel     Status: Abnormal   Collection Time: 10/28/19  4:26 AM  Result Value Ref Range   Sodium 135 135 - 145 mmol/L   Potassium 3.5 3.5 - 5.1 mmol/L    Comment: DELTA CHECK NOTED   Chloride 98 98 - 111 mmol/L   CO2 27 22 - 32 mmol/L   Glucose, Bld 130 (H) 70 - 99 mg/dL    Comment: Glucose reference range applies only to samples taken after fasting for at least 8 hours.   BUN 6 6 - 20 mg/dL   Creatinine, Ser 3.41 0.44 - 1.00 mg/dL   Calcium 8.4 (L) 8.9 - 10.3 mg/dL   Total Protein 6.5 6.5 - 8.1 g/dL   Albumin 2.0 (L) 3.5 - 5.0 g/dL   AST 85 (H) 15 - 41 U/L   ALT 15 0 - 44 U/L   Alkaline Phosphatase 138 (H) 38 - 126 U/L   Total Bilirubin 11.8 (H) 0.3 - 1.2 mg/dL   GFR calc non Af Amer >60 >60 mL/min   Anion gap 10 5 - 15    Comment: Performed at [facility], [address], [place], [place] [address]  CBC with Differential/Platelet     Status: Abnormal   Collection Time: 10/28/19  4:26 AM  Result Value Ref Range   WBC 15.4 (H) 4.0 - 10.5 K/uL   RBC 2.90 (L) 3.87 - 5.11 MIL/uL   Hemoglobin 10.1 (L) 12.0 - 15.0 g/dL   HCT 24.0 (L) 36 - 46 %    MCV 104.8 (H) 80.0 - 100.0 fL   MCH 34.8 (H) 26.0 - 34.0 pg   MCHC 33.2 30.0 - 36.0 g/dL   RDW 97.3 (H) 53.2 - 99.2 %   Platelets 379 150 - 400 K/uL   nRBC 0.1 0.0 - 0.2 %   Neutrophils Relative % 78 %   Neutro Abs 12.2 (H) 1.7 - 7.7 K/uL   Lymphocytes Relative 14 %   Lymphs Abs 2.1 0.7 - 4.0 K/uL   Monocytes Relative 5 %   Monocytes Absolute 0.7 0.1 - 1.0 K/uL   Eosinophils Relative 1 %   Eosinophils Absolute 0.1 0 - 0 K/uL   Basophils Relative 1 %   Basophils Absolute 0.1 0 - 0 K/uL   Immature Granulocytes 1 %   Abs Immature Granulocytes 0.22 (H) 0.00 - 0.07 K/uL    Comment: Performed at [facility], [address] [place], [place] [address]  ANA w/Reflex if Positive     Status: None   Collection Time: 10/28/19  4:26 AM  Result Value Ref Range   Anti Nuclear Antibody (ANA) Negative Negative  Comment: (NOTE) Performed At: [facility] [address] [place], [place] [id number] Jolene Schimke MD [id number]   CBC with Differential/Platelet     Status: Abnormal   Collection Time: 10/29/19  5:25 AM  Result Value Ref Range   WBC 15.7 (H) 4.0 - 10.5 K/uL   RBC 2.77 (L) 3.87 - 5.11 MIL/uL   Hemoglobin 9.8 (L) 12.0 - 15.0 g/dL   HCT 74.9 (L) 36 - 46 %   MCV 102.9 (H) 80.0 - 100.0 fL   MCH 35.4 (H) 26.0 - 34.0 pg   MCHC 34.4 30.0 - 36.0 g/dL   RDW 44.9 (H) 67.5 - 91.6 %   Platelets 354 150 - 400 K/uL   nRBC 0.0 0.0 - 0.2 %   Neutrophils Relative % 79 %   Neutro Abs 12.3 (H) 1.7 - 7.7 K/uL   Lymphocytes Relative 13 %   Lymphs Abs 2.1 0.7 - 4.0 K/uL   Monocytes Relative 6 %   Monocytes Absolute 0.9 0.1 - 1.0 K/uL   Eosinophils Relative 1 %   Eosinophils Absolute 0.1 0 - 0 K/uL   Basophils Relative 0 %   Basophils Absolute 0.1 0 - 0 K/uL   Immature Granulocytes 1 %   Abs Immature Granulocytes 0.20 (H) 0.00 - 0.07 K/uL    Comment: Performed at [facility], [address], [place], [place] [address]  Comprehensive  metabolic panel     Status: Abnormal   Collection Time: 10/29/19  5:25 AM  Result Value Ref Range   Sodium 138 135 - 145 mmol/L   Potassium 3.2 (L) 3.5 - 5.1 mmol/L   Chloride 100 98 - 111 mmol/L   CO2 26 22 - 32 mmol/L   Glucose, Bld 101 (H) 70 - 99 mg/dL    Comment: Glucose reference range applies only to samples taken after fasting for at least 8 hours.   BUN 7 6 - 20 mg/dL   Creatinine, Ser 5.99 (L) 0.44 - 1.00 mg/dL   Calcium 8.7 (L) 8.9 - 10.3 mg/dL   Total Protein 6.3 (L) 6.5 - 8.1 g/dL   Albumin 1.9 (L) 3.5 - 5.0 g/dL   AST 74 (H) 15 - 41 U/L   ALT 15 0 - 44 U/L   Alkaline Phosphatase 142 (H) 38 - 126 U/L   Total Bilirubin 11.4 (H) 0.3 - 1.2 mg/dL   GFR, Estimated >35 >70 mL/min   Anion gap 12 5 - 15    Comment: Performed at [facility], [address] [place], [place] [address]  Magnesium     Status: None   Collection Time: 10/29/19  5:25 AM  Result Value Ref Range   Magnesium 1.9 1.7 - 2.4 mg/dL    Comment: Performed at [facility], [address], [place], [place] [address]  Phosphorus     Status: None   Collection Time: 10/29/19  5:25 AM  Result Value Ref Range   Phosphorus 3.3 2.5 - 4.6 mg/dL    Comment: Performed at [facility], [address], [place], [place] [address]  Protime-INR     Status: Abnormal   Collection Time: 10/29/19  5:25 AM  Result Value Ref Range   Prothrombin Time 17.9 (H) 11.4 - 15.2 seconds   INR 1.5 (H) 0.8 - 1.2    Comment: (NOTE) INR goal varies based on device and disease states. Performed at [facility], [address] [place], [place] [address]  Studies/Results: No results found.  Medications: I have reviewed the patient's current medications.  Assessment: Hyperbilirubinemia T bili 11.4 Elevated AST/ALT ratio of 74/15, ALP minimally elevated at 142  Mild hypokalemia, normal renal function, leukocytosis, macrocytic anemia hemoglobin 9.8, MCV  102.9, normal platelet Minimally elevated PT/INR of 17.9, 1.5  Widespread hepatic steatosis trace ascites Cholelithiasis without cholecystitis, no intra or extrahepatic biliary dilatation  Plan: I believe hyperbilirubinemia is likely reflective of alcoholic hepatitis Hepatic discriminant function is 39 Patient states she drinks about 5 alcoholic drinks over the weekend, sometimes even more  Elevated WBC count could be related to urinary tract infection or from alcoholic hepatitis  So far hepatitis viral serology is negative for A, B, C. ANA is negative I will send labs for ASMA, AMA, iron panel, alpha 1 antitrypsin and ceruloplasmin. Patient may benefit from liver biopsy, which I have discussed with the patient. No signs of coagulopathy or encephalopathy Cefazolin may cause elevation in AST or ALT but usually is not associated with elevation in total bilirubin.  Total bilirubin seems to be slightly trending down, however if total bilirubin increases,or there is worsening of PT/INR, I will consider starting prednisolone.  Kerin Salen, MD 10/29/2019, 12:34 PM

## 2019-10-30 DIAGNOSIS — R945 Abnormal results of liver function studies: Secondary | ICD-10-CM | POA: Diagnosis not present

## 2019-10-30 DIAGNOSIS — N390 Urinary tract infection, site not specified: Secondary | ICD-10-CM | POA: Diagnosis not present

## 2019-10-30 DIAGNOSIS — K7010 Alcoholic hepatitis without ascites: Secondary | ICD-10-CM | POA: Diagnosis not present

## 2019-10-30 DIAGNOSIS — R17 Unspecified jaundice: Secondary | ICD-10-CM | POA: Diagnosis not present

## 2019-10-30 DIAGNOSIS — A419 Sepsis, unspecified organism: Secondary | ICD-10-CM | POA: Diagnosis not present

## 2019-10-30 DIAGNOSIS — R160 Hepatomegaly, not elsewhere classified: Secondary | ICD-10-CM | POA: Diagnosis not present

## 2019-10-30 DIAGNOSIS — K760 Fatty (change of) liver, not elsewhere classified: Secondary | ICD-10-CM | POA: Diagnosis not present

## 2019-10-30 LAB — CBC WITH DIFFERENTIAL/PLATELET
Abs Immature Granulocytes: 0.15 10*3/uL — ABNORMAL HIGH (ref 0.00–0.07)
Basophils Absolute: 0.1 10*3/uL (ref 0.0–0.1)
Basophils Relative: 0 %
Eosinophils Absolute: 0.1 10*3/uL (ref 0.0–0.5)
Eosinophils Relative: 1 %
HCT: 29.9 % — ABNORMAL LOW (ref 36.0–46.0)
Hemoglobin: 10.1 g/dL — ABNORMAL LOW (ref 12.0–15.0)
Immature Granulocytes: 1 %
Lymphocytes Relative: 15 %
Lymphs Abs: 2.3 10*3/uL (ref 0.7–4.0)
MCH: 35.1 pg — ABNORMAL HIGH (ref 26.0–34.0)
MCHC: 33.8 g/dL (ref 30.0–36.0)
MCV: 103.8 fL — ABNORMAL HIGH (ref 80.0–100.0)
Monocytes Absolute: 0.8 10*3/uL (ref 0.1–1.0)
Monocytes Relative: 5 %
Neutro Abs: 12.4 10*3/uL — ABNORMAL HIGH (ref 1.7–7.7)
Neutrophils Relative %: 78 %
Platelets: 378 10*3/uL (ref 150–400)
RBC: 2.88 MIL/uL — ABNORMAL LOW (ref 3.87–5.11)
RDW: 19.5 % — ABNORMAL HIGH (ref 11.5–15.5)
WBC: 15.8 10*3/uL — ABNORMAL HIGH (ref 4.0–10.5)
nRBC: 0 % (ref 0.0–0.2)

## 2019-10-30 LAB — COMPREHENSIVE METABOLIC PANEL
ALT: 16 U/L (ref 0–44)
AST: 86 U/L — ABNORMAL HIGH (ref 15–41)
Albumin: 2 g/dL — ABNORMAL LOW (ref 3.5–5.0)
Alkaline Phosphatase: 150 U/L — ABNORMAL HIGH (ref 38–126)
Anion gap: 9 (ref 5–15)
BUN: 7 mg/dL (ref 6–20)
CO2: 27 mmol/L (ref 22–32)
Calcium: 8.5 mg/dL — ABNORMAL LOW (ref 8.9–10.3)
Chloride: 99 mmol/L (ref 98–111)
Creatinine, Ser: 0.42 mg/dL — ABNORMAL LOW (ref 0.44–1.00)
GFR, Estimated: >60 mL/min (ref >60)
Glucose, Bld: 118 mg/dL — ABNORMAL HIGH (ref 70–99)
Potassium: 3.7 mmol/L (ref 3.5–5.1)
Sodium: 135 mmol/L (ref 135–145)
Total Bilirubin: 12.2 mg/dL — ABNORMAL HIGH (ref 0.3–1.2)
Total Protein: 6.6 g/dL (ref 6.5–8.1)

## 2019-10-30 LAB — CULTURE, BLOOD (SINGLE)
Culture: NO GROWTH
Special Requests: ADEQUATE

## 2019-10-30 LAB — PROTIME-INR
INR: 1.5 — ABNORMAL HIGH (ref 0.8–1.2)
Prothrombin Time: 18 seconds — ABNORMAL HIGH (ref 11.4–15.2)

## 2019-10-30 LAB — CERULOPLASMIN: Ceruloplasmin: 22.4 mg/dL (ref 19.0–39.0)

## 2019-10-30 LAB — PHOSPHORUS: Phosphorus: 3.3 mg/dL (ref 2.5–4.6)

## 2019-10-30 LAB — ALPHA-1-ANTITRYPSIN: A-1 Antitrypsin, Ser: 213 mg/dL — ABNORMAL HIGH (ref 101–187)

## 2019-10-30 LAB — MAGNESIUM: Magnesium: 1.8 mg/dL (ref 1.7–2.4)

## 2019-10-30 NOTE — Progress Notes (Addendum)
Subjective: Patient complains of bloating and right upper quadrant abdominal pain. She has been able to tolerate her diet without associated nausea or vomiting but complains of bloating. She had a regular bowel movement today morning.  Objective: Vital signs in last 24 hours: Temp:  [98.2 F (36.8 C)-99.5 F (37.5 C)] 98.2 F (36.8 C) (10/10 0632) Pulse Rate:  [99-105] 105 (10/10 0632) Resp:  [18-20] 20 (10/10 0632) BP: (110-129)/(70-75) 120/70 (10/10 0632) SpO2:  [96 %-97 %] 96 % (10/10 0632) Weight change:  Last BM Date: 10/27/19  PE: Overweight, not in distress GENERAL: Deeply icteric ABDOMEN: Right upper quadrant tenderness, normoactive bowel sounds, abdomen otherwise soft EXTREMITIES: No deformity, no edema  Lab Results: Results for orders placed or performed during the hospital encounter of 10/25/19 (from the past 48 hour(s))  CBC with Differential/Platelet     Status: Abnormal   Collection Time: 10/29/19  5:25 AM  Result Value Ref Range   WBC 15.7 (H) 4.0 - 10.5 K/uL   RBC 2.77 (L) 3.87 - 5.11 MIL/uL   Hemoglobin 9.8 (L) 12.0 - 15.0 g/dL   HCT 66.5 (L) 36 - 46 %   MCV 102.9 (H) 80.0 - 100.0 fL   MCH 35.4 (H) 26.0 - 34.0 pg   MCHC 34.4 30.0 - 36.0 g/dL   RDW 99.3 (H) 57.0 - 17.7 %   Platelets 354 150 - 400 K/uL   nRBC 0.0 0.0 - 0.2 %   Neutrophils Relative % 79 %   Neutro Abs 12.3 (H) 1.7 - 7.7 K/uL   Lymphocytes Relative 13 %   Lymphs Abs 2.1 0.7 - 4.0 K/uL   Monocytes Relative 6 %   Monocytes Absolute 0.9 0.1 - 1.0 K/uL   Eosinophils Relative 1 %   Eosinophils Absolute 0.1 0 - 0 K/uL   Basophils Relative 0 %   Basophils Absolute 0.1 0 - 0 K/uL   Immature Granulocytes 1 %   Abs Immature Granulocytes 0.20 (H) 0.00 - 0.07 K/uL    Comment: Performed at [facility], [address], [place], [place] [address]  Comprehensive metabolic panel     Status: Abnormal   Collection Time: 10/29/19  5:25 AM  Result Value Ref Range   Sodium 138 135 -  145 mmol/L   Potassium 3.2 (L) 3.5 - 5.1 mmol/L   Chloride 100 98 - 111 mmol/L   CO2 26 22 - 32 mmol/L   Glucose, Bld 101 (H) 70 - 99 mg/dL    Comment: Glucose reference range applies only to samples taken after fasting for at least 8 hours.   BUN 7 6 - 20 mg/dL   Creatinine, Ser 0.09 (L) 0.44 - 1.00 mg/dL   Calcium 8.7 (L) 8.9 - 10.3 mg/dL   Total Protein 6.3 (L) 6.5 - 8.1 g/dL   Albumin 1.9 (L) 3.5 - 5.0 g/dL   AST 74 (H) 15 - 41 U/L   ALT 15 0 - 44 U/L   Alkaline Phosphatase 142 (H) 38 - 126 U/L   Total Bilirubin 11.4 (H) 0.3 - 1.2 mg/dL   GFR, Estimated >23 >30 mL/min   Anion gap 12 5 - 15    Comment: Performed at [facility], [address], [place], [place] [address]  Magnesium     Status: None   Collection Time: 10/29/19  5:25 AM  Result Value Ref Range   Magnesium 1.9 1.7 - 2.4 mg/dL    Comment: Performed at [facility], [address] [place], [place] [address]  Phosphorus     Status: None   Collection Time: 10/29/19  5:25 AM  Result Value Ref Range   Phosphorus 3.3 2.5 - 4.6 mg/dL    Comment: Performed at [facility], [address], [place], [place] [address]  Protime-INR     Status: Abnormal   Collection Time: 10/29/19  5:25 AM  Result Value Ref Range   Prothrombin Time 17.9 (H) 11.4 - 15.2 seconds   INR 1.5 (H) 0.8 - 1.2    Comment: (NOTE) INR goal varies based on device and disease states. Performed at [facility], [address], [place], [place] [address]   Iron and TIBC     Status: Abnormal   Collection Time: 10/29/19  2:09 PM  Result Value Ref Range   Iron 131 28 - 170 ug/dL   TIBC 95 (L) 101 - 751 ug/dL   Saturation Ratios 025 (H) 10.4 - 31.8 %   UIBC RESULT BELOW REPORTABLE RANGE, ug/dL    Comment: UNABLE TO CALCULATE. Performed at [facility], [address] [place], [place] [address]   Ferritin     Status: None   Collection Time: 10/29/19  2:09  PM  Result Value Ref Range   Ferritin 232 11 - 307 ng/mL    Comment: Performed at [facility], [address], [place], [place] [address]  CBC with Differential/Platelet     Status: Abnormal   Collection Time: 10/30/19  5:39 AM  Result Value Ref Range   WBC 15.8 (H) 4.0 - 10.5 K/uL   RBC 2.88 (L) 3.87 - 5.11 MIL/uL   Hemoglobin 10.1 (L) 12.0 - 15.0 g/dL   HCT 53.6 (L) 36 - 46 %   MCV 103.8 (H) 80.0 - 100.0 fL   MCH 35.1 (H) 26.0 - 34.0 pg   MCHC 33.8 30.0 - 36.0 g/dL   RDW 14.4 (H) 31.5 - 40.0 %   Platelets 378 150 - 400 K/uL   nRBC 0.0 0.0 - 0.2 %   Neutrophils Relative % 78 %   Neutro Abs 12.4 (H) 1.7 - 7.7 K/uL   Lymphocytes Relative 15 %   Lymphs Abs 2.3 0.7 - 4.0 K/uL   Monocytes Relative 5 %   Monocytes Absolute 0.8 0.1 - 1.0 K/uL   Eosinophils Relative 1 %   Eosinophils Absolute 0.1 0 - 0 K/uL   Basophils Relative 0 %   Basophils Absolute 0.1 0 - 0 K/uL   Immature Granulocytes 1 %   Abs Immature Granulocytes 0.15 (H) 0.00 - 0.07 K/uL    Comment: Performed at [facility], [address] [place], [place] [address]  Comprehensive metabolic panel     Status: Abnormal   Collection Time: 10/30/19  5:39 AM  Result Value Ref Range   Sodium 135 135 - 145 mmol/L   Potassium 3.7 3.5 - 5.1 mmol/L   Chloride 99 98 - 111 mmol/L   CO2 27 22 - 32 mmol/L   Glucose, Bld 118 (H) 70 - 99 mg/dL    Comment: Glucose reference range applies only to samples taken after fasting for at least 8 hours.   BUN 7 6 - 20 mg/dL   Creatinine, Ser 9.50 (L) 0.44 - 1.00 mg/dL   Calcium 8.5 (L) 8.9 - 10.3 mg/dL   Total Protein 6.6 6.5 - 8.1 g/dL   Albumin 2.0 (L) 3.5 - 5.0 g/dL   AST 86 (H)  15 - 41 U/L   ALT 16 0 - 44 U/L   Alkaline Phosphatase 150 (H) 38 - 126 U/L   Total Bilirubin 12.2 (H) 0.3 - 1.2 mg/dL   GFR, Estimated >99 >24 mL/min   Anion gap 9 5 - 15    Comment: Performed at [facility], [address], [place], [place] [address]   Magnesium     Status: None   Collection Time: 10/30/19  5:39 AM  Result Value Ref Range   Magnesium 1.8 1.7 - 2.4 mg/dL    Comment: Performed at [facility], [address], [place], [place] [address]  Phosphorus     Status: None   Collection Time: 10/30/19  5:39 AM  Result Value Ref Range   Phosphorus 3.3 2.5 - 4.6 mg/dL    Comment: Performed at [facility], [address], [place], [place] [address]  Protime-INR     Status: Abnormal   Collection Time: 10/30/19  5:39 AM  Result Value Ref Range   Prothrombin Time 18.0 (H) 11.4 - 15.2 seconds   INR 1.5 (H) 0.8 - 1.2    Comment: (NOTE) INR goal varies based on device and disease states. Performed at [facility], [address], [place], [place] [address]     Studies/Results: No results found.  Medications: I have reviewed the patient's current medications.  Assessment: Elevated total bilirubin, 12.2 today, elevated AST/ALT ratio, 86/16 today, minimally elevated ALP of 150 Elevated WBC count of 15.8 Hemoglobin 10.1, elevated MCV 103.8 Steatohepatitis noted on CAT scan  So far work-up has shown iron saturation is 138%, surprisingly ferritin is only 232 ANA negative, AMA less than 20 PT/INR elevated at 18/1.5   Plan: Elevated T bili, elevated AST ALT ratio, severe steatohepatitis, elevated MCV, mildly elevated PT/INR could all be suggestive of alcoholic hepatitis. Without evidence of worsening coagulopathy and no evidence of encephalopathy, there is no evidence of liver failure which is reassuring. I would recommend proceeding with liver biopsy, will order for the test for tomorrow. Other results that are pending are levels of alpha 1 antitrypsin, anti-smooth muscle antibody, ceruloplasmin levels. Very high levels of iron saturation noted, however with a normal ferritin, hemochromatosis unlikely, but will again be evaluated once liver biopsy is performed and pathology  is available. We will keep the patient n.p.o. post midnight for possible liver biopsy tomorrow.  Kerin Salen, MD 10/30/2019, 10:13 AM

## 2019-10-30 NOTE — Progress Notes (Signed)
PROGRESS NOTE    Lydia Vasquez  YOM:[id number] DOB: [birth year] DOA: 10/25/2019 PCP: Lydia Vasquez, No Pcp Per   Chief Complaint  Lydia Vasquez presents with  . Hematuria    Brief Narrative:  59 year old female with history of cervical cancer status post hysterectomy, depression, anxiety, hypertension hyperlipidemia presented to the ER with hematuria, fatigue, nausea and back pain.  Several days of gross hematuria with progressive fatigue and nausea.  Lydia Vasquez also reported bilateral mid back pain. In the emergency room she was found afebrile, tachycardic, blood pressures were stable.  Chest x-ray with right lower lobe atelectasis.  Leukocytosis with white cell count of 22.8.  Lactic acid 3.6.  COVID-19 negative. Urinalysis with grossly abnormal urine.  CT scan with no hydronephrosis or collection.  Admitted and treated as severe sepsis due to UTI. Lydia Vasquez moved from [place] to [place] about 1 and half year ago and has not taken any of her blood pressure medicine or cholesterol medicines.  She had similar episode about 2 months ago did not need hospitalization.  Assessment & Plan:   Principal Problem:   Sepsis due to urinary tract infection ([place]) Active Problems:   Hypokalemia   Sepsis ([place])   UTI (urinary tract infection)  Severe sepsis secondary to UTI  E. Coli UTI: Present on admission. Received resuscitation fluid, lactic downtrending Urine cx with e. Coli sensitive to ancef, continue anitbiotics (7 days) Follow blood cultures - NGTD Sepsis physiology improved  Direct Hyperbilirubinemia  Jaundice  Hepatic Steatosis: Elevated bili and alk phos, mildly elevated AST - relatively stable today RUQ [place] with cholelithiasis without findings c/w cholecystitis Follow monospot - negative Follow MRCP - notable for hepatomegaly and diffuse hepatic steatosis.  No obstructive cause for jaundice identified.  Gallbladder wall thickening (up to 0.6 cm) with multiple small gallstones.  Traces  ascites.   Will consult gastroenterology - awaiting AMA (negative) and ANA (negative).  ASMA, a1at, ceruloplasm -> Plan for liver bx tomorrow.  Concern for possible alcoholic hepatitis. Consider prednisolone [facility] discriminant function 39.8, it drops to 26 when using upper limit of normal for our lab 15 instead of 12), will defer to GI.  Hypokalemia: replace and follow.  Follow mag.  Hypertension: BP stable, continue to monitor  Hyperlipidemia with hepatic steatosis: elevated triglycerides, LDL not calculated.  HDL <10.  Continue to follow.  History of cervical cancer status post total hysterectomy: In 2012.  CT scan with no evidence of recurrent cancer or obstruction.  Abnormal CXR: repeat 10/7 with persistent bibasilar atelectasis/infiltrates.  Small L pleural effusion, possible.  Continue to monitor.  DVT prophylaxis: lovenox Code Status: full code Family Communication: none at bedside - daughter 10/7 and 10/8 Disposition:   Status is: Inpatient  Remains inpatient appropriate because:Inpatient level of care appropriate due to severity of illness   Dispo: The Lydia Vasquez is from: Home              Anticipated d/c is to: Home              Anticipated d/c date is: > 3 days              Lydia Vasquez currently is not medically stable to d/c. Consultants:   none  Procedures:   none  Antimicrobials: Anti-infectives (From admission, onward)   Start     Dose/Rate Route Frequency Ordered Stop   10/26/19 1200  ceFAZolin (ANCEF) IVPB 1 g/50 mL premix        1 g 100 mL/hr over 30 Minutes Intravenous  Every 8 hours 10/26/19 1044     10/25/19 0330  azithromycin (ZITHROMAX) 500 mg in sodium chloride 0.9 % 250 mL IVPB        500 mg 250 mL/hr over 60 Minutes Intravenous  Once 10/25/19 0321 10/25/19 0617   10/25/19 0145  cefTRIAXone (ROCEPHIN) 1 g in sodium chloride 0.9 % 100 mL IVPB  Status:  Discontinued        1 g 200 mL/hr over 30 Minutes Intravenous Every 24 hours 10/25/19 0132 10/26/19  1024     Subjective: No new complaints  Objective: Vitals:   10/29/19 0642 10/29/19 1220 10/29/19 2000 10/30/19 0632  BP: 106/71 110/73 129/75 120/70  Pulse: 96 99 100 (!) 105  Resp: '20 20 18 20  ' Temp: 98.1 F (36.7 C) 98.3 F (36.8 C) 99.5 F (37.5 C) 98.2 F (36.8 C)  TempSrc: Oral Oral Oral Oral  SpO2: 93% 96% 97% 96%  Weight:      Height:        Intake/Output Summary (Last 24 hours) at 10/30/2019 1236 Last data filed at 10/30/2019 0928 Gross per 24 hour  Intake 0 ml  Output --  Net 0 ml   Filed Weights   10/25/19 2026 10/27/19 0600 10/28/19 0500  Weight: 119.5 kg 121 kg 121.4 kg    Examination:  General: No acute distress. Scleral icterus Cardiovascular: Heart sounds show TRUE [place] regular rate, and rhythm. Lungs: Clear to auscultation bilaterally Abdomen: Soft, nontender, nondistended Neurological: Alert and oriented 3. Moves all extremities 4. Cranial nerves II through XII grossly intact. Skin: Warm and dry. No rashes or lesions. Extremities: No clubbing or cyanosis. No edema.  .  Data Reviewed: I have personally reviewed following labs and imaging studies  CBC: Recent Labs  Lab 10/26/19 0559 10/27/19 0519 10/28/19 0426 10/29/19 0525 10/30/19 0539  WBC 15.3* 16.4* 15.4* 15.7* 15.8*  NEUTROABS 11.8* 12.5* 12.2* 12.3* 12.4*  HGB 11.0* 10.1* 10.1* 9.8* 10.1*  HCT 32.8* 30.1* 30.4* 28.5* 29.9*  MCV 103.1* 102.0* 104.8* 102.9* 103.8*  PLT 397 385 379 354 929    Basic Metabolic Panel: Recent Labs  Lab 10/25/19 0039 10/25/19 1211 10/26/19 0559 10/27/19 0519 10/28/19 0426 10/29/19 0525 10/30/19 0539  NA   < >  --  139 137 135 138 135  K   < >  --  3.6 2.9* 3.5 3.2* 3.7  CL   < >  --  98 97* 98 100 99  CO2   < >  --  '30 28 27 26 27  ' GLUCOSE   < >  --  93 104* 130* 101* 118*  BUN   < >  --  <5* 5* '6 7 7  ' CREATININE   < >  --  0.45 0.41* 0.46 0.33* 0.42*  CALCIUM   < >  --  8.5* 8.6* 8.4* 8.7* 8.5*  MG  --  1.6* 1.8 1.7  --  1.9 1.8  PHOS  --    --  2.7  --   --  3.3 3.3   < > = values in this interval not displayed.    GFR: Estimated Creatinine Clearance: 111.5 mL/min (Kamesha Herne) (by C-G formula based on SCr of 0.42 mg/dL (L)).  Liver Function Tests: Recent Labs  Lab 10/26/19 0559 10/27/19 0519 10/28/19 0426 10/29/19 0525 10/30/19 0539  AST 85* 80* 85* 74* 86*  ALT '17 18 15 15 16  ' ALKPHOS 140* 138* 138* 142* 150*  BILITOT 12.4*  11.8* 11.9* 11.8* 11.4*  12.2*  PROT 6.5 6.7 6.5 6.3* 6.6  ALBUMIN 2.1* 2.1* 2.0* 1.9* 2.0*    CBG: No results for input(s): GLUCAP in the last 168 hours.   Recent Results (from the past 240 hour(s))  Urine Culture     Status: Abnormal   Collection Time: 10/25/19 12:39 AM   Specimen: Urine, Clean Catch  Result Value Ref Range Status   Specimen Description   Final    URINE, CLEAN CATCH Performed at [facility], [place] [address], [place], [place] [address]    Special Requests   Final    NONE Performed at [facility], [place] [address] [place], [place] [address]    Culture >=100,000 COLONIES/mL ESCHERICHIA COLI (Eileen Croswell)  Final   Report Status 10/26/2019 FINAL  Final   Organism ID, Bacteria ESCHERICHIA COLI (Alika Eppes)  Final      Susceptibility   Escherichia coli - MIC*    AMPICILLIN >=32 RESISTANT Resistant     CEFAZOLIN <=4 SENSITIVE Sensitive     CEFTRIAXONE <=0.25 SENSITIVE Sensitive     CIPROFLOXACIN >=4 RESISTANT Resistant     GENTAMICIN >=16 RESISTANT Resistant     IMIPENEM <=0.25 SENSITIVE Sensitive     NITROFURANTOIN <=16 SENSITIVE Sensitive     TRIMETH/SULFA <=20 SENSITIVE Sensitive     AMPICILLIN/SULBACTAM 16 INTERMEDIATE Intermediate     PIP/TAZO <=4 SENSITIVE Sensitive     * >=100,000 COLONIES/mL ESCHERICHIA COLI  Culture, blood (single)     Status: None (Preliminary result)   Collection Time: 10/25/19  1:59 AM   Specimen: BLOOD  Result Value Ref Range Status   Specimen Description   Final    BLOOD RIGHT ANTECUBITAL Performed at [place] [address] [place], [place] [address]    Special Requests   Final    BOTTLES DRAWN AEROBIC AND ANAEROBIC Blood Culture adequate volume Performed at [place] [address] [place], [place] [address]    Culture   Final    NO GROWTH 4 DAYS Performed at [facility], [place] [address] [place], [place] [address]    Report Status PENDING  Incomplete  Respiratory Panel by RT PCR (Flu Everhett Bozard&B, Covid) - Nasopharyngeal Swab     Status: None   Collection Time: 10/25/19  3:45 AM   Specimen: Nasopharyngeal Swab  Result Value Ref Range Status   SARS Coronavirus 2 by RT PCR NEGATIVE NEGATIVE Final    Comment: (NOTE) SARS-CoV-2 target nucleic acids are NOT DETECTED.  The SARS-CoV-2 RNA is generally detectable in upper respiratoy specimens during the acute phase of infection. The lowest concentration of SARS-CoV-2 viral copies this assay can detect is 131 copies/mL. Lewis Grivas negative result does not preclude SARS-Cov-2 infection and should not be used as the sole basis for treatment or other Lydia Vasquez management decisions. Michaelyn Wall negative result may occur with  improper specimen collection/handling, submission of specimen other than nasopharyngeal swab, presence of viral mutation(s) within the areas targeted by this assay, and inadequate number of viral copies (<131 copies/mL). Reyann Troop negative result must be combined with clinical observations, Lydia Vasquez history, and epidemiological information. The expected result is Negative.  Fact Sheet for Patients:  PinkCheek.be  Fact Sheet for Healthcare Providers:  GravelBags.it  This test is no t yet approved or cleared by the [place] FDA and  has been authorized for detection and/or diagnosis of SARS-CoV-2 by FDA under an Emergency Use Authorization (EUA). This EUA will remain  in effect (meaning this test  can be used) for the duration of the COVID-19  declaration under Section 564(b)(1) of the Act, 21 U.S.C. section 360bbb-3(b)(1), unless the authorization is terminated or revoked sooner.     Influenza Chase Arnall by PCR NEGATIVE NEGATIVE Final   Influenza B by PCR NEGATIVE NEGATIVE Final    Comment: (NOTE) The Xpert Xpress SARS-CoV-2/FLU/RSV assay is intended as an aid in  the diagnosis of influenza from Nasopharyngeal swab specimens and  should not be used as Adasyn Mcadams sole basis for treatment. Nasal washings and  aspirates are unacceptable for Xpert Xpress SARS-CoV-2/FLU/RSV  testing.  Fact Sheet for Patients: PinkCheek.be  Fact Sheet for Healthcare Providers: GravelBags.it  This test is not yet approved or cleared by the [place] FDA and  has been authorized for detection and/or diagnosis of SARS-CoV-2 by  FDA under an Emergency Use Authorization (EUA). This EUA will remain  in effect (meaning this test can be used) for the duration of the  Covid-19 declaration under Section 564(b)(1) of the Act, 21  U.S.C. section 360bbb-3(b)(1), unless the authorization is  terminated or revoked. Performed at [facility], [place] [address], [place], [place] [address]          Radiology Studies: No results found.      Scheduled Meds: . enoxaparin (LOVENOX) injection  60 mg Subcutaneous Q24H  . famotidine  20 mg Oral BID  . sodium chloride flush  3 mL Intravenous Q12H   Continuous Infusions: .  ceFAZolin (ANCEF) IV 1 g (10/30/19 1138)     LOS: 5 days    Time spent: over 7 min    Fayrene Helper, MD [facility]   To contact the attending provider between 7A-7P or the covering provider during after hours 7P-7A, please log into the web site [email or web address] and access using universal [place] password for that web site. If you do not have the password, please call the hospital operator.  10/30/2019, 12:36 PM

## 2019-10-30 NOTE — Consult Note (Signed)
Chief Complaint: Patient was seen in consultation today for  Chief Complaint  Patient presents with  . Hematuria    Referring Physician(s): Dr. Marca Ancona  Supervising Physician: Oley Balm  Patient Status: [facility] - In-pt  History of Present Illness: Lydia Vasquez is a 59 y.o. female with a medical history significant for alcoholism, anxiety/depression, HTN and cervical cancer (hysterectomy). She presented to the [facility] ED 10/25/19 with complaints of hematuria, nausea, fatigue and back pain. Imaging was positive for hepatomegaly with severe hepatic steatosis and cholelithiasis. Lab work up revealed hyperbilirubinemia and elevated LFTs.   Interventional Radiology has been asked to evaluate this patient for an image-guided random liver biopsy for further work up and evaluation.   Past Medical History:  Diagnosis Date  . Anxiety   . Depression   . Hyperlipidemia   . Hypertension     Past Surgical History:  Procedure Laterality Date  . ABDOMINAL HYSTERECTOMY      Allergies: Patient has no known allergies.  Medications: Prior to Admission medications   Medication Sig Start Date End Date Taking? Authorizing Provider  potassium chloride (KLOR-CON) 10 MEQ tablet Take 4 tablets (40 mEq total) by mouth daily for 5 days. Patient not taking: Reported on 10/25/2019 07/05/19 07/10/19  Dietrich Pates, PA-C     History reviewed. No pertinent family history.  Social History   Socioeconomic History  . Marital status: Single    Spouse name: Not on file  . Number of children: Not on file  . Years of education: Not on file  . Highest education level: Not on file  Occupational History  . Not on file  Tobacco Use  . Smoking status: Never Smoker  . Smokeless tobacco: Never Used  Substance and Sexual Activity  . Alcohol use: Never  . Drug use: Never  . Sexual activity: Not on file  Other Topics Concern  . Not on file  Social History Narrative  . Not on file   Social Determinants of  Health   Financial Resource Strain:   . Difficulty of Paying Living Expenses: Not on file  Food Insecurity:   . Worried About Programme researcher, broadcasting/film/video in the Last Year: Not on file  . Ran Out of Food in the Last Year: Not on file  Transportation Needs:   . Lack of Transportation (Medical): Not on file  . Lack of Transportation (Non-Medical): Not on file  Physical Activity:   . Days of Exercise per Week: Not on file  . Minutes of Exercise per Session: Not on file  Stress:   . Feeling of Stress : Not on file  Social Connections:   . Frequency of Communication with Friends and Family: Not on file  . Frequency of Social Gatherings with Friends and Family: Not on file  . Attends Religious Services: Not on file  . Active Member of Clubs or Organizations: Not on file  . Attends Banker Meetings: Not on file  . Marital Status: Not on file    Review of Systems: A 12 point ROS discussed and pertinent positives are indicated in the HPI above.  All other systems are negative.  Review of Systems  Constitutional: Positive for appetite change and fatigue.  Respiratory: Negative for cough and shortness of breath.   Cardiovascular: Positive for leg swelling.  Gastrointestinal: Positive for abdominal distention, abdominal pain and nausea. Negative for diarrhea and vomiting.  Musculoskeletal: Negative for back pain.  Neurological: Negative for headaches.    Vital Signs: BP 120/70 (  BP Location: Left Arm)   Pulse (!) 105   Temp 98.2 F (36.8 C) (Oral)   Resp 20   Ht 5\' 11"  (1.803 m)   Wt 267 lb 10.2 oz (121.4 kg)   SpO2 96%   BMI 37.33 kg/m   Physical Exam Constitutional:      General: She is not in acute distress. HENT:     Mouth/Throat:     Mouth: Mucous membranes are moist.     Pharynx: Oropharynx is clear.  Eyes:     General: Scleral icterus present.  Cardiovascular:     Rate and Rhythm: Normal rate and regular rhythm.     Pulses: Normal pulses.     Heart sounds:  Normal heart sounds.  Pulmonary:     Effort: Pulmonary effort is normal.     Breath sounds: Normal breath sounds.  Abdominal:     General: There is distension.     Tenderness: There is abdominal tenderness.     Comments: Generalized tenderness  Musculoskeletal:     Right lower leg: Edema present.     Left lower leg: Edema present.  Skin:    General: Skin is warm and dry.  Neurological:     Mental Status: She is alert and oriented to person, place, and time.    Imaging: CT ABDOMEN PELVIS W CONTRAST  Result Date: 10/25/2019 CLINICAL DATA:  Abdominal abscess/infection suspected EXAM: CT ABDOMEN AND PELVIS WITH CONTRAST TECHNIQUE: Multidetector CT imaging of the abdomen and pelvis was performed using the standard protocol following bolus administration of intravenous contrast. CONTRAST:  12/25/2019 OMNIPAQUE IOHEXOL 300 MG/ML  SOLN COMPARISON:  07/05/2019 noncontrast abdominal CT FINDINGS: Lower chest: Atelectasis or scarring at the lung bases, also seen on prior. Hepatobiliary: Hepatic steatosis which is marked.Layering gallstone. No detected acute inflammation or gallbladder over distension. No bile duct dilatation. Pancreas: Unremarkable. Spleen: Nonspecific subcapsular low-density in the upper spleen, likely subtly present on prior. In isolation, usually these lesions are incidental and noncontributory. Adrenals/Urinary Tract: Negative adrenals. No hydronephrosis or stone. Unremarkable bladder. Stomach/Bowel:  No obstruction. No evidence of bowel inflammation. Vascular/Lymphatic: No acute vascular abnormality. Multifocal atherosclerosis, notably extensive for age. No mass or adenopathy. Reproductive:Hysterectomy. Other: No ascites or pneumoperitoneum. Musculoskeletal: No acute abnormalities. IMPRESSION: 1. No acute finding. 2. Severe hepatic steatosis. 3. Cholelithiasis. 4. Atherosclerosis. Electronically Signed   By: 07/07/2019 M.D.   On: 10/25/2019 07:09   DG CHEST PORT 1 VIEW  Result Date:  10/27/2019 CLINICAL DATA:  History of abnormal chest x-ray. EXAM: PORTABLE CHEST 1 VIEW COMPARISON:  10/25/2019. FINDINGS: Persistent bibasilar atelectasis/infiltrates with slight improvement in aeration on today's exam. Small left pleural effusion cannot be excluded. No pneumothorax. IMPRESSION: Persistent bibasilar atelectasis/infiltrates with slight improvement in aeration on today's exam. Small left pleural effusion cannot be excluded. Electronically Signed   By: 12/25/2019  Register   On: 10/27/2019 06:02   DG Chest Port 1 View  Result Date: 10/25/2019 CLINICAL DATA:  Sepsis EXAM: PORTABLE CHEST 1 VIEW COMPARISON:  None. FINDINGS: The heart size and mediastinal contours are within normal limits. Shallow degree of aeration with subsegmental atelectasis is seen. There is also patchy airspace opacity seen within the right infrahilar region. Again noted is elevation of the right hemidiaphragm. No acute osseous abnormality. IMPRESSION: Shallow degree of aeration with patchy airspace opacity at the right lung base which could be due to atelectasis and/or early infectious etiology. Electronically Signed   By: 12/25/2019 M.D.   On: 10/25/2019 02:34  MR ABDOMEN MRCP W WO CONTAST  Result Date: 10/27/2019 CLINICAL DATA:  Jaundice.  Hepatic steatosis and cholelithiasis. EXAM: MRI ABDOMEN WITHOUT AND WITH CONTRAST (INCLUDING MRCP) TECHNIQUE: Multiplanar multisequence MR imaging of the abdomen was performed both before and after the administration of intravenous contrast. Heavily T2-weighted images of the biliary and pancreatic ducts were obtained, and three-dimensional MRCP images were rendered by post processing. CONTRAST:  10mL GADAVIST GADOBUTROL 1 MMOL/ML IV SOLN COMPARISON:  CT abdomen 10/25/2019 FINDINGS: Despite efforts by the technologist and patient, motion artifact is present on today's exam and could not be eliminated. This reduces exam sensitivity and specificity. Lower chest: Mild atelectasis along both  hemidiaphragms, as on the recent CT. Hepatobiliary: Hepatomegaly with craniocaudad extent of the liver 24.7 cm. Widespread hepatic steatosis, more striking along the margins of the liver. Gallbladder wall thickening up to 0.6 cm, with multiple small gallstones. There is some questionable narrowing of the common hepatic duct for example on image 31/16. I not see definite abnormal enhancement along the walls of the duct in this vicinity, nor is there is significant amount of upstream biliary dilatation to suggest a significant degree of stenosis/stricture. Pancreas: Unremarkable Spleen: T2 hyperintense 1.6 by 1.3 cm lesion of the upper spleen demonstrates progressive marginal enhancement and is probably a hemangioma. Adrenals/Urinary Tract:  Unremarkable Stomach/Bowel: Unremarkable Vascular/Lymphatic: Aortoiliac atherosclerotic vascular disease. No pathologic adenopathy identified. Other: No supplemental non-categorized findings. Trace ascites along the anterior border of the lateral segment left hepatic lobe. Musculoskeletal: Unremarkable IMPRESSION: 1. Hepatomegaly and diffuse hepatic steatosis. Slightly narrowed appearance of the common bile duct is probably incidental given the lack of upstream dilatation or abnormal wall thickening. No obstructive cause for jaundice is identified; if clinically warranted, nuclear medicine hepatobiliary scan could be utilized to assess for hepatocellular dysfunction. 2. Gallbladder wall thickening up to 0.6 cm, with multiple small gallstones. Although cholecystitis can produce gallbladder wall thickening, so can hypoalbuminemia (the patient currently has an albumin level of 2.1 grams/deciliter). 3. Trace ascites along the anterior border of the lateral segment left hepatic lobe. 4. 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 5. Mild bibasilar atelectasis. 6.  Aortic Atherosclerosis (ICD10-I70.0). Electronically Signed   By: Gaylyn RongWalter  Liebkemann M.D.   On: 10/27/2019 08:53   US  Abdomen Limited RUQ  Result Date: 10/25/2019 CLINICAL DATA:  Right upper quadrant abdominal pain EXAM: ULTRASOUND ABDOMEN LIMITED RIGHT UPPER QUADRANT COMPARISON:  CT scan 10/25/2019 FINDINGS: Gallbladder: Numerous small echogenic shadowing gallstones noted dependently in the gallbladder. No gallbladder wall thickening, pericholecystic fluid or sonographic Murphy sign to suggest acute cholecystitis. The largest calculus measures 1 cm. Common bile duct: Diameter: 6.0 mm Liver: There is diffuse increased echogenicity of the liver and decreased through transmission consistent with fatty infiltration. No focal lesions or biliary dilatation. Portal vein is patent on color Doppler imaging with normal direction of blood flow towards the liver. Other: None. IMPRESSION: 1. Cholelithiasis without sonographic findings for acute cholecystitis. 2. No intra or extrahepatic biliary dilatation. 3. Advanced fatty infiltration of the liver. Electronically Signed   By: Rudie MeyerP.  Gallerani M.D.   On: 10/25/2019 16:32    Labs:  CBC: Recent Labs    10/27/19 0519 10/28/19 0426 10/29/19 0525 10/30/19 0539  WBC 16.4* 15.4* 15.7* 15.8*  HGB 10.1* 10.1* 9.8* 10.1*  HCT 30.1* 30.4* 28.5* 29.9*  PLT 385 379 354 378    COAGS: Recent Labs    10/27/19 0519 10/29/19 0525 10/30/19 0539  INR 1.5* 1.5* 1.5*    BMP: Recent  Labs    07/05/19 0115 07/05/19 0115 10/25/19 0039 10/26/19 0559 10/27/19 0519 10/28/19 0426 10/29/19 0525 10/30/19 0539  NA 135   < > 136   < > 137 135 138 135  K 2.9*   < > 3.1*   < > 2.9* 3.5 3.2* 3.7  CL 94*   < > 90*   < > 97* 98 100 99  CO2 26   < > 27   < > 28 27 26 27   GLUCOSE 122*   < > 121*   < > 104* 130* 101* 118*  BUN 7   < > 10   < > 5* 6 7 7   CALCIUM 8.8*   < > 9.2   < > 8.6* 8.4* 8.7* 8.5*  CREATININE 0.48   < > 0.58   < > 0.41* 0.46 0.33* 0.42*  GFRNONAA >60   < > >60   < > >60 >60 >60 >60  GFRAA >60  --  >60  --   --   --   --   --    < > = values in this interval not  displayed.    LIVER FUNCTION TESTS: Recent Labs    10/27/19 0519 10/28/19 0426 10/29/19 0525 10/30/19 0539  BILITOT 11.9* 11.8* 11.4* 12.2*  AST 80* 85* 74* 86*  ALT 18 15 15 16   ALKPHOS 138* 138* 142* 150*  PROT 6.7 6.5 6.3* 6.6  ALBUMIN 2.1* 2.0* 1.9* 2.0*    TUMOR MARKERS: No results for input(s): AFPTM, CEA, CA199, CHROMGRNA in the last 8760 hours.  Assessment and Plan:  Hepatomegaly; hepatic steatosis; elevated liver enzymes and bilirubin: 12/29/19, 59 year old female, is tentatively scheduled for an image-guided random liver biopsy 10/31/19 at the [facility] Interventional Radiology department.   Risks and benefits of this procedure were discussed with the patient including, but not limited to bleeding, infection, damage to adjacent structures or low yield requiring additional tests.  All of the questions were answered and there is agreement to proceed.  She will be NPO after midnight. AM labs have been ordered. Her dose of lovenox will be held tonight.   Consent signed and in chart.  Thank you for this interesting consult.  I greatly enjoyed meeting Lydia Vasquez and look forward to participating in their care.  A copy of this report was sent to the requesting provider on this date.  Electronically Signed: 12/31/19, AGACNP-BC [phone] 10/30/2019, 12:49 PM   I spent a total of 20 Minutes    in face to face in clinical consultation, greater than 50% of which was counseling/coordinating care for image-guided random liver biopsy.

## 2019-10-30 NOTE — Plan of Care (Signed)
  Problem: Clinical Measurements: Goal: Ability to maintain clinical measurements within normal limits will improve Outcome: Progressing Goal: Will remain free from infection Outcome: Progressing Goal: Diagnostic test results will improve Outcome: Progressing   Problem: Activity: Goal: Risk for activity intolerance will decrease Outcome: Progressing   Problem: Coping: Goal: Level of anxiety will decrease Outcome: Progressing   Problem: Pain Managment: Goal: General experience of comfort will improve Outcome: Progressing   

## 2019-10-31 ENCOUNTER — Inpatient Hospital Stay (HOSPITAL_COMMUNITY): Payer: Medicaid Other

## 2019-10-31 DIAGNOSIS — R17 Unspecified jaundice: Secondary | ICD-10-CM | POA: Diagnosis not present

## 2019-10-31 DIAGNOSIS — K7581 Nonalcoholic steatohepatitis (NASH): Secondary | ICD-10-CM | POA: Diagnosis not present

## 2019-10-31 DIAGNOSIS — N390 Urinary tract infection, site not specified: Secondary | ICD-10-CM | POA: Diagnosis not present

## 2019-10-31 DIAGNOSIS — K760 Fatty (change of) liver, not elsewhere classified: Secondary | ICD-10-CM | POA: Diagnosis not present

## 2019-10-31 DIAGNOSIS — A419 Sepsis, unspecified organism: Secondary | ICD-10-CM | POA: Diagnosis not present

## 2019-10-31 DIAGNOSIS — K7460 Unspecified cirrhosis of liver: Secondary | ICD-10-CM | POA: Diagnosis not present

## 2019-10-31 DIAGNOSIS — R7989 Other specified abnormal findings of blood chemistry: Secondary | ICD-10-CM | POA: Diagnosis not present

## 2019-10-31 LAB — COMPREHENSIVE METABOLIC PANEL
ALT: 17 U/L (ref 0–44)
AST: 82 U/L — ABNORMAL HIGH (ref 15–41)
Albumin: 2 g/dL — ABNORMAL LOW (ref 3.5–5.0)
Alkaline Phosphatase: 146 U/L — ABNORMAL HIGH (ref 38–126)
Anion gap: 11 (ref 5–15)
BUN: 7 mg/dL (ref 6–20)
CO2: 23 mmol/L (ref 22–32)
Calcium: 8.5 mg/dL — ABNORMAL LOW (ref 8.9–10.3)
Chloride: 101 mmol/L (ref 98–111)
Creatinine, Ser: 0.36 mg/dL — ABNORMAL LOW (ref 0.44–1.00)
GFR, Estimated: >60 mL/min (ref >60)
Glucose, Bld: 97 mg/dL (ref 70–99)
Potassium: 3.7 mmol/L (ref 3.5–5.1)
Sodium: 135 mmol/L (ref 135–145)
Total Bilirubin: 11.5 mg/dL — ABNORMAL HIGH (ref 0.3–1.2)
Total Protein: 6.5 g/dL (ref 6.5–8.1)

## 2019-10-31 LAB — PROTIME-INR
INR: 1.5 — ABNORMAL HIGH (ref 0.8–1.2)
Prothrombin Time: 17.2 seconds — ABNORMAL HIGH (ref 11.4–15.2)

## 2019-10-31 LAB — CBC WITH DIFFERENTIAL/PLATELET
Abs Immature Granulocytes: 0.11 10*3/uL — ABNORMAL HIGH (ref 0.00–0.07)
Basophils Absolute: 0.1 10*3/uL (ref 0.0–0.1)
Basophils Relative: 1 %
Eosinophils Absolute: 0.1 10*3/uL (ref 0.0–0.5)
Eosinophils Relative: 1 %
HCT: 29.7 % — ABNORMAL LOW (ref 36.0–46.0)
Hemoglobin: 10 g/dL — ABNORMAL LOW (ref 12.0–15.0)
Immature Granulocytes: 1 %
Lymphocytes Relative: 14 %
Lymphs Abs: 2.1 10*3/uL (ref 0.7–4.0)
MCH: 34.4 pg — ABNORMAL HIGH (ref 26.0–34.0)
MCHC: 33.7 g/dL (ref 30.0–36.0)
MCV: 102.1 fL — ABNORMAL HIGH (ref 80.0–100.0)
Monocytes Absolute: 0.8 10*3/uL (ref 0.1–1.0)
Monocytes Relative: 5 %
Neutro Abs: 12.4 10*3/uL — ABNORMAL HIGH (ref 1.7–7.7)
Neutrophils Relative %: 78 %
Platelets: 354 10*3/uL (ref 150–400)
RBC: 2.91 MIL/uL — ABNORMAL LOW (ref 3.87–5.11)
RDW: 19.1 % — ABNORMAL HIGH (ref 11.5–15.5)
WBC: 15.7 10*3/uL — ABNORMAL HIGH (ref 4.0–10.5)
nRBC: 0.1 % (ref 0.0–0.2)

## 2019-10-31 LAB — PHOSPHORUS: Phosphorus: 3.6 mg/dL (ref 2.5–4.6)

## 2019-10-31 LAB — MAGNESIUM: Magnesium: 1.7 mg/dL (ref 1.7–2.4)

## 2019-10-31 MED ORDER — LIDOCAINE-EPINEPHRINE (PF) 2 %-1:200000 IJ SOLN
INTRAMUSCULAR | Status: AC
  Filled 2019-10-31: qty 20

## 2019-10-31 MED ORDER — LIDOCAINE-EPINEPHRINE (PF) 1 %-1:200000 IJ SOLN
INTRAMUSCULAR | Status: AC | PRN
  Administered 2019-10-31: 10 mL

## 2019-10-31 MED ORDER — LIDOCAINE HCL 1 % IJ SOLN
INTRAMUSCULAR | Status: AC
  Filled 2019-10-31: qty 20

## 2019-10-31 MED ORDER — MIDAZOLAM HCL 2 MG/2ML IJ SOLN
INTRAMUSCULAR | Status: AC | PRN
  Administered 2019-10-31 (×2): 1 mg via INTRAVENOUS

## 2019-10-31 MED ORDER — MIDAZOLAM HCL 2 MG/2ML IJ SOLN
INTRAMUSCULAR | Status: AC
  Filled 2019-10-31: qty 4

## 2019-10-31 MED ORDER — GELATIN ABSORBABLE 12-7 MM EX MISC
CUTANEOUS | Status: AC
  Filled 2019-10-31: qty 1

## 2019-10-31 MED ORDER — ENOXAPARIN SODIUM 60 MG/0.6ML ~~LOC~~ SOLN
60.0000 mg | SUBCUTANEOUS | Status: DC
  Administered 2019-11-01 – 2019-11-08 (×8): 60 mg via SUBCUTANEOUS
  Filled 2019-10-31 (×9): qty 0.6

## 2019-10-31 MED ORDER — FENTANYL CITRATE (PF) 100 MCG/2ML IJ SOLN
INTRAMUSCULAR | Status: AC
  Filled 2019-10-31: qty 2

## 2019-10-31 NOTE — Procedures (Signed)
Pre procedural Dx: Elevated LFTs  Post procedural Dx: Same  Technically successful US guided Bx of the right lobe of the liver.   EBL: Trace Complications: None immediate.   Katherina Right, MD Pager #: [phone]

## 2019-10-31 NOTE — Progress Notes (Signed)
[facility] Gastroenterology Progress Note  Lydia Vasquez 59 y.o. 01/16/1961  CC: Jaundice  Subjective: Patient had liver biopsy this morning.  Reports some abdominal "soreness" today. She is tolerating a diet.  Denies any nausea or vomiting. States she has been having a bowel movement daily.  Reports recent heavy alcohol use, approximately 1/2 bottle vodka per day.  ROS : Review of Systems  Cardiovascular: Negative for chest pain and palpitations.  Gastrointestinal: Positive for abdominal pain. Negative for blood in stool, constipation, diarrhea, heartburn, melena, nausea and vomiting.   Objective: Vital signs in last 24 hours: Vitals:   10/31/19 0940 10/31/19 0945  BP: 115/84 106/73  Pulse: 99 99  Resp: 19 (!) 22  Temp:    SpO2: 100% 100%    Physical Exam:  General:  Alert, cooperative, no distress, appears stated age  Head:  Normocephalic, without obvious abnormality, atraumatic  Eyes:  Deep icterus, EOMs intact  Lungs:   Clear to auscultation bilaterally, respirations unlabored  Heart:  Regular rate and rhythm, S1, S2 normal  Abdomen:   Soft and nondistended with mild diffuse tenderness, bowel sounds active all four quadrants, no guarding or peritoneal signs  Extremities: Extremities normal, atraumatic, no  edema  Pulses: 2+ and symmetric    Lab Results: Recent Labs    10/30/19 0539 10/31/19 0531  NA 135 135  K 3.7 3.7  CL 99 101  CO2 27 23  GLUCOSE 118* 97  BUN 7 7  CREATININE 0.42* 0.36*  CALCIUM 8.5* 8.5*  MG 1.8 1.7  PHOS 3.3 3.6   Recent Labs    10/30/19 0539 10/31/19 0531  AST 86* 82*  ALT 16 17  ALKPHOS 150* 146*  BILITOT 12.2* 11.5*  PROT 6.6 6.5  ALBUMIN 2.0* 2.0*   Recent Labs    10/30/19 0539 10/31/19 0531  WBC 15.8* 15.7*  NEUTROABS 12.4* 12.4*  HGB 10.1* 10.0*  HCT 29.9* 29.7*  MCV 103.8* 102.1*  PLT 378 354   Recent Labs    10/30/19 0539 10/31/19 0531  LABPROT 18.0* 17.2*  INR 1.5* 1.5*      Assessment: Jaundice: No cause  of obstructive jaundice identified on MRCP.  Fatty liver and hepatomegaly, possible steatohepatitis vs. Alcoholic hepatitis.  Hepatic discriminant function <32 (with PT reference 15.2) -LFTs remain elevated: T bili 11.5/AST 82/ALT 17/ALP 146 -WBCs elevated to 15.7, stable -INR elevated to 1.5 with PT 17.2 as of 10/31/2019 -Acute hepatitis panel negative 10/25/19 -ANA negative, AMA negative 10/8 -ASMA pending -Ceruloplasmin normal, no alpha 1 antitrypsin deficiency -Liver biopsy 10/11, path pending  Incidental cholelithiasis, gallbladder wall thickening. Cholecystitis vs hypoalbuminemia, albumin 2.0.   Plan: Continue supportive care. Await liver biopsy results.  [facility] will follow.  Edrick Kins PA-C 10/31/2019, 10:51 AM  Contact #  [phone]

## 2019-10-31 NOTE — Progress Notes (Signed)
MEDICATION-RELATED CONSULT NOTE   IR Procedure Consult - Anticoagulant/Antiplatelet PTA/Inpatient Med List Review by Pharmacist    Procedure: US guided liver biopsy    Completed: 10/31/19 at 09:54  Post-Procedural bleeding risk per IR MD assessment:  HIGH  Antithrombotic medications on inpatient or PTA profile prior to procedure:       Recommended restart time per IR Post-Procedure Guidelines:  Day + 1 (next AM)   Other considerations:      Plan:    Resume Lovenox 60mg  (0.5mg /kg) SQ q24h tomorrow at 10a   , PharmD, BCPS Pharmacy: [phone] 10/31/2019 10:33 AM

## 2019-10-31 NOTE — Progress Notes (Signed)
PROGRESS NOTE    Lydia Vasquez  VQX:[id number] DOB: [birth year] DOA: 10/25/2019 PCP: Patient, No Pcp Per   Chief Complaint  Patient presents with   Hematuria    Brief Narrative:  59 year old female with history of cervical cancer status post hysterectomy, depression, anxiety, hypertension hyperlipidemia presented to the ER with hematuria, fatigue, nausea and back pain.  Several days of gross hematuria with progressive fatigue and nausea.  Patient also reported bilateral mid back pain. In the emergency room she was found afebrile, tachycardic, blood pressures were stable.  Chest x-ray with right lower lobe atelectasis.  Leukocytosis with white cell count of 22.8.  Lactic acid 3.6.  COVID-19 negative. Urinalysis with grossly abnormal urine.  CT scan with no hydronephrosis or collection.  Admitted and treated as severe sepsis due to UTI. Patient moved from [place] to [place] about 1 and half year ago and has not taken any of her blood pressure medicine or cholesterol medicines.  She had similar episode about 2 months ago did not need hospitalization.  Assessment & Plan:   Principal Problem:   Sepsis due to urinary tract infection ([place]) Active Problems:   Hypokalemia   Sepsis ([place])   UTI (urinary tract infection)  Direct Hyperbilirubinemia   Jaundice   Hepatic Steatosis: Elevated bili and alk phos, mildly elevated AST - relatively stable today RUQ [place] with cholelithiasis without findings c/w cholecystitis Follow monospot - negative Follow MRCP - notable for hepatomegaly and diffuse hepatic steatosis.  No obstructive cause for jaundice identified.  Gallbladder wall thickening (up to 0.6 cm) with multiple small gallstones.  Traces ascites.   Will consult gastroenterology - awaiting AMA (negative) and ANA (negative).  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  Concern for possible alcoholic hepatitis. Consider prednisolone [facility]  discriminant function 39.8, it drops to 26 when using upper limit of normal for our lab 15 instead of 12), will defer to GI.  Severe sepsis secondary to UTI   E. Coli UTI: Present on admission. Received resuscitation fluid, lactic downtrending Urine cx with e. Coli sensitive to ancef, continue anitbiotics (7 days) Follow blood cultures - NGTD Sepsis physiology improved  Hypokalemia: replace and follow.  Follow mag.  Hypertension: BP stable, continue to monitor  Hyperlipidemia with hepatic steatosis: elevated triglycerides, LDL not calculated.  HDL <10.  Continue to follow.  History of cervical cancer status post total hysterectomy: In 2012.  CT scan with no evidence of recurrent cancer or obstruction.  Abnormal CXR: repeat 10/7 with persistent bibasilar atelectasis/infiltrates.  Small L pleural effusion, possible.  Continue to monitor.  DVT prophylaxis: lovenox Code Status: full code Family Communication: none at bedside - daughter 10/7 and 10/8 Disposition:   Status is: Inpatient  Remains inpatient appropriate because:Inpatient level of care appropriate due to severity of illness   Dispo: The patient is from: Home              Anticipated d/c is to: Home              Anticipated d/c date is: > 3 days              Patient currently is not medically stable to d/c. Consultants:   none  Procedures:   none  Antimicrobials: Anti-infectives (From admission, onward)   Start     Dose/Rate Route Frequency Ordered Stop   10/26/19 1200  ceFAZolin (ANCEF) IVPB 1 g/50 mL premix        1  g 100 mL/hr over 30 Minutes Intravenous Every 8 hours 10/26/19 1044 10/30/19 2103   10/25/19 0330  azithromycin (ZITHROMAX) 500 mg in sodium chloride 0.9 % 250 mL IVPB        500 mg 250 mL/hr over 60 Minutes Intravenous  Once 10/25/19 0321 10/25/19 0617   10/25/19 0145  cefTRIAXone (ROCEPHIN) 1 g in sodium chloride 0.9 % 100 mL IVPB  Status:  Discontinued        1 g 200 mL/hr over 30 Minutes  Intravenous Every 24 hours 10/25/19 0132 10/26/19 1024     Subjective: No new complaints Anxious   Objective: Vitals:   10/31/19 0940 10/31/19 0945 10/31/19 1056 10/31/19 1203  BP: 115/84 106/73 103/61 108/68  Pulse: 99 99 (!) 102 (!) 101  Resp: 19 (!) 22 20   Temp:   98.1 F (36.7 C) 98.1 F (36.7 C)  TempSrc:   Oral Oral  SpO2: 100% 100% 92% 94%  Weight:      Height:        Intake/Output Summary (Last 24 hours) at 10/31/2019 1635 Last data filed at 10/31/2019 1222 Gross per 24 hour  Intake 600 ml  Output --  Net 600 ml   Filed Weights   10/27/19 0600 10/28/19 0500 10/31/19 0500  Weight: 121 kg 121.4 kg 124.1 kg    Examination:  General: No acute distress. Scleral icterus Cardiovascular: RRR Lungs: unlabored Abdomen: Soft, nontender, nondistended Neurological: Alert and oriented 3. Moves all extremities 4. Cranial nerves II through XII grossly intact. Skin: Warm and dry. No rashes or lesions. Extremities: No clubbing or cyanosis. No edema.  Data Reviewed: I have personally reviewed following labs and imaging studies  CBC: Recent Labs  Lab 10/27/19 0519 10/28/19 0426 10/29/19 0525 10/30/19 0539 10/31/19 0531  WBC 16.4* 15.4* 15.7* 15.8* 15.7*  NEUTROABS 12.5* 12.2* 12.3* 12.4* 12.4*  HGB 10.1* 10.1* 9.8* 10.1* 10.0*  HCT 30.1* 30.4* 28.5* 29.9* 29.7*  MCV 102.0* 104.8* 102.9* 103.8* 102.1*  PLT 385 379 354 378 833    Basic Metabolic Panel: Recent Labs  Lab 10/26/19 0559 10/26/19 0559 10/27/19 0519 10/28/19 0426 10/29/19 0525 10/30/19 0539 10/31/19 0531  NA 139   < > 137 135 138 135 135  K 3.6   < > 2.9* 3.5 3.2* 3.7 3.7  CL 98   < > 97* 98 100 99 101  CO2 30   < > '28 27 26 27 23  ' GLUCOSE 93   < > 104* 130* 101* 118* 97  BUN <5*   < > 5* '6 7 7 7  ' CREATININE 0.45   < > 0.41* 0.46 0.33* 0.42* 0.36*  CALCIUM 8.5*   < > 8.6* 8.4* 8.7* 8.5* 8.5*  MG 1.8  --  1.7  --  1.9 1.8 1.7  PHOS 2.7  --   --   --  3.3 3.3 3.6   < > = values in this  interval not displayed.    GFR: Estimated Creatinine Clearance: 112.8 mL/min (Izek Corvino) (by C-G formula based on SCr of 0.36 mg/dL (L)).  Liver Function Tests: Recent Labs  Lab 10/27/19 0519 10/28/19 0426 10/29/19 0525 10/30/19 0539 10/31/19 0531  AST 80* 85* 74* 86* 82*  ALT '18 15 15 16 17  ' ALKPHOS 138* 138* 142* 150* 146*  BILITOT 11.9* 11.8* 11.4* 12.2* 11.5*  PROT 6.7 6.5 6.3* 6.6 6.5  ALBUMIN 2.1* 2.0* 1.9* 2.0* 2.0*    CBG: No results for input(s): GLUCAP in the last  168 hours.   Recent Results (from the past 240 hour(s))  Urine Culture     Status: Abnormal   Collection Time: 10/25/19 12:39 AM   Specimen: Urine, Clean Catch  Result Value Ref Range Status   Specimen Description   Final    URINE, CLEAN CATCH Performed at [facility], [place] [address], [place], [place] [address]    Special Requests   Final    NONE Performed at [facility], [place] [address] [place], [place] [address]    Culture >=100,000 COLONIES/mL ESCHERICHIA COLI (Tressia Labrum)  Final   Report Status 10/26/2019 FINAL  Final   Organism ID, Bacteria ESCHERICHIA COLI (Isiaih Hollenbach)  Final      Susceptibility   Escherichia coli - MIC*    AMPICILLIN >=32 RESISTANT Resistant     CEFAZOLIN <=4 SENSITIVE Sensitive     CEFTRIAXONE <=0.25 SENSITIVE Sensitive     CIPROFLOXACIN >=4 RESISTANT Resistant     GENTAMICIN >=16 RESISTANT Resistant     IMIPENEM <=0.25 SENSITIVE Sensitive     NITROFURANTOIN <=16 SENSITIVE Sensitive     TRIMETH/SULFA <=20 SENSITIVE Sensitive     AMPICILLIN/SULBACTAM 16 INTERMEDIATE Intermediate     PIP/TAZO <=4 SENSITIVE Sensitive     * >=100,000 COLONIES/mL ESCHERICHIA COLI  Culture, blood (single)     Status: None   Collection Time: 10/25/19  1:59 AM   Specimen: BLOOD  Result Value Ref Range Status   Specimen Description   Final    BLOOD RIGHT ANTECUBITAL Performed at [place] [address], [place], [place] [address]    Special  Requests   Final    BOTTLES DRAWN AEROBIC AND ANAEROBIC Blood Culture adequate volume Performed at [place] [address], [place], [place] [address]    Culture   Final    NO GROWTH 5 DAYS Performed at [facility], [place] [address], [place], [place] [address]    Report Status 10/30/2019 FINAL  Final  Respiratory Panel by RT PCR (Flu Laiklyn Pilkenton&B, Covid) - Nasopharyngeal Swab     Status: None   Collection Time: 10/25/19  3:45 AM   Specimen: Nasopharyngeal Swab  Result Value Ref Range Status   SARS Coronavirus 2 by RT PCR NEGATIVE NEGATIVE Final    Comment: (NOTE) SARS-CoV-2 target nucleic acids are NOT DETECTED.  The SARS-CoV-2 RNA is generally detectable in upper respiratoy specimens during the acute phase of infection. The lowest concentration of SARS-CoV-2 viral copies this assay can detect is 131 copies/mL. Azaria Stegman negative result does not preclude SARS-Cov-2 infection and should not be used as the sole basis for treatment or other patient management decisions. Litzi Binning negative result may occur with  improper specimen collection/handling, submission of specimen other than nasopharyngeal swab, presence of viral mutation(s) within the areas targeted by this assay, and inadequate number of viral copies (<131 copies/mL). Travin Marik negative result must be combined with clinical observations, patient history, and epidemiological information. The expected result is Negative.  Fact Sheet for Patients:  PinkCheek.be  Fact Sheet for Healthcare Providers:  GravelBags.it  This test is no t yet approved or cleared by the [place] FDA and  has been authorized for detection and/or diagnosis of SARS-CoV-2 by FDA under an Emergency Use Authorization (EUA). This EUA will remain  in effect (meaning this test can be used) for the duration of the COVID-19 declaration under Section 564(b)(1) of the Act, 21 U.S.C. section  360bbb-3(b)(1), unless the authorization is terminated or revoked  sooner.     Influenza Claudette Wermuth by PCR NEGATIVE NEGATIVE Final   Influenza B by PCR NEGATIVE NEGATIVE Final    Comment: (NOTE) The Xpert Xpress SARS-CoV-2/FLU/RSV assay is intended as an aid in  the diagnosis of influenza from Nasopharyngeal swab specimens and  should not be used as Denaya Horn sole basis for treatment. Nasal washings and  aspirates are unacceptable for Xpert Xpress SARS-CoV-2/FLU/RSV  testing.  Fact Sheet for Patients: PinkCheek.be  Fact Sheet for Healthcare Providers: GravelBags.it  This test is not yet approved or cleared by the [place] FDA and  has been authorized for detection and/or diagnosis of SARS-CoV-2 by  FDA under an Emergency Use Authorization (EUA). This EUA will remain  in effect (meaning this test can be used) for the duration of the  Covid-19 declaration under Section 564(b)(1) of the Act, 21  U.S.C. section 360bbb-3(b)(1), unless the authorization is  terminated or revoked. Performed at [facility], [place] [address], [place], [place] [address]          Radiology Studies: US BIOPSY (LIVER)  Result Date: 10/31/2019 INDICATION: Elevated LFTs of uncertain etiology. Please perform ultrasound-guided biopsy for tissue diagnostic purposes. EXAM: ULTRASOUND GUIDED LIVER BIOPSY COMPARISON:  Right upper quadrant abdominal ultrasound-10/25/2019; abdominal MRI-10/27/2018 MEDICATIONS: None ANESTHESIA/SEDATION: Fentanyl 100 mcg IV; Versed 3 mg IV Total Moderate Sedation time: 12 minutes; The patient was continuously monitored during the procedure by the interventional radiology nurse under my direct supervision. COMPLICATIONS: None immediate. PROCEDURE: Informed written consent was obtained from the patient after Lilac Hoff discussion of the risks, benefits and alternatives to treatment. The patient understands and consents the procedure. Melbert Botelho  timeout was performed prior to the initiation of the procedure. Ultrasound scanning was performed of the right upper abdominal quadrant and the procedure was planned. The right upper abdomen was prepped and draped in the usual sterile fashion. The overlying soft tissues were anesthetized with 1% lidocaine with epinephrine. Clydette Privitera 17 gauge, 6.8 cm co-axial needle was advanced into Yaxiel Minnie peripheral aspect of the right lobe of the liver and 3 core biopsies were obtained with an 18 gauge core device under direct ultrasound guidance. The co-axial needle track was embolized with the administration of Jethro Radke Gel-Foam slurry. Superficial hemostasis was obtained with manual compression. Post procedural scanning was negative for definitive area of hemorrhage. Allecia Bells dressing was placed. The patient tolerated the procedure well without immediate post procedural complication. IMPRESSION: Technically successful ultrasound guided liver biopsy. Electronically Signed   By: Sandi Mariscal M.D.   On: 10/31/2019 11:53        Scheduled Meds:  [START ON 11/01/2019] enoxaparin (LOVENOX) injection  60 mg Subcutaneous Q24H   famotidine  20 mg Oral BID   fentaNYL       gelatin adsorbable       lidocaine-EPINEPHrine       midazolam       sodium chloride flush  3 mL Intravenous Q12H   Continuous Infusions:    LOS: 6 days    Time spent: over 30 min    Fayrene Helper, MD [facility]   To contact the attending provider between 7A-7P or the covering provider during after hours 7P-7A, please log into the web site [email or web address] and access using universal [place] password for that web site. If you do not have the password, please call the hospital operator.  10/31/2019, 4:35 PM

## 2019-10-31 NOTE — Plan of Care (Signed)
  Problem: Health Behavior/Discharge Planning: Goal: Ability to manage health-related needs will improve Outcome: Progressing   Problem: Clinical Measurements: Goal: Ability to maintain clinical measurements within normal limits will improve Outcome: Progressing Goal: Will remain free from infection Outcome: Progressing Goal: Diagnostic test results will improve Outcome: Progressing   Problem: Activity: Goal: Risk for activity intolerance will decrease Outcome: Progressing   Problem: Coping: Goal: Level of anxiety will decrease Outcome: Progressing   Problem: Pain Managment: Goal: General experience of comfort will improve Outcome: Progressing   Problem: Safety: Goal: Ability to remain free from injury will improve Outcome: Progressing   

## 2019-11-01 ENCOUNTER — Other Ambulatory Visit: Payer: Self-pay

## 2019-11-01 ENCOUNTER — Inpatient Hospital Stay (HOSPITAL_COMMUNITY): Payer: Medicaid Other

## 2019-11-01 DIAGNOSIS — R609 Edema, unspecified: Secondary | ICD-10-CM | POA: Diagnosis not present

## 2019-11-01 DIAGNOSIS — R6520 Severe sepsis without septic shock: Secondary | ICD-10-CM | POA: Diagnosis not present

## 2019-11-01 DIAGNOSIS — J9811 Atelectasis: Secondary | ICD-10-CM | POA: Diagnosis not present

## 2019-11-01 DIAGNOSIS — R17 Unspecified jaundice: Secondary | ICD-10-CM | POA: Diagnosis not present

## 2019-11-01 DIAGNOSIS — Z20822 Contact with and (suspected) exposure to covid-19: Secondary | ICD-10-CM | POA: Diagnosis not present

## 2019-11-01 DIAGNOSIS — N390 Urinary tract infection, site not specified: Secondary | ICD-10-CM | POA: Diagnosis not present

## 2019-11-01 DIAGNOSIS — D539 Nutritional anemia, unspecified: Secondary | ICD-10-CM | POA: Diagnosis not present

## 2019-11-01 DIAGNOSIS — I10 Essential (primary) hypertension: Secondary | ICD-10-CM | POA: Diagnosis not present

## 2019-11-01 DIAGNOSIS — I472 Ventricular tachycardia: Secondary | ICD-10-CM | POA: Diagnosis not present

## 2019-11-01 DIAGNOSIS — K7031 Alcoholic cirrhosis of liver with ascites: Secondary | ICD-10-CM | POA: Diagnosis not present

## 2019-11-01 DIAGNOSIS — A4151 Sepsis due to Escherichia coli [E. coli]: Secondary | ICD-10-CM | POA: Diagnosis not present

## 2019-11-01 DIAGNOSIS — K760 Fatty (change of) liver, not elsewhere classified: Secondary | ICD-10-CM | POA: Diagnosis not present

## 2019-11-01 DIAGNOSIS — D75839 Thrombocytosis, unspecified: Secondary | ICD-10-CM | POA: Diagnosis not present

## 2019-11-01 DIAGNOSIS — A419 Sepsis, unspecified organism: Secondary | ICD-10-CM | POA: Diagnosis not present

## 2019-11-01 DIAGNOSIS — E876 Hypokalemia: Secondary | ICD-10-CM | POA: Diagnosis not present

## 2019-11-01 LAB — CBC WITH DIFFERENTIAL/PLATELET
Abs Immature Granulocytes: 0.13 10*3/uL — ABNORMAL HIGH (ref 0.00–0.07)
Basophils Absolute: 0.1 10*3/uL (ref 0.0–0.1)
Basophils Relative: 0 %
Eosinophils Absolute: 0.1 10*3/uL (ref 0.0–0.5)
Eosinophils Relative: 1 %
HCT: 28.4 % — ABNORMAL LOW (ref 36.0–46.0)
Hemoglobin: 9.6 g/dL — ABNORMAL LOW (ref 12.0–15.0)
Immature Granulocytes: 1 %
Lymphocytes Relative: 13 %
Lymphs Abs: 2.1 10*3/uL (ref 0.7–4.0)
MCH: 34.4 pg — ABNORMAL HIGH (ref 26.0–34.0)
MCHC: 33.8 g/dL (ref 30.0–36.0)
MCV: 101.8 fL — ABNORMAL HIGH (ref 80.0–100.0)
Monocytes Absolute: 0.8 10*3/uL (ref 0.1–1.0)
Monocytes Relative: 5 %
Neutro Abs: 13.1 10*3/uL — ABNORMAL HIGH (ref 1.7–7.7)
Neutrophils Relative %: 80 %
Platelets: 345 10*3/uL (ref 150–400)
RBC: 2.79 MIL/uL — ABNORMAL LOW (ref 3.87–5.11)
RDW: 18.6 % — ABNORMAL HIGH (ref 11.5–15.5)
WBC: 16.3 10*3/uL — ABNORMAL HIGH (ref 4.0–10.5)
nRBC: 0 % (ref 0.0–0.2)

## 2019-11-01 LAB — BASIC METABOLIC PANEL
Anion gap: 11 (ref 5–15)
BUN: 9 mg/dL (ref 6–20)
CO2: 24 mmol/L (ref 22–32)
Calcium: 8.7 mg/dL — ABNORMAL LOW (ref 8.9–10.3)
Chloride: 102 mmol/L (ref 98–111)
Creatinine, Ser: 0.36 mg/dL — ABNORMAL LOW (ref 0.44–1.00)
GFR, Estimated: >60 mL/min (ref >60)
Glucose, Bld: 107 mg/dL — ABNORMAL HIGH (ref 70–99)
Potassium: 3.7 mmol/L (ref 3.5–5.1)
Sodium: 137 mmol/L (ref 135–145)

## 2019-11-01 LAB — ECHOCARDIOGRAM COMPLETE
Area-P 1/2: 3.53 cm2
Height: 71 in
S' Lateral: 3.15 cm
Weight: 4377.45 oz

## 2019-11-01 LAB — HEPATIC FUNCTION PANEL
ALT: 16 U/L (ref 0–44)
AST: 81 U/L — ABNORMAL HIGH (ref 15–41)
Albumin: 1.9 g/dL — ABNORMAL LOW (ref 3.5–5.0)
Alkaline Phosphatase: 156 U/L — ABNORMAL HIGH (ref 38–126)
Bilirubin, Direct: 7 mg/dL — ABNORMAL HIGH (ref 0.0–0.2)
Indirect Bilirubin: 5 mg/dL — ABNORMAL HIGH (ref 0.3–0.9)
Total Bilirubin: 12 mg/dL — ABNORMAL HIGH (ref 0.3–1.2)
Total Protein: 6.4 g/dL — ABNORMAL LOW (ref 6.5–8.1)

## 2019-11-01 LAB — ANTI-SMOOTH MUSCLE ANTIBODY, IGG: F-Actin IgG: 9 Units (ref 0–19)

## 2019-11-01 LAB — TROPONIN I (HIGH SENSITIVITY)
Troponin I (High Sensitivity): 9 ng/L (ref <18)
Troponin I (High Sensitivity): 15 ng/L (ref <18)

## 2019-11-01 LAB — MITOCHONDRIAL ANTIBODIES: Mitochondrial M2 Ab, IgG: <20 Units (ref 0.0–20.0)

## 2019-11-01 LAB — MAGNESIUM: Magnesium: 1.8 mg/dL (ref 1.7–2.4)

## 2019-11-01 LAB — SURGICAL PATHOLOGY

## 2019-11-01 LAB — PHOSPHORUS: Phosphorus: 3.9 mg/dL (ref 2.5–4.6)

## 2019-11-01 LAB — BRAIN NATRIURETIC PEPTIDE: B Natriuretic Peptide: 189.4 pg/mL — ABNORMAL HIGH (ref 0.0–100.0)

## 2019-11-01 LAB — PROTIME-INR
INR: 1.5 — ABNORMAL HIGH (ref 0.8–1.2)
Prothrombin Time: 17.6 seconds — ABNORMAL HIGH (ref 11.4–15.2)

## 2019-11-01 MED ORDER — POTASSIUM CHLORIDE CRYS ER 20 MEQ PO TBCR
40.0000 meq | EXTENDED_RELEASE_TABLET | Freq: Once | ORAL | Status: AC
  Administered 2019-11-01: 40 meq via ORAL
  Filled 2019-11-01: qty 2

## 2019-11-01 MED ORDER — METOPROLOL TARTRATE 25 MG PO TABS
12.5000 mg | ORAL_TABLET | Freq: Two times a day (BID) | ORAL | Status: DC
  Administered 2019-11-01 (×2): 12.5 mg via ORAL
  Filled 2019-11-01 (×2): qty 1

## 2019-11-01 MED ORDER — ALBUMIN HUMAN 25 % IV SOLN
25.0000 g | Freq: Once | INTRAVENOUS | Status: AC
  Administered 2019-11-01: 25 g via INTRAVENOUS
  Filled 2019-11-01: qty 100

## 2019-11-01 MED ORDER — MAGNESIUM SULFATE 2 GM/50ML IV SOLN
2.0000 g | Freq: Once | INTRAVENOUS | Status: AC
  Administered 2019-11-01: 2 g via INTRAVENOUS
  Filled 2019-11-01: qty 50

## 2019-11-01 MED ORDER — POTASSIUM CHLORIDE CRYS ER 20 MEQ PO TBCR: 40.0000 meq | EXTENDED_RELEASE_TABLET | Freq: Once | ORAL | Status: DC

## 2019-11-01 MED ORDER — FUROSEMIDE 10 MG/ML IJ SOLN
40.0000 mg | Freq: Once | INTRAMUSCULAR | Status: AC
  Administered 2019-11-01: 40 mg via INTRAVENOUS
  Filled 2019-11-01: qty 4

## 2019-11-01 MED ORDER — FUROSEMIDE 10 MG/ML IJ SOLN: 40.0000 mg | Freq: Every day | INTRAMUSCULAR | Status: DC

## 2019-11-01 NOTE — Progress Notes (Signed)
°  Echocardiogram 2D Echocardiogram  has been performed.  Leta Jungling M 11/01/2019, 2:05 PM

## 2019-11-01 NOTE — Plan of Care (Signed)
  Problem: Health Behavior/Discharge Planning: Goal: Ability to manage health-related needs will improve Outcome: Progressing   Problem: Clinical Measurements: Goal: Ability to maintain clinical measurements within normal limits will improve Outcome: Progressing Goal: Will remain free from infection Outcome: Progressing Goal: Diagnostic test results will improve Outcome: Progressing Goal: Respiratory complications will improve Outcome: Progressing Goal: Cardiovascular complication will be avoided Outcome: Progressing   Problem: Activity: Goal: Risk for activity intolerance will decrease Outcome: Progressing   Problem: Nutrition: Goal: Adequate nutrition will be maintained Outcome: Progressing   Problem: Coping: Goal: Level of anxiety will decrease Outcome: Progressing   Problem: Elimination: Goal: Will not experience complications related to bowel motility Outcome: Progressing   Problem: Pain Managment: Goal: General experience of comfort will improve Outcome: Progressing   Problem: Safety: Goal: Ability to remain free from injury will improve Outcome: Progressing   Problem: Fluid Volume: Goal: Hemodynamic stability will improve Outcome: Progressing   Problem: Clinical Measurements: Goal: Diagnostic test results will improve Outcome: Progressing Goal: Signs and symptoms of infection will decrease Outcome: Progressing

## 2019-11-01 NOTE — Progress Notes (Signed)
Pt had runs of Vtach. States "I felt funny like I was having a heart attack". No complaints of chest pain. EKG performed. MD notified. Will continue to monitor.

## 2019-11-01 NOTE — Consult Note (Signed)
CARDIOLOGY CONSULT NOTE       Patient ID: Tonni Mansour MRN: [id number] DOB/AGE: 57/28/1962 57 y.o.  Admit date: 10/25/2019 Referring Physician: Lowell Guitar Primary Physician: Patient, No Pcp Per Primary Cardiologist: New Reason for Consultation: NSVT  Principal Problem:   Sepsis due to urinary tract infection (HCC) Active Problems:   Hypokalemia   Sepsis (HCC)   UTI (urinary tract infection)   HPI:  59 y.o. from [place] admitted with sepsis, anemia and jaundice. Asked to see for NSVT. She has no cardiac history. Does have HLD and HTN that have not been well Rx. Her bilirubin has been over 11. She drinks Vodka likely daily hepatitis panel negative with fatty liver biopsy pending . Her baseline ECG is normal including QT. No history of palpitations, syncope high risk family history CAD, MI or CHF. Telemetry review shows salvos of NSVT one lasting 45 beats and patient " felt like I was going to have a heart attack"  K/Mg normal Started on beta blocker Reviewed her echo from today. EF normal 60-65% with no LVH, no RWMA;s normal RV size and function and no significant valve disease. Currently having LE venous duplex at bedside negative for DVT.   ROS All other systems reviewed and negative except as noted above  Past Medical History:  Diagnosis Date  . Anxiety   . Depression   . Hyperlipidemia   . Hypertension     History reviewed. No pertinent family history.  Social History   Socioeconomic History  . Marital status: Single    Spouse name: Not on file  . Number of children: Not on file  . Years of education: Not on file  . Highest education level: Not on file  Occupational History  . Not on file  Tobacco Use  . Smoking status: Never Smoker  . Smokeless tobacco: Never Used  Substance and Sexual Activity  . Alcohol use: Never  . Drug use: Never  . Sexual activity: Not on file  Other Topics Concern  . Not on file  Social History Narrative  . Not on file   Social Determinants  of Health   Financial Resource Strain:   . Difficulty of Paying Living Expenses: Not on file  Food Insecurity:   . Worried About Programme researcher, broadcasting/film/video in the Last Year: Not on file  . Ran Out of Food in the Last Year: Not on file  Transportation Needs:   . Lack of Transportation (Medical): Not on file  . Lack of Transportation (Non-Medical): Not on file  Physical Activity:   . Days of Exercise per Week: Not on file  . Minutes of Exercise per Session: Not on file  Stress:   . Feeling of Stress : Not on file  Social Connections:   . Frequency of Communication with Friends and Family: Not on file  . Frequency of Social Gatherings with Friends and Family: Not on file  . Attends Religious Services: Not on file  . Active Member of Clubs or Organizations: Not on file  . Attends Banker Meetings: Not on file  . Marital Status: Not on file  Intimate Partner Violence:   . Fear of Current or Ex-Partner: Not on file  . Emotionally Abused: Not on file  . Physically Abused: Not on file  . Sexually Abused: Not on file    Past Surgical History:  Procedure Laterality Date  . ABDOMINAL HYSTERECTOMY        Current Facility-Administered Medications:  .  albumin human 25 %  solution 25 g, 25 g, Intravenous, Once, Zigmund Daniel., MD .  enoxaparin (LOVENOX) injection 60 mg, 60 mg, Subcutaneous, Q24H, Zigmund Daniel., MD, 60 mg at 11/01/19 1208 .  famotidine (PEPCID) tablet 20 mg, 20 mg, Oral, BID, Zigmund Daniel., MD, 20 mg at 11/01/19 1207 .  furosemide (LASIX) injection 40 mg, 40 mg, Intravenous, Once, Zigmund Daniel., MD .  HYDROcodone-acetaminophen (NORCO/VICODIN) 5-325 MG per tablet 1-2 tablet, 1-2 tablet, Oral, Q4H PRN, Opyd, Lavone Neri, MD, 2 tablet at 10/31/19 2313 .  metoprolol tartrate (LOPRESSOR) tablet 12.5 mg, 12.5 mg, Oral, BID, Zigmund Daniel., MD, 12.5 mg at 11/01/19 1207 .  ondansetron (ZOFRAN) tablet 4 mg, 4 mg, Oral, Q6H PRN **OR**  ondansetron (ZOFRAN) injection 4 mg, 4 mg, Intravenous, Q6H PRN, Opyd, Timothy S, MD .  potassium chloride SA (KLOR-CON) CR tablet 40 mEq, 40 mEq, Oral, Once, Zigmund Daniel., MD .  senna-docusate (Senokot-S) tablet 1 tablet, 1 tablet, Oral, QHS PRN, Opyd, Timothy S, MD .  sodium chloride flush (NS) 0.9 % injection 3 mL, 3 mL, Intravenous, Q12H, Opyd, Lavone Neri, MD, 3 mL at 11/01/19 1216 . enoxaparin (LOVENOX) injection  60 mg Subcutaneous Q24H  . famotidine  20 mg Oral BID  . furosemide  40 mg Intravenous Once  . metoprolol tartrate  12.5 mg Oral BID  . potassium chloride  40 mEq Oral Once  . sodium chloride flush  3 mL Intravenous Q12H   . albumin human      Physical Exam: Blood pressure 122/75, pulse (!) 103, temperature 98.5 F (36.9 C), temperature source Oral, resp. rate 20, height 5\' 11"  (1.803 m), weight 124.1 kg, SpO2 96 %.   Affect appropriate Chronically ill black female  Sclera Jaundice  Neck supple with no adenopathy JVP normal no bruits no thyromegaly Lungs clear with no wheezing and good diaphragmatic motion Heart:  S1/S2 no murmur, no rub, gallop or click PMI normal Abdomen: tender over RUQ biopsy site  no bruit.  No HSM or HJR Distal pulses intact with no bruits Plus 2 LE  edema Neuro non-focal Skin warm and dry No muscular weakness   Labs:   Lab Results  Component Value Date   WBC 16.3 (H) 11/01/2019   HGB 9.6 (L) 11/01/2019   HCT 28.4 (L) 11/01/2019   MCV 101.8 (H) 11/01/2019   PLT 345 11/01/2019    Recent Labs  Lab 11/01/19 0500  NA 137  K 3.7  CL 102  CO2 24  BUN 9  CREATININE 0.36*  CALCIUM 8.7*  PROT 6.4*  BILITOT 12.0*  ALKPHOS 156*  ALT 16  AST 81*  GLUCOSE 107*   No results found for: CKTOTAL, CKMB, CKMBINDEX, TROPONINI  Lab Results  Component Value Date   CHOL 192 10/26/2019   Lab Results  Component Value Date   HDL <10 (L) 10/26/2019   Lab Results  Component Value Date   LDLCALC NOT CALCULATED 10/26/2019    Lab Results  Component Value Date   TRIG 332 (H) 10/26/2019   Lab Results  Component Value Date   CHOLHDL NOT CALCULATED 10/26/2019   No results found for: LDLDIRECT    Radiology: CT ABDOMEN PELVIS W CONTRAST  Result Date: 10/25/2019 CLINICAL DATA:  Abdominal abscess/infection suspected EXAM: CT ABDOMEN AND PELVIS WITH CONTRAST TECHNIQUE: Multidetector CT imaging of the abdomen and pelvis was performed using the standard protocol following bolus administration of intravenous contrast. CONTRAST:  OMNIPAQUE IOHEXOL  300 MG/ML  SOLN COMPARISON:  07/05/2019 noncontrast abdominal CT FINDINGS: Lower chest: Atelectasis or scarring at the lung bases, also seen on prior. Hepatobiliary: Hepatic steatosis which is marked.Layering gallstone. No detected acute inflammation or gallbladder over distension. No bile duct dilatation. Pancreas: Unremarkable. Spleen: Nonspecific subcapsular low-density in the upper spleen, likely subtly present on prior. In isolation, usually these lesions are incidental and noncontributory. Adrenals/Urinary Tract: Negative adrenals. No hydronephrosis or stone. Unremarkable bladder. Stomach/Bowel:  No obstruction. No evidence of bowel inflammation. Vascular/Lymphatic: No acute vascular abnormality. Multifocal atherosclerosis, notably extensive for age. No mass or adenopathy. Reproductive:Hysterectomy. Other: No ascites or pneumoperitoneum. Musculoskeletal: No acute abnormalities. IMPRESSION: 1. No acute finding. 2. Severe hepatic steatosis. 3. Cholelithiasis. 4. Atherosclerosis. Electronically Signed   By: Marnee Spring M.D.   On: 10/25/2019 07:09   US BIOPSY (LIVER)  Result Date: 10/31/2019 INDICATION: Elevated LFTs of uncertain etiology. Please perform ultrasound-guided biopsy for tissue diagnostic purposes. EXAM: ULTRASOUND GUIDED LIVER BIOPSY COMPARISON:  Right upper quadrant abdominal ultrasound-10/25/2019; abdominal MRI-10/27/2018 MEDICATIONS: None  ANESTHESIA/SEDATION: Fentanyl 100 mcg IV; Versed 3 mg IV Total Moderate Sedation time: 12 minutes; The patient was continuously monitored during the procedure by the interventional radiology nurse under my direct supervision. COMPLICATIONS: None immediate. PROCEDURE: Informed written consent was obtained from the patient after a discussion of the risks, benefits and alternatives to treatment. The patient understands and consents the procedure. A timeout was performed prior to the initiation of the procedure. Ultrasound scanning was performed of the right upper abdominal quadrant and the procedure was planned. The right upper abdomen was prepped and draped in the usual sterile fashion. The overlying soft tissues were anesthetized with 1% lidocaine with epinephrine. A 17 gauge, 6.8 cm co-axial needle was advanced into a peripheral aspect of the right lobe of the liver and 3 core biopsies were obtained with an 18 gauge core device under direct ultrasound guidance. The co-axial needle track was embolized with the administration of a Gel-Foam slurry. Superficial hemostasis was obtained with manual compression. Post procedural scanning was negative for definitive area of hemorrhage. A dressing was placed. The patient tolerated the procedure well without immediate post procedural complication. IMPRESSION: Technically successful ultrasound guided liver biopsy. Electronically Signed   By: Simonne Come M.D.   On: 10/31/2019 11:53   DG CHEST PORT 1 VIEW  Result Date: 10/27/2019 CLINICAL DATA:  History of abnormal chest x-ray. EXAM: PORTABLE CHEST 1 VIEW COMPARISON:  10/25/2019. FINDINGS: Persistent bibasilar atelectasis/infiltrates with slight improvement in aeration on today's exam. Small left pleural effusion cannot be excluded. No pneumothorax. IMPRESSION: Persistent bibasilar atelectasis/infiltrates with slight improvement in aeration on today's exam. Small left pleural effusion cannot be excluded. Electronically Signed    By: Maisie Fus  Register   On: 10/27/2019 06:02   DG Chest Port 1 View  Result Date: 10/25/2019 CLINICAL DATA:  Sepsis EXAM: PORTABLE CHEST 1 VIEW COMPARISON:  None. FINDINGS: The heart size and mediastinal contours are within normal limits. Shallow degree of aeration with subsegmental atelectasis is seen. There is also patchy airspace opacity seen within the right infrahilar region. Again noted is elevation of the right hemidiaphragm. No acute osseous abnormality. IMPRESSION: Shallow degree of aeration with patchy airspace opacity at the right lung base which could be due to atelectasis and/or early infectious etiology. Electronically Signed   By: Jonna Clark M.D.   On: 10/25/2019 02:34   MR ABDOMEN MRCP W WO CONTAST  Result Date: 10/27/2019 CLINICAL DATA:  Jaundice.  Hepatic steatosis and cholelithiasis. EXAM:  MRI ABDOMEN WITHOUT AND WITH CONTRAST (INCLUDING MRCP) TECHNIQUE: Multiplanar multisequence MR imaging of the abdomen was performed both before and after the administration of intravenous contrast. Heavily T2-weighted images of the biliary and pancreatic ducts were obtained, and three-dimensional MRCP images were rendered by post processing. CONTRAST:  10mL GADAVIST GADOBUTROL 1 MMOL/ML IV SOLN COMPARISON:  CT abdomen 10/25/2019 FINDINGS: Despite efforts by the technologist and patient, motion artifact is present on today's exam and could not be eliminated. This reduces exam sensitivity and specificity. Lower chest: Mild atelectasis along both hemidiaphragms, as on the recent CT. Hepatobiliary: Hepatomegaly with craniocaudad extent of the liver 24.7 cm. Widespread hepatic steatosis, more striking along the margins of the liver. Gallbladder wall thickening up to 0.6 cm, with multiple small gallstones. There is some questionable narrowing of the common hepatic duct for example on image 31/16. I not see definite abnormal enhancement along the walls of the duct in this vicinity, nor is there is  significant amount of upstream biliary dilatation to suggest a significant degree of stenosis/stricture. Pancreas: Unremarkable Spleen: T2 hyperintense 1.6 by 1.3 cm lesion of the upper spleen demonstrates progressive marginal enhancement and is probably a hemangioma. Adrenals/Urinary Tract:  Unremarkable Stomach/Bowel: Unremarkable Vascular/Lymphatic: Aortoiliac atherosclerotic vascular disease. No pathologic adenopathy identified. Other: No supplemental non-categorized findings. Trace ascites along the anterior border of the lateral segment left hepatic lobe. Musculoskeletal: Unremarkable IMPRESSION: 1. Hepatomegaly and diffuse hepatic steatosis. Slightly narrowed appearance of the common bile duct is probably incidental given the lack of upstream dilatation or abnormal wall thickening. No obstructive cause for jaundice is identified; if clinically warranted, nuclear medicine hepatobiliary scan could be utilized to assess for hepatocellular dysfunction. 2. Gallbladder wall thickening up to 0.6 cm, with multiple small gallstones. Although cholecystitis can produce gallbladder wall thickening, so can hypoalbuminemia (the patient currently has an albumin level of 2.1 grams/deciliter). 3. Trace ascites along the anterior border of the lateral segment left hepatic lobe. 4. 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 5. Mild bibasilar atelectasis. 6.  Aortic Atherosclerosis (ICD10-I70.0). Electronically Signed   By: Gaylyn Rong M.D.   On: 10/27/2019 08:53   ECHOCARDIOGRAM COMPLETE  Result Date: 11/01/2019    ECHOCARDIOGRAM REPORT   Patient Name:   CHARNELL PEPLINSKI Date of Exam: 11/01/2019 Medical Rec #:  [id number]     Height:       71.0 in Accession #:    [id number]    Weight:       273.6 lb Date of Birth:  [birth year]     BSA:          2.409 m Patient Age:    59 years      BP:           122/75 mmHg Patient Gender: F             HR:           101 bpm. Exam Location:  Inpatient Procedure: 2D Echo Indications:     Ventricular Tachycardia I47.2  History:        Patient has no prior history of Echocardiogram examinations.                 Risk Factors:Hypertension and Dyslipidemia. Sepsis due to                 urinary tract infection, hepatic steatosis, past history of                 cancer.  Sonographer:  Leta Jungling RDCS Referring Phys: [id number] CALDWELL POWELL JR IMPRESSIONS  1. Left ventricular ejection fraction, by estimation, is 60 to 65%. The left ventricle has normal function. The left ventricle has no regional wall motion abnormalities. Left ventricular diastolic parameters were normal.  2. Right ventricular systolic function is normal. The right ventricular size is normal.  3. The mitral valve is normal in structure. No evidence of mitral valve regurgitation. No evidence of mitral stenosis.  4. The aortic valve is normal in structure. Aortic valve regurgitation is not visualized. No aortic stenosis is present.  5. The inferior vena cava is normal in size with greater than 50% respiratory variability, suggesting right atrial pressure of 3 mmHg. FINDINGS  Left Ventricle: Left ventricular ejection fraction, by estimation, is 60 to 65%. The left ventricle has normal function. The left ventricle has no regional wall motion abnormalities. The left ventricular internal cavity size was normal in size. There is  no left ventricular hypertrophy. Left ventricular diastolic parameters were normal. Right Ventricle: The right ventricular size is normal. No increase in right ventricular wall thickness. Right ventricular systolic function is normal. Left Atrium: Left atrial size was normal in size. Right Atrium: Right atrial size was normal in size. Pericardium: There is no evidence of pericardial effusion. Mitral Valve: The mitral valve is normal in structure. No evidence of mitral valve regurgitation. No evidence of mitral valve stenosis. Tricuspid Valve: The tricuspid valve is normal in structure. Tricuspid valve  regurgitation is not demonstrated. No evidence of tricuspid stenosis. Aortic Valve: The aortic valve is normal in structure. Aortic valve regurgitation is not visualized. No aortic stenosis is present. Pulmonic Valve: The pulmonic valve was normal in structure. Pulmonic valve regurgitation is not visualized. No evidence of pulmonic stenosis. Aorta: The aortic root is normal in size and structure. Venous: The inferior vena cava is normal in size with greater than 50% respiratory variability, suggesting right atrial pressure of 3 mmHg. IAS/Shunts: No atrial level shunt detected by color flow Doppler.  LEFT VENTRICLE PLAX 2D LVIDd:         4.85 cm  Diastology LVIDs:         3.15 cm  LV e' medial:    11.40 cm/s LV PW:         1.04 cm  LV E/e' medial:  8.7 LV IVS:        1.07 cm  LV e' lateral:   12.90 cm/s LVOT diam:     2.10 cm  LV E/e' lateral: 7.7 LV SV:         79 LV SV Index:   33 LVOT Area:     3.46 cm  RIGHT VENTRICLE RV S prime:     12.60 cm/s TAPSE (M-mode): 1.8 cm LEFT ATRIUM           Index LA diam:      3.50 cm 1.45 cm/m LA Vol (A2C): 26.8 ml 11.13 ml/m LA Vol (A4C): 33.3 ml 13.82 ml/m  AORTIC VALVE LVOT Vmax:   131.00 cm/s LVOT Vmean:  78.500 cm/s LVOT VTI:    0.228 m  AORTA Ao Root diam: 3.40 cm MITRAL VALVE MV Area (PHT): 3.53 cm    SHUNTS MV Decel Time: 215 msec    Systemic VTI:  0.23 m MV E velocity: 99.00 cm/s  Systemic Diam: 2.10 cm MV A velocity: 98.50 cm/s MV E/A ratio:  1.01 Mihai Croitoru MD Electronically signed by Thurmon Fair MD Signature Date/Time: 11/01/2019/2:47:25 PM    Final  VAS US LOWER EXTREMITY VENOUS (DVT)  Result Date: 11/01/2019  Lower Venous DVT Study Indications: Edema.  Limitations: Body habitus and poor ultrasound/tissue interface. Comparison Study: No prior studies. Performing Technologist: Chanda BusingGregory Collins RVT  Examination Guidelines: A complete evaluation includes B-mode imaging, spectral Doppler, color Doppler, and power Doppler as needed of all accessible  portions of each vessel. Bilateral testing is considered an integral part of a complete examination. Limited examinations for reoccurring indications may be performed as noted. The reflux portion of the exam is performed with the patient in reverse Trendelenburg.  +---------+---------------+---------+-----------+----------+--------------+ RIGHT    CompressibilityPhasicitySpontaneityPropertiesThrombus Aging +---------+---------------+---------+-----------+----------+--------------+ CFV      Full           Yes      Yes                                 +---------+---------------+---------+-----------+----------+--------------+ SFJ      Full                                                        +---------+---------------+---------+-----------+----------+--------------+ FV Prox  Full                                                        +---------+---------------+---------+-----------+----------+--------------+ FV Mid   Full                                                        +---------+---------------+---------+-----------+----------+--------------+ FV DistalFull                                                        +---------+---------------+---------+-----------+----------+--------------+ PFV      Full                                                        +---------+---------------+---------+-----------+----------+--------------+ POP      Full           Yes      Yes                                 +---------+---------------+---------+-----------+----------+--------------+ PTV      Full                                                        +---------+---------------+---------+-----------+----------+--------------+ PERO  Not visualized +---------+---------------+---------+-----------+----------+--------------+   +---------+---------------+---------+-----------+----------+--------------+ LEFT      CompressibilityPhasicitySpontaneityPropertiesThrombus Aging +---------+---------------+---------+-----------+----------+--------------+ CFV      Full           Yes      Yes                                 +---------+---------------+---------+-----------+----------+--------------+ SFJ      Full                                                        +---------+---------------+---------+-----------+----------+--------------+ FV Prox  Full                                                        +---------+---------------+---------+-----------+----------+--------------+ FV Mid   Full                                                        +---------+---------------+---------+-----------+----------+--------------+ FV DistalFull                                                        +---------+---------------+---------+-----------+----------+--------------+ PFV      Full                                                        +---------+---------------+---------+-----------+----------+--------------+ POP      Full           Yes      Yes                                 +---------+---------------+---------+-----------+----------+--------------+ PTV      Full                                                        +---------+---------------+---------+-----------+----------+--------------+ PERO                                                  Not visualized +---------+---------------+---------+-----------+----------+--------------+     Summary: RIGHT: - There is no evidence of deep vein thrombosis in the lower extremity. However, portions of this examination were limited- see technologist comments above.  - No cystic structure found in the popliteal fossa.  LEFT: - There is no evidence of deep vein  thrombosis in the lower extremity. However, portions of this examination were limited- see technologist comments above.  - No cystic structure found in the popliteal  fossa.  *See table(s) above for measurements and observations.    Preliminary    US Abdomen Limited RUQ  Result Date: 10/25/2019 CLINICAL DATA:  Right upper quadrant abdominal pain EXAM: ULTRASOUND ABDOMEN LIMITED RIGHT UPPER QUADRANT COMPARISON:  CT scan 10/25/2019 FINDINGS: Gallbladder: Numerous small echogenic shadowing gallstones noted dependently in the gallbladder. No gallbladder wall thickening, pericholecystic fluid or sonographic Murphy sign to suggest acute cholecystitis. The largest calculus measures 1 cm. Common bile duct: Diameter: 6.0 mm Liver: There is diffuse increased echogenicity of the liver and decreased through transmission consistent with fatty infiltration. No focal lesions or biliary dilatation. Portal vein is patent on color Doppler imaging with normal direction of blood flow towards the liver. Other: None. IMPRESSION: 1. Cholelithiasis without sonographic findings for acute cholecystitis. 2. No intra or extrahepatic biliary dilatation. 3. Advanced fatty infiltration of the liver. Electronically Signed   By: Rudie Meyer M.D.   On: 10/25/2019 16:32    EKG: NSR normal baseline ECG including QT   ASSESSMENT AND PLAN:   1. NSVT:  Discussed with Dr Ladona Ridgel over phone as well. VT has RBBB morphology positive in 3, negative in lead 2 so not classically from LVOT. Would not appear to be from structural heart disease per normal echo and EF. May be fasicular tachycardia or automatic foci in setting of anemia, sepsis and liver failure. For now would just Rx with beta blocker. May need outpatient monitor and cardiac MRI with EP referral  Has had 2 g Mg already. Try to avoid Zofran and other QT prolonging drugs Continue lasix for her LE edema. Etiology of her liver failure pending biopsy.  She needs to abstain from ETOH totally Keep K > 4.0   Signed: Charlton Haws 11/01/2019, 2:57 PM

## 2019-11-01 NOTE — Progress Notes (Signed)
Bilateral lower extremity venous duplex has been completed. Preliminary results can be found in CV Proc through chart review.   11/01/19 2:39 PM Olen Cordial RVT

## 2019-11-01 NOTE — Progress Notes (Signed)
[facility] Progress Note  Lydia Vasquez 59 y.o. 03/29/1960  CC: Jaundice  Subjective: Patient seen and examined at bedside.  No acute issues.  Complaining of right upper quadrant abdominal pain.  Denies nausea or vomiting.   ROS : Review of Systems  Cardiovascular: Negative for chest pain and palpitations.  Gastrointestinal: Positive for abdominal pain. Negative for blood in stool, constipation, diarrhea, heartburn, melena, nausea and vomiting.   Objective: Vital signs in last 24 hours: Vitals:   10/31/19 2205 11/01/19 0532  BP: 124/75 114/61  Pulse: 98 96  Resp: 20 20  Temp: 98.4 F (36.9 C) 98 F (36.7 C)  SpO2: 95% 95%    Physical Exam:  General:  Alert, cooperative, no distress, appears stated age, obese  Head:  Normocephalic, without obvious abnormality, atraumatic  Eyes:  Deep icterus, EOMs intact  Lungs:   Clear to auscultation bilaterally, respirations unlabored  Heart:  Regular rate and rhythm, S1, S2 normal  Abdomen:   Soft and nondistended with mild diffuse right upper quadrant tenderness, bowel sounds active all four quadrants, no guarding or peritoneal signs  Extremities: Extremities normal, atraumatic, no  edema  Pulses: 2+ and symmetric    Lab Results: Recent Labs    10/31/19 0531 11/01/19 0500  NA 135 137  K 3.7 3.7  CL 101 102  CO2 23 24  GLUCOSE 97 107*  BUN 7 9  CREATININE 0.36* 0.36*  CALCIUM 8.5* 8.7*  MG 1.7 1.8  PHOS 3.6 3.9   Recent Labs    10/31/19 0531 11/01/19 0500  AST 82* 81*  ALT 17 16  ALKPHOS 146* 156*  BILITOT 11.5* 12.0*  PROT 6.5 6.4*  ALBUMIN 2.0* 1.9*   Recent Labs    10/31/19 0531 11/01/19 0500  WBC 15.7* 16.3*  NEUTROABS 12.4* 13.1*  HGB 10.0* 9.6*  HCT 29.7* 28.4*  MCV 102.1* 101.8*  PLT 354 345   Recent Labs    10/31/19 0531 11/01/19 0500  LABPROT 17.2* 17.6*  INR 1.5* 1.5*      Assessment: Jaundice.  Could be combination of alcoholic hepatitis and severe steatohepatitis vs  sepsis..  Work-up negative so far.   -Sepsis with UTI - Elevated iron saturation but normal ferritin.  -Acute hepatitis panel negative 10/25/19 -ANA negative, AMA negative 10/8.   -ASMA pending -Ceruloplasmin normal, no alpha 1 antitrypsin deficiency -Liver biopsy 10/11, path pending  Incidental cholelithiasis, gallbladder wall thickening. Cholecystitis vs hypoalbuminemia, albumin 2.0.   Plan: -Follow liver biopsy results -LFTs may take time to trend down. -Recommend repeating iron panel and ferritin in 4 weeks after discharge -Okay to discharge from GI standpoint. -Follow-up with Dr. Marca Ancona in 2 to 4 weeks after discharge. -Absolute alcohol abstinence discussed. -GI will sign off.  Call us back if needed   Keyonni Percival PA-C 11/01/2019, 9:53 AM  Contact #  [phone]

## 2019-11-01 NOTE — Progress Notes (Signed)
PROGRESS NOTE    Lydia Vasquez  QMG:[id number] DOB: 08/20/1960 DOA: 10/25/2019 PCP: Patient, No Pcp Per   Chief Complaint  Patient presents with  . Hematuria    Brief Narrative:  59 year old female with history of cervical cancer status post hysterectomy, depression, anxiety, hypertension hyperlipidemia presented to the ER with hematuria, fatigue, nausea and back pain.  Several days of gross hematuria with progressive fatigue and nausea.  Patient also reported bilateral mid back pain. In the emergency room she was found afebrile, tachycardic, blood pressures were stable.  Chest x-ray with right lower lobe atelectasis.  Leukocytosis with white cell count of 22.8.  Lactic acid 3.6.  COVID-19 negative. Urinalysis with grossly abnormal urine.  CT scan with no hydronephrosis or collection.  Admitted and treated as severe sepsis due to UTI. Patient moved from [place] to [place] about 1 and half year ago and has not taken any of her blood pressure medicine or cholesterol medicines.  She had similar episode about 2 months ago did not need hospitalization.  She was admitted with fatigue, nausea, scleral icterus.  She was found to have sepsis 2/2 UTI as well as jaundice with elevated LFT's.  UTI was treated with abx.  GI was consulted for abnormal LFT's which was thought to be 2/2 alcoholic hepatitis and severe steatohepatitis.  She has pending workup with labs including Essense Bousquet liver biopsy.  Hospitalization c/b vtach on 10/12, workup pending.  Assessment & Plan:   Principal Problem:   Sepsis due to urinary tract infection ([place]) Active Problems:   Hypokalemia   Sepsis ([place])   UTI (urinary tract infection)  Ventricular Tachycardia: approximately 3 min today.  She felt palpitations, denied CP.  EKG shows sinus tachycardia, similar to priors.   Replace mag/k Follow troponin Metoprolol 12.5 mg BID - titrate as tolerated  Direct Hyperbilirubinemia  Jaundice  Hepatic Steatosis: Elevated bili  and alk phos, mildly elevated AST - relatively stable today RUQ [place] with cholelithiasis without findings c/w cholecystitis Follow monospot - negative Follow MRCP - notable for hepatomegaly and diffuse hepatic steatosis.  No obstructive cause for jaundice identified.  Gallbladder wall thickening (up to 0.6 cm) with multiple small gallstones.  Traces ascites.   Will consult gastroenterology - awaiting AMA (negative) and ANA (negative).  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  Concern for possible alcoholic hepatitis. Consider prednisolone [facility] discriminant function 39.8, it drops to 26 when using upper limit of normal for our lab 15 instead of 12), will defer to GI.  Bilateral LE edema: suspect related to volume overload and hypoalbuminemia. weight up from 119.5 kg to 124.1 kg (weight 10/4 was 103.2, but suspect this was inaccurate) with lower extremity edema.  Follow LE [place] and echo.  Start lasix x1 and follow response, will give albumin as well  Severe sepsis secondary to UTI  E. Coli UTI: Present on admission. Received resuscitation fluid, lactic downtrending Urine cx with e. Coli sensitive to ancef, continue anitbiotics (7 days) Follow blood cultures - NGTD Sepsis physiology improved  Hypokalemia: replace and follow   Hypertension: BP stable, continue to monitor  Hyperlipidemia with hepatic steatosis: elevated triglycerides, LDL not calculated.  HDL <10.  Continue to follow.  History of cervical cancer status post total hysterectomy: In 2012.  CT scan with no evidence of recurrent cancer or obstruction.  Abnormal CXR: repeat 10/7 with persistent bibasilar atelectasis/infiltrates.  Small L pleural effusion, possible.  Continue to monitor.  DVT prophylaxis: lovenox  Code Status: full code Family Communication: none at bedside - daughter 10/7 and 10/8 Disposition:   Status is: Inpatient  Remains inpatient appropriate because:Inpatient  level of care appropriate due to severity of illness   Dispo: The patient is from: Home              Anticipated d/c is to: Home              Anticipated d/c date is: > 3 days              Patient currently is not medically stable to d/c. Consultants:   none  Procedures:   none  Antimicrobials: Anti-infectives (From admission, onward)   Start     Dose/Rate Route Frequency Ordered Stop   10/26/19 1200  ceFAZolin (ANCEF) IVPB 1 g/50 mL premix        1 g 100 mL/hr over 30 Minutes Intravenous Every 8 hours 10/26/19 1044 10/30/19 2103   10/25/19 0330  azithromycin (ZITHROMAX) 500 mg in sodium chloride 0.9 % 250 mL IVPB        500 mg 250 mL/hr over 60 Minutes Intravenous  Once 10/25/19 0321 10/25/19 0617   10/25/19 0145  cefTRIAXone (ROCEPHIN) 1 g in sodium chloride 0.9 % 100 mL IVPB  Status:  Discontinued        1 g 200 mL/hr over 30 Minutes Intravenous Every 24 hours 10/25/19 0132 10/26/19 1024     Subjective: C/o palpitations when working with therapy  Objective: Vitals:   10/31/19 2205 11/01/19 0532 11/01/19 1127 11/01/19 1318  BP: 124/75 114/61 120/79 122/75  Pulse: 98 96 (!) 103 (!) 103  Resp: '20 20 20 20  ' Temp: 98.4 F (36.9 C) 98 F (36.7 C)  98.5 F (36.9 C)  TempSrc: Oral Oral  Oral  SpO2: 95% 95% 95% 96%  Weight:      Height:        Intake/Output Summary (Last 24 hours) at 11/01/2019 1401 Last data filed at 11/01/2019 1320 Gross per 24 hour  Intake 120 ml  Output --  Net 120 ml   Filed Weights   10/27/19 0600 10/28/19 0500 10/31/19 0500  Weight: 121 kg 121.4 kg 124.1 kg    Examination:  General: No acute distress. Scleral icterus Cardiovascular: RRR Lungs:unlabored Abdomen: Soft, nontender, nondistended Neurological: Alert and oriented 3. Moves all extremities 4. Cranial nerves II through XII grossly intact. Skin: Warm and dry. No rashes or lesions. Extremities: bilateral LE edeam   Data Reviewed: I have personally reviewed following labs  and imaging studies  CBC: Recent Labs  Lab 10/28/19 0426 10/29/19 0525 10/30/19 0539 10/31/19 0531 11/01/19 0500  WBC 15.4* 15.7* 15.8* 15.7* 16.3*  NEUTROABS 12.2* 12.3* 12.4* 12.4* 13.1*  HGB 10.1* 9.8* 10.1* 10.0* 9.6*  HCT 30.4* 28.5* 29.9* 29.7* 28.4*  MCV 104.8* 102.9* 103.8* 102.1* 101.8*  PLT 379 354 378 354 956    Basic Metabolic Panel: Recent Labs  Lab 10/26/19 0559 10/26/19 0559 10/27/19 0519 10/27/19 0519 10/28/19 0426 10/29/19 0525 10/30/19 0539 10/31/19 0531 11/01/19 0500  NA 139   < > 137   < > 135 138 135 135 137  K 3.6   < > 2.9*   < > 3.5 3.2* 3.7 3.7 3.7  CL 98   < > 97*   < > 98 100 99 101 102  CO2 30   < > 28   < > '27 26 27 23 24  ' GLUCOSE 93   < >  104*   < > 130* 101* 118* 97 107*  BUN <5*   < > 5*   < > '6 7 7 7 9  ' CREATININE 0.45   < > 0.41*   < > 0.46 0.33* 0.42* 0.36* 0.36*  CALCIUM 8.5*   < > 8.6*   < > 8.4* 8.7* 8.5* 8.5* 8.7*  MG 1.8   < > 1.7  --   --  1.9 1.8 1.7 1.8  PHOS 2.7  --   --   --   --  3.3 3.3 3.6 3.9   < > = values in this interval not displayed.    GFR: Estimated Creatinine Clearance: 112.8 mL/min (Karriem Muench) (by C-G formula based on SCr of 0.36 mg/dL (L)).  Liver Function Tests: Recent Labs  Lab 10/28/19 0426 10/29/19 0525 10/30/19 0539 10/31/19 0531 11/01/19 0500  AST 85* 74* 86* 82* 81*  ALT '15 15 16 17 16  ' ALKPHOS 138* 142* 150* 146* 156*  BILITOT 11.8* 11.4* 12.2* 11.5* 12.0*  PROT 6.5 6.3* 6.6 6.5 6.4*  ALBUMIN 2.0* 1.9* 2.0* 2.0* 1.9*    CBG: No results for input(s): GLUCAP in the last 168 hours.   Recent Results (from the past 240 hour(s))  Urine Culture     Status: Abnormal   Collection Time: 10/25/19 12:39 AM   Specimen: Urine, Clean Catch  Result Value Ref Range Status   Specimen Description   Final    URINE, CLEAN CATCH Performed at [facility], [place] [address] [place], [place] [address]    Special Requests   Final    NONE Performed at [facility], [place]  [address] [place], [place] [address]    Culture >=100,000 COLONIES/mL ESCHERICHIA COLI (Deaundre Allston)  Final   Report Status 10/26/2019 FINAL  Final   Organism ID, Bacteria ESCHERICHIA COLI (Samaiya Awadallah)  Final      Susceptibility   Escherichia coli - MIC*    AMPICILLIN >=32 RESISTANT Resistant     CEFAZOLIN <=4 SENSITIVE Sensitive     CEFTRIAXONE <=0.25 SENSITIVE Sensitive     CIPROFLOXACIN >=4 RESISTANT Resistant     GENTAMICIN >=16 RESISTANT Resistant     IMIPENEM <=0.25 SENSITIVE Sensitive     NITROFURANTOIN <=16 SENSITIVE Sensitive     TRIMETH/SULFA <=20 SENSITIVE Sensitive     AMPICILLIN/SULBACTAM 16 INTERMEDIATE Intermediate     PIP/TAZO <=4 SENSITIVE Sensitive     * >=100,000 COLONIES/mL ESCHERICHIA COLI  Culture, blood (single)     Status: None   Collection Time: 10/25/19  1:59 AM   Specimen: BLOOD  Result Value Ref Range Status   Specimen Description   Final    BLOOD RIGHT ANTECUBITAL Performed at [facility] [address] [place], [place] [address]    Special Requests   Final    BOTTLES DRAWN AEROBIC AND ANAEROBIC Blood Culture adequate volume Performed at [facility] [address], [place], [place] [address]    Culture   Final    NO GROWTH 5 DAYS Performed at [facility], [employer] [address] [place], [place] [address]    Report Status 10/30/2019 FINAL  Final  Respiratory Panel by RT PCR (Flu Uri Turnbough&B, Covid) - Nasopharyngeal Swab     Status: None   Collection Time: 10/25/19  3:45 AM   Specimen: Nasopharyngeal Swab  Result Value Ref Range Status   SARS Coronavirus 2 by RT PCR NEGATIVE NEGATIVE Final    Comment: (NOTE) SARS-CoV-2 target nucleic  acids are NOT DETECTED.  The SARS-CoV-2 RNA is generally detectable in upper respiratoy specimens during the acute phase of infection. The lowest concentration of SARS-CoV-2 viral copies this assay can detect is 131 copies/mL. Santos Sollenberger negative result does not preclude SARS-Cov-2 infection and should not  be used as the sole basis for treatment or other patient management decisions. Saw Mendenhall negative result may occur with  improper specimen collection/handling, submission of specimen other than nasopharyngeal swab, presence of viral mutation(s) within the areas targeted by this assay, and inadequate number of viral copies (<131 copies/mL). Myrth Dahan negative result must be combined with clinical observations, patient history, and epidemiological information. The expected result is Negative.  Fact Sheet for Patients:  PinkCheek.be  Fact Sheet for Healthcare Providers:  GravelBags.it  This test is no t yet approved or cleared by the [place] FDA and  has been authorized for detection and/or diagnosis of SARS-CoV-2 by FDA under an Emergency Use Authorization (EUA). This EUA will remain  in effect (meaning this test can be used) for the duration of the COVID-19 declaration under Section 564(b)(1) of the Act, 21 U.S.C. section 360bbb-3(b)(1), unless the authorization is terminated or revoked sooner.     Influenza Ivaan Liddy by PCR NEGATIVE NEGATIVE Final   Influenza B by PCR NEGATIVE NEGATIVE Final    Comment: (NOTE) The Xpert Xpress SARS-CoV-2/FLU/RSV assay is intended as an aid in  the diagnosis of influenza from Nasopharyngeal swab specimens and  should not be used as Siddhanth Denk sole basis for treatment. Nasal washings and  aspirates are unacceptable for Xpert Xpress SARS-CoV-2/FLU/RSV  testing.  Fact Sheet for Patients: PinkCheek.be  Fact Sheet for Healthcare Providers: GravelBags.it  This test is not yet approved or cleared by the [place] FDA and  has been authorized for detection and/or diagnosis of SARS-CoV-2 by  FDA under an Emergency Use Authorization (EUA). This EUA will remain  in effect (meaning this test can be used) for the duration of the  Covid-19 declaration under Section  564(b)(1) of the Act, 21  U.S.C. section 360bbb-3(b)(1), unless the authorization is  terminated or revoked. Performed at [facility], [place] [address], [place], [place] [address]          Radiology Studies: US BIOPSY (LIVER)  Result Date: 10/31/2019 INDICATION: Elevated LFTs of uncertain etiology. Please perform ultrasound-guided biopsy for tissue diagnostic purposes. EXAM: ULTRASOUND GUIDED LIVER BIOPSY COMPARISON:  Right upper quadrant abdominal ultrasound-10/25/2019; abdominal MRI-10/27/2018 MEDICATIONS: None ANESTHESIA/SEDATION: Fentanyl 100 mcg IV; Versed 3 mg IV Total Moderate Sedation time: 12 minutes; The patient was continuously monitored during the procedure by the interventional radiology nurse under my direct supervision. COMPLICATIONS: None immediate. PROCEDURE: Informed written consent was obtained from the patient after Octavis Sheeler discussion of the risks, benefits and alternatives to treatment. The patient understands and consents the procedure. Tatanisha Cuthbert timeout was performed prior to the initiation of the procedure. Ultrasound scanning was performed of the right upper abdominal quadrant and the procedure was planned. The right upper abdomen was prepped and draped in the usual sterile fashion. The overlying soft tissues were anesthetized with 1% lidocaine with epinephrine. Lorien Shingler 17 gauge, 6.8 cm co-axial needle was advanced into Keean Wilmeth peripheral aspect of the right lobe of the liver and 3 core biopsies were obtained with an 18 gauge core device under direct ultrasound guidance. The co-axial needle track was embolized with the administration of Crescencio Jozwiak Gel-Foam slurry. Superficial hemostasis was obtained with manual compression. Post procedural scanning was negative for definitive area of hemorrhage. Mariene Dickerman  dressing was placed. The patient tolerated the procedure well without immediate post procedural complication. IMPRESSION: Technically successful ultrasound guided liver biopsy. Electronically Signed    By: Sandi Mariscal M.D.   On: 10/31/2019 11:53        Scheduled Meds: . enoxaparin (LOVENOX) injection  60 mg Subcutaneous Q24H  . famotidine  20 mg Oral BID  . metoprolol tartrate  12.5 mg Oral BID  . sodium chloride flush  3 mL Intravenous Q12H   Continuous Infusions:    LOS: 7 days    Time spent: over 30 min    Fayrene Helper, MD [facility]   To contact the attending provider between 7A-7P or the covering provider during after hours 7P-7A, please log into the web site [email or web address] and access using universal [place] password for that web site. If you do not have the password, please call the hospital operator.  11/01/2019, 2:01 PM

## 2019-11-01 NOTE — Evaluation (Signed)
Physical Therapy Evaluation Patient Details Name: Lydia Vasquez MRN: [id number] DOB: 1960/10/26 Today's Date: 11/01/2019   History of Present Illness  59 yo female admitted with UTI, Sepsis, hematuria, back p, jaundice. Hx of cervical a s/p hysterectomy, depression, anxiety, ETOH abuse  Clinical Impression  On eval, pt was Min guard assist for mobility. She walked ~165 feet around the unit. One standing rest break taken/needed 2* fatigue, dyspnea, and some abd/chest discomfort. HR up to 122 bpm, O2 95% RA, dyspnea 3/4 with ambulation. Will plan to follow pt during hospital stay. Do not anticipate any f/u PT needs at discharge.     Follow Up Recommendations No PT follow up    Equipment Recommendations  None recommended by PT    Recommendations for Other Services       Precautions / Restrictions Precautions Precautions: Fall Restrictions Weight Bearing Restrictions: No      Mobility  Bed Mobility Overal bed mobility: Modified Independent                Transfers Overall transfer level: Modified independent                  Ambulation/Gait Ambulation/Gait assistance: Min guard Gait Distance (Feet): 165 Feet Assistive device: None (vs hallway handrail) Gait Pattern/deviations: Step-through pattern;Decreased stride length     General Gait Details: Intermittent hallway rail use to steady. Mild unsteadiness but no overt LOB. Pt stated "legs feel funny." HR up to 122 bpm, O2 95% on RA, dyspnea 3/4. 1 brief standing rest break.  Stairs            Wheelchair Mobility    Modified Rankin (Stroke Patients Only)       Balance Overall balance assessment: Mild deficits observed, not formally tested                                           Pertinent Vitals/Pain Pain Assessment: Faces Faces Pain Scale: Hurts little more Pain Location: biopsy site, chest pain Pain Descriptors / Indicators: Discomfort Pain Intervention(s): Limited  activity within patient's tolerance;Monitored during session;Repositioned    Home Living Family/patient expects to be discharged to:: Private residence Living Arrangements: Children Available Help at Discharge: Family Type of Home: Apartment         Home Equipment: None      Prior Function Level of Independence: Independent               Hand Dominance        Extremity/Trunk Assessment   Upper Extremity Assessment Upper Extremity Assessment: Overall WFL for tasks assessed    Lower Extremity Assessment Lower Extremity Assessment: Generalized weakness    Cervical / Trunk Assessment Cervical / Trunk Assessment: Normal  Communication   Communication: No difficulties  Cognition Arousal/Alertness: Awake/alert Behavior During Therapy: WFL for tasks assessed/performed Overall Cognitive Status: Within Functional Limits for tasks assessed                                        General Comments      Exercises     Assessment/Plan    PT Assessment Patient needs continued PT services  PT Problem List Decreased mobility;Decreased activity tolerance;Decreased balance       PT Treatment Interventions Gait training;Therapeutic activities;Therapeutic exercise;Patient/family education;Balance training;Functional mobility training  PT Goals (Current goals can be found in the Care Plan section)  Acute Rehab PT Goals Patient Stated Goal: to feel better PT Goal Formulation: With patient Time For Goal Achievement: 11/15/19 Potential to Achieve Goals: Good    Frequency Min 3X/week   Barriers to discharge        Co-evaluation               AM-PAC PT "6 Clicks" Mobility  Outcome Measure Help needed turning from your back to your side while in a flat bed without using bedrails?: None Help needed moving from lying on your back to sitting on the side of a flat bed without using bedrails?: None Help needed moving to and from a bed to a chair  (including a wheelchair)?: None Help needed standing up from a chair using your arms (e.g., wheelchair or bedside chair)?: A Little Help needed to walk in hospital room?: A Little Help needed climbing 3-5 steps with a railing? : A Little 6 Click Score: 21    End of Session Equipment Utilized During Treatment: Gait belt Activity Tolerance: Patient limited by fatigue (limited by dyspnea) Patient left: in chair;with call bell/phone within reach   PT Visit Diagnosis: Unsteadiness on feet (R26.81);Difficulty in walking, not elsewhere classified (R26.2)    Time: 3664-4034 PT Time Calculation (min) (ACUTE ONLY): 12 min   Charges:   PT Evaluation $PT Eval Low Complexity: 1 Low            Faye Ramsay, PT Acute Rehabilitation  Office: [phone] Pager: [phone]

## 2019-11-01 NOTE — TOC Progression Note (Signed)
Transition of Care [facility]) - Progression Note    Patient Details  Name: Lydia Vasquez MRN: [id number] Date of Birth: [birth year]  Transition of Care [facility]) CM/SW Contact  Geni Bers, RN Phone Number: 11/01/2019, 11:37 AM  Clinical Narrative:     Spoke with pt concerning PCP. Pt has Medicaid [place]. Pt will need to call Social Services or the number on the back of her card to find what PCP are taking her insurance. Explained this to pt who understood. Te[employer] Back was used and pt understood.   Expected Discharge Plan: Home/Self Care Barriers to Discharge: No Barriers Identified  Expected Discharge Plan and Services Expected Discharge Plan: Home/Self Care       Living arrangements for the past 2 months: Single Family Home                                       Social Determinants of Health (SDOH) Interventions    Readmission Risk Interventions No flowsheet data found.

## 2019-11-02 ENCOUNTER — Inpatient Hospital Stay (HOSPITAL_COMMUNITY): Payer: Medicaid Other

## 2019-11-02 DIAGNOSIS — J9811 Atelectasis: Secondary | ICD-10-CM | POA: Diagnosis not present

## 2019-11-02 DIAGNOSIS — I472 Ventricular tachycardia: Secondary | ICD-10-CM | POA: Diagnosis not present

## 2019-11-02 DIAGNOSIS — A419 Sepsis, unspecified organism: Secondary | ICD-10-CM | POA: Diagnosis not present

## 2019-11-02 DIAGNOSIS — N390 Urinary tract infection, site not specified: Secondary | ICD-10-CM | POA: Diagnosis not present

## 2019-11-02 LAB — CBC WITH DIFFERENTIAL/PLATELET
Abs Immature Granulocytes: 0.13 10*3/uL — ABNORMAL HIGH (ref 0.00–0.07)
Basophils Absolute: 0.1 10*3/uL (ref 0.0–0.1)
Basophils Relative: 1 %
Eosinophils Absolute: 0.1 10*3/uL (ref 0.0–0.5)
Eosinophils Relative: 1 %
HCT: 29 % — ABNORMAL LOW (ref 36.0–46.0)
Hemoglobin: 9.7 g/dL — ABNORMAL LOW (ref 12.0–15.0)
Immature Granulocytes: 1 %
Lymphocytes Relative: 14 %
Lymphs Abs: 2.4 10*3/uL (ref 0.7–4.0)
MCH: 33.8 pg (ref 26.0–34.0)
MCHC: 33.4 g/dL (ref 30.0–36.0)
MCV: 101 fL — ABNORMAL HIGH (ref 80.0–100.0)
Monocytes Absolute: 0.9 10*3/uL (ref 0.1–1.0)
Monocytes Relative: 5 %
Neutro Abs: 13.7 10*3/uL — ABNORMAL HIGH (ref 1.7–7.7)
Neutrophils Relative %: 78 %
Platelets: 351 10*3/uL (ref 150–400)
RBC: 2.87 MIL/uL — ABNORMAL LOW (ref 3.87–5.11)
RDW: 18.4 % — ABNORMAL HIGH (ref 11.5–15.5)
WBC: 17.4 10*3/uL — ABNORMAL HIGH (ref 4.0–10.5)
nRBC: 0 % (ref 0.0–0.2)

## 2019-11-02 LAB — PROTIME-INR
INR: 1.5 — ABNORMAL HIGH (ref 0.8–1.2)
Prothrombin Time: 17.4 seconds — ABNORMAL HIGH (ref 11.4–15.2)

## 2019-11-02 LAB — COMPREHENSIVE METABOLIC PANEL
ALT: 17 U/L (ref 0–44)
AST: 86 U/L — ABNORMAL HIGH (ref 15–41)
Albumin: 2.3 g/dL — ABNORMAL LOW (ref 3.5–5.0)
Alkaline Phosphatase: 170 U/L — ABNORMAL HIGH (ref 38–126)
Anion gap: 9 (ref 5–15)
BUN: 10 mg/dL (ref 6–20)
CO2: 24 mmol/L (ref 22–32)
Calcium: 8.4 mg/dL — ABNORMAL LOW (ref 8.9–10.3)
Chloride: 99 mmol/L (ref 98–111)
Creatinine, Ser: 0.47 mg/dL (ref 0.44–1.00)
GFR, Estimated: >60 mL/min (ref >60)
Glucose, Bld: 104 mg/dL — ABNORMAL HIGH (ref 70–99)
Potassium: 3.9 mmol/L (ref 3.5–5.1)
Sodium: 132 mmol/L — ABNORMAL LOW (ref 135–145)
Total Bilirubin: 12.2 mg/dL — ABNORMAL HIGH (ref 0.3–1.2)
Total Protein: 6.8 g/dL (ref 6.5–8.1)

## 2019-11-02 LAB — PHOSPHORUS: Phosphorus: 3.5 mg/dL (ref 2.5–4.6)

## 2019-11-02 LAB — BRAIN NATRIURETIC PEPTIDE: B Natriuretic Peptide: 325.8 pg/mL — ABNORMAL HIGH (ref 0.0–100.0)

## 2019-11-02 LAB — MAGNESIUM: Magnesium: 2.2 mg/dL (ref 1.7–2.4)

## 2019-11-02 MED ORDER — FUROSEMIDE 10 MG/ML IJ SOLN
40.0000 mg | Freq: Once | INTRAMUSCULAR | Status: AC
  Administered 2019-11-02: 40 mg via INTRAVENOUS
  Filled 2019-11-02: qty 4

## 2019-11-02 MED ORDER — POTASSIUM CHLORIDE CRYS ER 20 MEQ PO TBCR
40.0000 meq | EXTENDED_RELEASE_TABLET | Freq: Once | ORAL | Status: AC
  Administered 2019-11-02: 40 meq via ORAL
  Filled 2019-11-02: qty 2

## 2019-11-02 MED ORDER — METOPROLOL TARTRATE 25 MG PO TABS
25.0000 mg | ORAL_TABLET | Freq: Two times a day (BID) | ORAL | Status: DC
  Administered 2019-11-02 – 2019-11-08 (×12): 25 mg via ORAL
  Filled 2019-11-02 (×13): qty 1

## 2019-11-02 MED ORDER — FUROSEMIDE 10 MG/ML IJ SOLN
40.0000 mg | Freq: Two times a day (BID) | INTRAMUSCULAR | Status: DC
  Administered 2019-11-02 – 2019-11-03 (×3): 40 mg via INTRAVENOUS
  Filled 2019-11-02 (×3): qty 4

## 2019-11-02 NOTE — Progress Notes (Signed)
PROGRESS NOTE    Lydia Vasquez  LDJ:[id number] DOB: December 07, 1960 DOA: 10/25/2019 PCP: Patient, No Pcp Per   Chief Complaint  Patient presents with  . Hematuria    Brief Narrative:  59 year old female with history of cervical cancer status post hysterectomy, depression, anxiety, hypertension hyperlipidemia presented to the ER with hematuria, fatigue, nausea and back pain.  Several days of gross hematuria with progressive fatigue and nausea.  Patient also reported bilateral mid back pain. In the emergency room she was found afebrile, tachycardic, blood pressures were stable.  Chest x-ray with right lower lobe atelectasis.  Leukocytosis with white cell count of 22.8.  Lactic acid 3.6.  COVID-19 negative. Urinalysis with grossly abnormal urine.  CT scan with no hydronephrosis or collection.  Admitted and treated as severe sepsis due to UTI. Patient moved from [place] to [place] about 1 and half year ago and has not taken any of her blood pressure medicine or cholesterol medicines.  She had similar episode about 2 months ago did not need hospitalization.  She was admitted with fatigue, nausea, scleral icterus.  She was found to have sepsis 2/2 UTI as well as jaundice with elevated LFT's.  UTI was treated with abx.  GI was consulted for abnormal LFT's which was thought to be 2/2 alcoholic hepatitis and severe steatohepatitis.  She has pending workup with labs including Jerica Creegan liver biopsy.  Hospitalization c/b vtach on 10/12, workup per cardiology.  Hopefully d/c in next 24-48 hrs, currently receiving IV diuresis.  Assessment & Plan:   Principal Problem:   Sepsis due to urinary tract infection ([place]) Active Problems:   Hypokalemia   Sepsis ([place])   UTI (urinary tract infection)  Ventricular Tachycardia: approximately 3 min today.  She felt palpitations, denied CP.  EKG shows sinus tachycardia, similar to priors.   Replace mag/k Follow troponin negative Echo with EF 60-65% (see  report) Metoprolol 12.5 mg BID - increased to 25 mg BID per cards today  Cards recommending outpatient monitor and EP follow up with Dr. Lovena Le, consider cardiac MRI  Direct Hyperbilirubinemia  Jaundice  Hepatic Steatosis: Elevated bili and alk phos, mildly elevated AST - relatively stable today RUQ [place] with cholelithiasis without findings c/w cholecystitis Follow monospot - negative Follow MRCP - notable for hepatomegaly and diffuse hepatic steatosis.  No obstructive cause for jaundice identified.  Gallbladder wall thickening (up to 0.6 cm) with multiple small gallstones.  Traces ascites.   Will consult gastroenterology - awaiting AMA (negative) and ANA (negative).  ASMA (negative), a1at (elevated), ceruloplasm (wnl) -> s/p liver bx 10/11, follow notable for severely active steatohepatitis with cirrhosis with features suggestive of overlapping sepsis.  Concern for possible alcoholic hepatitis. Consider prednisolone [facility] discriminant function 39.8, it drops to 26 when using upper limit of normal for our lab 15 instead of 12), will defer to GI -> planning to f/u in 4-6 weeks per GI.  Bilateral LE edema: suspect related to cirrhosis with volume overload and hypoalbuminemia.  Weights appear inaccurate, but 7 liters positive.  Continue lasix, can add spironolactone when transitioning to PO Strict I/O, daily weights [place] negative for DVT  Severe sepsis secondary to UTI  E. Coli UTI: Present on admission. Received resuscitation fluid, lactic downtrending Urine cx with e. Coli sensitive to ancef, continue anitbiotics (7 days) Follow blood cultures - NGTD Sepsis physiology improved  Hypokalemia: replace and follow   Hypertension: BP stable, continue to monitor  Hyperlipidemia with hepatic steatosis: elevated triglycerides, LDL not calculated.  HDL <10.  Continue to follow.  History of cervical cancer status post total hysterectomy: In 2012.  CT scan with no evidence of recurrent cancer or  obstruction.  Abnormal CXR: repeat 10/7 with persistent bibasilar atelectasis/infiltrates.  Small L pleural effusion, possible.  Continue to monitor.  DVT prophylaxis: lovenox Code Status: full code Family Communication: none at bedside - daughter 10/7 and 10/8 Disposition:   Status is: Inpatient  Remains inpatient appropriate because:Inpatient level of care appropriate due to severity of illness   Dispo: The patient is from: Home              Anticipated d/c is to: Home              Anticipated d/c date is: > 3 days              Patient currently is not medically stable to d/c. Consultants:   none  Procedures:  Echo IMPRESSIONS    1. Left ventricular ejection fraction, by estimation, is 60 to 65%. The  left ventricle has normal function. The left ventricle has no regional  wall motion abnormalities. Left ventricular diastolic parameters were  normal.  2. Right ventricular systolic function is normal. The right ventricular  size is normal.  3. The mitral valve is normal in structure. No evidence of mitral valve  regurgitation. No evidence of mitral stenosis.  4. The aortic valve is normal in structure. Aortic valve regurgitation is  not visualized. No aortic stenosis is present.  5. The inferior vena cava is normal in size with greater than 50%  respiratory variability, suggesting right atrial pressure of 3 mmHg.   LE [place]   Summary:  RIGHT:  - There is no evidence of deep vein thrombosis in the lower extremity.  However, portions of this examination were limited- see technologist  comments above.    - No cystic structure found in the popliteal fossa.    LEFT:  - There is no evidence of deep vein thrombosis in the lower extremity.  However, portions of this examination were limited- see technologist  comments above.    - No cystic structure found in the popliteal fossa.   Antimicrobials: Anti-infectives (From admission, onward)   Start     Dose/Rate  Route Frequency Ordered Stop   10/26/19 1200  ceFAZolin (ANCEF) IVPB 1 g/50 mL premix        1 g 100 mL/hr over 30 Minutes Intravenous Every 8 hours 10/26/19 1044 10/30/19 2103   10/25/19 0330  azithromycin (ZITHROMAX) 500 mg in sodium chloride 0.9 % 250 mL IVPB        500 mg 250 mL/hr over 60 Minutes Intravenous  Once 10/25/19 0321 10/25/19 0617   10/25/19 0145  cefTRIAXone (ROCEPHIN) 1 g in sodium chloride 0.9 % 100 mL IVPB  Status:  Discontinued        1 g 200 mL/hr over 30 Minutes Intravenous Every 24 hours 10/25/19 0132 10/26/19 1024     Subjective: Feels Korrey Schleicher little better today, doesn't feel quite ready for d/c  Objective: Vitals:   11/02/19 0447 11/02/19 0500 11/02/19 1031 11/02/19 1436  BP: (!) 94/54  105/63 104/70  Pulse: 85  74 84  Resp: 18   (!) 22  Temp: 97.9 F (36.6 C)   99.2 F (37.3 C)  TempSrc: Oral   Oral  SpO2: 92%   95%  Weight:  117.4 kg    Height:        Intake/Output Summary (Last 24 hours) at 11/02/2019  Dennis Port filed at 11/02/2019 0908 Gross per 24 hour  Intake 547.68 ml  Output --  Net 547.68 ml   Filed Weights   10/28/19 0500 10/31/19 0500 11/02/19 0500  Weight: 121.4 kg 124.1 kg 117.4 kg    Examination:  General: No acute distress. Scleral icterus Cardiovascular: Heart sounds show Lory Galan regular rate, and rhythm Lungs: unlabored Abdomen: Soft, nontender, nondistended  Neurological: Alert and oriented 3. Moves all extremities 4. Cranial nerves II through XII grossly intact. Skin: Warm and dry. No rashes or lesions. Extremities: bilateral LE edema    Data Reviewed: I have personally reviewed following labs and imaging studies  CBC: Recent Labs  Lab 10/29/19 0525 10/30/19 0539 10/31/19 0531 11/01/19 0500 11/02/19 0607  WBC 15.7* 15.8* 15.7* 16.3* 17.4*  NEUTROABS 12.3* 12.4* 12.4* 13.1* 13.7*  HGB 9.8* 10.1* 10.0* 9.6* 9.7*  HCT 28.5* 29.9* 29.7* 28.4* 29.0*  MCV 102.9* 103.8* 102.1* 101.8* 101.0*  PLT 354 378 354 345 351     Basic Metabolic Panel: Recent Labs  Lab 10/29/19 0525 10/30/19 0539 10/31/19 0531 11/01/19 0500 11/02/19 0607  NA 138 135 135 137 132*  K 3.2* 3.7 3.7 3.7 3.9  CL 100 99 101 102 99  CO2 '26 27 23 24 24  ' GLUCOSE 101* 118* 97 107* 104*  BUN '7 7 7 9 10  ' CREATININE 0.33* 0.42* 0.36* 0.36* 0.47  CALCIUM 8.7* 8.5* 8.5* 8.7* 8.4*  MG 1.9 1.8 1.7 1.8 2.2  PHOS 3.3 3.3 3.6 3.9 3.5    GFR: Estimated Creatinine Clearance: 109.5 mL/min (by C-G formula based on SCr of 0.47 mg/dL).  Liver Function Tests: Recent Labs  Lab 10/29/19 0525 10/30/19 0539 10/31/19 0531 11/01/19 0500 11/02/19 0607  AST 74* 86* 82* 81* 86*  ALT '15 16 17 16 17  ' ALKPHOS 142* 150* 146* 156* 170*  BILITOT 11.4* 12.2* 11.5* 12.0* 12.2*  PROT 6.3* 6.6 6.5 6.4* 6.8  ALBUMIN 1.9* 2.0* 2.0* 1.9* 2.3*    CBG: No results for input(s): GLUCAP in the last 168 hours.   Recent Results (from the past 240 hour(s))  Urine Culture     Status: Abnormal   Collection Time: 10/25/19 12:39 AM   Specimen: Urine, Clean Catch  Result Value Ref Range Status   Specimen Description   Final    URINE, CLEAN CATCH Performed at [facility], [place] [address], [place], [place] [address]    Special Requests   Final    NONE Performed at [facility], [place] [address] [place], [place] [address]    Culture >=100,000 COLONIES/mL ESCHERICHIA COLI (Onix Jumper)  Final   Report Status 10/26/2019 FINAL  Final   Organism ID, Bacteria ESCHERICHIA COLI (Ricarda Atayde)  Final      Susceptibility   Escherichia coli - MIC*    AMPICILLIN >=32 RESISTANT Resistant     CEFAZOLIN <=4 SENSITIVE Sensitive     CEFTRIAXONE <=0.25 SENSITIVE Sensitive     CIPROFLOXACIN >=4 RESISTANT Resistant     GENTAMICIN >=16 RESISTANT Resistant     IMIPENEM <=0.25 SENSITIVE Sensitive     NITROFURANTOIN <=16 SENSITIVE Sensitive     TRIMETH/SULFA <=20 SENSITIVE Sensitive     AMPICILLIN/SULBACTAM 16 INTERMEDIATE Intermediate     PIP/TAZO <=4  SENSITIVE Sensitive     * >=100,000 COLONIES/mL ESCHERICHIA COLI  Culture, blood (single)     Status: None   Collection Time: 10/25/19  1:59 AM   Specimen: BLOOD  Result Value Ref Range Status  Specimen Description   Final    BLOOD RIGHT ANTECUBITAL Performed at [place] [address] [place], [place] [address]    Special Requests   Final    BOTTLES DRAWN AEROBIC AND ANAEROBIC Blood Culture adequate volume Performed at [place] [address] [place], [place] [address]    Culture   Final    NO GROWTH 5 DAYS Performed at [facility], [place] [address] [place], [place] [address]    Report Status 10/30/2019 FINAL  Final  Respiratory Panel by RT PCR (Flu Dorina Ribaudo&B, Covid) - Nasopharyngeal Swab     Status: None   Collection Time: 10/25/19  3:45 AM   Specimen: Nasopharyngeal Swab  Result Value Ref Range Status   SARS Coronavirus 2 by RT PCR NEGATIVE NEGATIVE Final    Comment: (NOTE) SARS-CoV-2 target nucleic acids are NOT DETECTED.  The SARS-CoV-2 RNA is generally detectable in upper respiratoy specimens during the acute phase of infection. The lowest concentration of SARS-CoV-2 viral copies this assay can detect is 131 copies/mL. Deval Mroczka negative result does not preclude SARS-Cov-2 infection and should not be used as the sole basis for treatment or other patient management decisions. Janyia Guion negative result may occur with  improper specimen collection/handling, submission of specimen other than nasopharyngeal swab, presence of viral mutation(s) within the areas targeted by this assay, and inadequate number of viral copies (<131 copies/mL). Arleen Bar negative result must be combined with clinical observations, patient history, and epidemiological information. The expected result is Negative.  Fact Sheet for Patients:  PinkCheek.be  Fact Sheet for Healthcare Providers:  GravelBags.it  This  test is no t yet approved or cleared by the [place] FDA and  has been authorized for detection and/or diagnosis of SARS-CoV-2 by FDA under an Emergency Use Authorization (EUA). This EUA will remain  in effect (meaning this test can be used) for the duration of the COVID-19 declaration under Section 564(b)(1) of the Act, 21 U.S.C. section 360bbb-3(b)(1), unless the authorization is terminated or revoked sooner.     Influenza Millicent Blazejewski by PCR NEGATIVE NEGATIVE Final   Influenza B by PCR NEGATIVE NEGATIVE Final    Comment: (NOTE) The Xpert Xpress SARS-CoV-2/FLU/RSV assay is intended as an aid in  the diagnosis of influenza from Nasopharyngeal swab specimens and  should not be used as Akiko Schexnider sole basis for treatment. Nasal washings and  aspirates are unacceptable for Xpert Xpress SARS-CoV-2/FLU/RSV  testing.  Fact Sheet for Patients: PinkCheek.be  Fact Sheet for Healthcare Providers: GravelBags.it  This test is not yet approved or cleared by the [place] FDA and  has been authorized for detection and/or diagnosis of SARS-CoV-2 by  FDA under an Emergency Use Authorization (EUA). This EUA will remain  in effect (meaning this test can be used) for the duration of the  Covid-19 declaration under Section 564(b)(1) of the Act, 21  U.S.C. section 360bbb-3(b)(1), unless the authorization is  terminated or revoked. Performed at [facility], [place] [address] [place], [place] [address]          Radiology Studies: DG CHEST PORT 1 VIEW  Result Date: 11/02/2019 CLINICAL DATA:  Short of breath and hematuria EXAM: PORTABLE CHEST 1 VIEW COMPARISON:  10/27/2019 FINDINGS: Elevated right hemidiaphragm with right lower lobe atelectasis again noted. Mild left lower lobe atelectasis improved. Negative for heart failure or effusion. IMPRESSION: Bibasilar atelectasis with mild improvement on the left. No new finding.  Electronically Signed   By: Juanda Crumble  Carlis Abbott M.D.   On: 11/02/2019 14:09   ECHOCARDIOGRAM COMPLETE  Result Date: 11/01/2019    ECHOCARDIOGRAM REPORT   Patient Name:   BIJOU EASLER Date of Exam: 11/01/2019 Medical Rec #:  [id number]     Height:       71.0 in Accession #:    [id number]    Weight:       273.6 lb Date of Birth:  1960/08/30     BSA:          2.409 m Patient Age:    81 years      BP:           122/75 mmHg Patient Gender: F             HR:           101 bpm. Exam Location:  Inpatient Procedure: 2D Echo Indications:    Ventricular Tachycardia I47.2  History:        Patient has no prior history of Echocardiogram examinations.                 Risk Factors:Hypertension and Dyslipidemia. Sepsis due to                 urinary tract infection, hepatic steatosis, past history of                 cancer.  Sonographer:    Darlina Sicilian RDCS Referring Phys: [phone] Elya Tarquinio CALDWELL POWELL [place]  1. Left ventricular ejection fraction, by estimation, is 60 to 65%. The left ventricle has normal function. The left ventricle has no regional wall motion abnormalities. Left ventricular diastolic parameters were normal.  2. Right ventricular systolic function is normal. The right ventricular size is normal.  3. The mitral valve is normal in structure. No evidence of mitral valve regurgitation. No evidence of mitral stenosis.  4. The aortic valve is normal in structure. Aortic valve regurgitation is not visualized. No aortic stenosis is present.  5. The inferior vena cava is normal in size with greater than 50% respiratory variability, suggesting right atrial pressure of 3 mmHg. FINDINGS  Left Ventricle: Left ventricular ejection fraction, by estimation, is 60 to 65%. The left ventricle has normal function. The left ventricle has no regional wall motion abnormalities. The left ventricular internal cavity size was normal in size. There is  no left ventricular hypertrophy. Left ventricular diastolic parameters were normal.  Right Ventricle: The right ventricular size is normal. No increase in right ventricular wall thickness. Right ventricular systolic function is normal. Left Atrium: Left atrial size was normal in size. Right Atrium: Right atrial size was normal in size. Pericardium: There is no evidence of pericardial effusion. Mitral Valve: The mitral valve is normal in structure. No evidence of mitral valve regurgitation. No evidence of mitral valve stenosis. Tricuspid Valve: The tricuspid valve is normal in structure. Tricuspid valve regurgitation is not demonstrated. No evidence of tricuspid stenosis. Aortic Valve: The aortic valve is normal in structure. Aortic valve regurgitation is not visualized. No aortic stenosis is present. Pulmonic Valve: The pulmonic valve was normal in structure. Pulmonic valve regurgitation is not visualized. No evidence of pulmonic stenosis. Aorta: The aortic root is normal in size and structure. Venous: The inferior vena cava is normal in size with greater than 50% respiratory variability, suggesting right atrial pressure of 3 mmHg. IAS/Shunts: No atrial level shunt detected by color flow Doppler.  LEFT VENTRICLE PLAX 2D LVIDd:  4.85 cm  Diastology LVIDs:         3.15 cm  LV e' medial:    11.40 cm/s LV PW:         1.04 cm  LV E/e' medial:  8.7 LV IVS:        1.07 cm  LV e' lateral:   12.90 cm/s LVOT diam:     2.10 cm  LV E/e' lateral: 7.7 LV SV:         79 LV SV Index:   33 LVOT Area:     3.46 cm  RIGHT VENTRICLE RV S prime:     12.60 cm/s TAPSE (M-mode): 1.8 cm LEFT ATRIUM           Index LA diam:      3.50 cm 1.45 cm/m LA Vol (A2C): 26.8 ml 11.13 ml/m LA Vol (A4C): 33.3 ml 13.82 ml/m  AORTIC VALVE LVOT Vmax:   131.00 cm/s LVOT Vmean:  78.500 cm/s LVOT VTI:    0.228 m  AORTA Ao Root diam: 3.40 cm MITRAL VALVE MV Area (PHT): 3.53 cm    SHUNTS MV Decel Time: 215 msec    Systemic VTI:  0.23 m MV E velocity: 99.00 cm/s  Systemic Diam: 2.10 cm MV Tanaysia Bhardwaj velocity: 98.50 cm/s MV E/Lettie Czarnecki ratio:  1.01  Mihai Croitoru MD Electronically signed by Sanda Klein MD Signature Date/Time: 11/01/2019/2:47:25 PM    Final    VAS [place] LOWER EXTREMITY VENOUS (DVT)  Result Date: 11/01/2019  Lower Venous DVT Study Indications: Edema.  Limitations: Body habitus and poor ultrasound/tissue interface. Comparison Study: No prior studies. Performing Technologist: Oliver Hum RVT  Examination Guidelines: Yonatan Guitron complete evaluation includes B-mode imaging, spectral Doppler, color Doppler, and power Doppler as needed of all accessible portions of each vessel. Bilateral testing is considered an integral part of Gillie Fleites complete examination. Limited examinations for reoccurring indications may be performed as noted. The reflux portion of the exam is performed with the patient in reverse Trendelenburg.  +---------+---------------+---------+-----------+----------+--------------+ RIGHT    CompressibilityPhasicitySpontaneityPropertiesThrombus Aging +---------+---------------+---------+-----------+----------+--------------+ CFV      Full           Yes      Yes                                 +---------+---------------+---------+-----------+----------+--------------+ SFJ      Full                                                        +---------+---------------+---------+-----------+----------+--------------+ FV Prox  Full                                                        +---------+---------------+---------+-----------+----------+--------------+ FV Mid   Full                                                        +---------+---------------+---------+-----------+----------+--------------+ FV DistalFull                                                        +---------+---------------+---------+-----------+----------+--------------+  PFV      Full                                                        +---------+---------------+---------+-----------+----------+--------------+ POP      Full            Yes      Yes                                 +---------+---------------+---------+-----------+----------+--------------+ PTV      Full                                                        +---------+---------------+---------+-----------+----------+--------------+ PERO                                                  Not visualized +---------+---------------+---------+-----------+----------+--------------+   +---------+---------------+---------+-----------+----------+--------------+ LEFT     CompressibilityPhasicitySpontaneityPropertiesThrombus Aging +---------+---------------+---------+-----------+----------+--------------+ CFV      Full           Yes      Yes                                 +---------+---------------+---------+-----------+----------+--------------+ SFJ      Full                                                        +---------+---------------+---------+-----------+----------+--------------+ FV Prox  Full                                                        +---------+---------------+---------+-----------+----------+--------------+ FV Mid   Full                                                        +---------+---------------+---------+-----------+----------+--------------+ FV DistalFull                                                        +---------+---------------+---------+-----------+----------+--------------+ PFV      Full                                                        +---------+---------------+---------+-----------+----------+--------------+  POP      Full           Yes      Yes                                 +---------+---------------+---------+-----------+----------+--------------+ PTV      Full                                                        +---------+---------------+---------+-----------+----------+--------------+ PERO                                                  Not  visualized +---------+---------------+---------+-----------+----------+--------------+     Summary: RIGHT: - There is no evidence of deep vein thrombosis in the lower extremity. However, portions of this examination were limited- see technologist comments above.  - No cystic structure found in the popliteal fossa.  LEFT: - There is no evidence of deep vein thrombosis in the lower extremity. However, portions of this examination were limited- see technologist comments above.  - No cystic structure found in the popliteal fossa.  *See table(s) above for measurements and observations. Electronically signed by Deitra Mayo MD on 11/01/2019 at 8:15:22 PM.    Final         Scheduled Meds: . enoxaparin (LOVENOX) injection  60 mg Subcutaneous Q24H  . famotidine  20 mg Oral BID  . furosemide  40 mg Intravenous BID  . metoprolol tartrate  25 mg Oral BID  . sodium chloride flush  3 mL Intravenous Q12H   Continuous Infusions:    LOS: 8 days    Time spent: over 30 min    Fayrene Helper, MD [facility]   To contact the attending provider between 7A-7P or the covering provider during after hours 7P-7A, please log into the web site [email or web address] and access using universal [place] password for that web site. If you do not have the password, please call the hospital operator.  11/02/2019, 4:11 PM

## 2019-11-02 NOTE — Progress Notes (Signed)
Liver biopsy results reviewed: Severely active steatohepatitis (grade 3 of 3) with cirrhosis (stage 4 of 4).  Features suggestive of overlapping sepsis.  Suspect jaundice and hyperbilirubinemia are combination of steatohepatitis, heavy alcohol use, and sepsis from UTI.  Continue supportive care.  Alcohol abstinence.  We will arrange follow-up in our office in 4 to 6 weeks.  Edrick Kins [facility] Contact #  [phone]

## 2019-11-02 NOTE — Progress Notes (Signed)
Progress Note  Patient Name: Lydia Vasquez Date of Encounter: 11/02/2019  Primary Cardiologist: New   Subjective   Less anxious no cardiac complaints ? Biopsy with cirrhosis   Inpatient Medications    Scheduled Meds: . enoxaparin (LOVENOX) injection  60 mg Subcutaneous Q24H  . famotidine  20 mg Oral BID  . furosemide  40 mg Intravenous BID  . furosemide  40 mg Intravenous Once  . metoprolol tartrate  25 mg Oral BID  . potassium chloride  40 mEq Oral Once  . sodium chloride flush  3 mL Intravenous Q12H   Continuous Infusions:  PRN Meds: HYDROcodone-acetaminophen, ondansetron **OR** ondansetron (ZOFRAN) IV, senna-docusate   Vital Signs    Vitals:   11/01/19 1318 11/01/19 2004 11/02/19 0447 11/02/19 0500  BP: 122/75 106/68 (!) 94/54   Pulse: (!) 103 96 85   Resp: 20 20 18    Temp: 98.5 F (36.9 C) 99.7 F (37.6 C) 97.9 F (36.6 C)   TempSrc: Oral Oral Oral   SpO2: 96% 93% 92%   Weight:    117.4 kg  Height:        Intake/Output Summary (Last 24 hours) at 11/02/2019 1000 Last data filed at 11/02/2019 0908 Gross per 24 hour  Intake 667.68 ml  Output --  Net 667.68 ml   Filed Weights   10/28/19 0500 10/31/19 0500 11/02/19 0500  Weight: 121.4 kg 124.1 kg 117.4 kg    Physical Exam   Affect appropriate Chronically ill black female  HEENT: scleral icterus  Neck supple with no adenopathy JVP normal no bruits no thyromegaly Lungs clear with no wheezing and good diaphragmatic motion Heart:  S1/S2 no murmur, no rub, gallop or click PMI normal Abdomen: some pain RUQ over liver biopsy site  Distal pulses intact with no bruits Plus 2 bilateral LE  edema Neuro non-focal Skin warm and dry No muscular weakness   Labs    Chemistry Recent Labs  Lab 10/31/19 0531 11/01/19 0500 11/02/19 0607  NA 135 137 132*  K 3.7 3.7 3.9  CL 101 102 99  CO2 23 24 24   GLUCOSE 97 107* 104*  BUN 7 9 10   CREATININE 0.36* 0.36* 0.47  CALCIUM 8.5* 8.7* 8.4*  PROT  6.5 6.4* 6.8  ALBUMIN 2.0* 1.9* 2.3*  AST 82* 81* 86*  ALT 17 16 17   ALKPHOS 146* 156* 170*  BILITOT 11.5* 12.0* 12.2*  GFRNONAA >60 >60 >60  ANIONGAP 11 11 9      Hematology Recent Labs  Lab 10/31/19 0531 11/01/19 0500 11/02/19 0607  WBC 15.7* 16.3* 17.4*  RBC 2.91* 2.79* 2.87*  HGB 10.0* 9.6* 9.7*  HCT 29.7* 28.4* 29.0*  MCV 102.1* 101.8* 101.0*  MCH 34.4* 34.4* 33.8  MCHC 33.7 33.8 33.4  RDW 19.1* 18.6* 18.4*  PLT 354 345 351    Cardiac EnzymesNo results for input(s): TROPONINI in the last 168 hours. No results for input(s): TROPIPOC in the last 168 hours.   BNP Recent Labs  Lab 11/01/19 0500 11/02/19 0607  BNP 189.4* 325.8*     DDimer No results for input(s): DDIMER in the last 168 hours.   Radiology    12/31/19 BIOPSY (LIVER)  Result Date: 10/31/2019 INDICATION: Elevated LFTs of uncertain etiology. Please perform ultrasound-guided biopsy for tissue diagnostic purposes. EXAM: ULTRASOUND GUIDED LIVER BIOPSY COMPARISON:  Right upper quadrant abdominal ultrasound-10/25/2019; abdominal MRI-10/27/2018 MEDICATIONS: None ANESTHESIA/SEDATION: Fentanyl 100 mcg IV; Versed 3 mg IV Total Moderate Sedation time: 12 minutes; The patient was continuously  monitored during the procedure by the interventional radiology nurse under my direct supervision. COMPLICATIONS: None immediate. PROCEDURE: Informed written consent was obtained from the patient after a discussion of the risks, benefits and alternatives to treatment. The patient understands and consents the procedure. A timeout was performed prior to the initiation of the procedure. Ultrasound scanning was performed of the right upper abdominal quadrant and the procedure was planned. The right upper abdomen was prepped and draped in the usual sterile fashion. The overlying soft tissues were anesthetized with 1% lidocaine with epinephrine. A 17 gauge, 6.8 cm co-axial needle was advanced into a peripheral aspect of the right lobe of the liver  and 3 core biopsies were obtained with an 18 gauge core device under direct ultrasound guidance. The co-axial needle track was embolized with the administration of a Gel-Foam slurry. Superficial hemostasis was obtained with manual compression. Post procedural scanning was negative for definitive area of hemorrhage. A dressing was placed. The patient tolerated the procedure well without immediate post procedural complication. IMPRESSION: Technically successful ultrasound guided liver biopsy. Electronically Signed   By: Simonne Come M.D.   On: 10/31/2019 11:53   ECHOCARDIOGRAM COMPLETE  Result Date: 11/01/2019    ECHOCARDIOGRAM REPORT   Patient Name:   Lydia Vasquez Date of Exam: 11/01/2019 Medical Rec #:  [id number]     Height:       71.0 in Accession #:    [id number]    Weight:       273.6 lb Date of Birth:  Dec 04, 1960     BSA:          2.409 m Patient Age:    59 years      BP:           122/75 mmHg Patient Gender: F             HR:           101 bpm. Exam Location:  Inpatient Procedure: 2D Echo Indications:    Ventricular Tachycardia I47.2  History:        Patient has no prior history of Echocardiogram examinations.                 Risk Factors:Hypertension and Dyslipidemia. Sepsis due to                 urinary tract infection, hepatic steatosis, past history of                 cancer.  Sonographer:    Leta Jungling RDCS Referring Phys: [phone] A CALDWELL POWELL JR IMPRESSIONS  1. Left ventricular ejection fraction, by estimation, is 60 to 65%. The left ventricle has normal function. The left ventricle has no regional wall motion abnormalities. Left ventricular diastolic parameters were normal.  2. Right ventricular systolic function is normal. The right ventricular size is normal.  3. The mitral valve is normal in structure. No evidence of mitral valve regurgitation. No evidence of mitral stenosis.  4. The aortic valve is normal in structure. Aortic valve regurgitation is not visualized. No aortic stenosis is  present.  5. The inferior vena cava is normal in size with greater than 50% respiratory variability, suggesting right atrial pressure of 3 mmHg. FINDINGS  Left Ventricle: Left ventricular ejection fraction, by estimation, is 60 to 65%. The left ventricle has normal function. The left ventricle has no regional wall motion abnormalities. The left ventricular internal cavity size was normal in size. There is  no left ventricular hypertrophy. Left  ventricular diastolic parameters were normal. Right Ventricle: The right ventricular size is normal. No increase in right ventricular wall thickness. Right ventricular systolic function is normal. Left Atrium: Left atrial size was normal in size. Right Atrium: Right atrial size was normal in size. Pericardium: There is no evidence of pericardial effusion. Mitral Valve: The mitral valve is normal in structure. No evidence of mitral valve regurgitation. No evidence of mitral valve stenosis. Tricuspid Valve: The tricuspid valve is normal in structure. Tricuspid valve regurgitation is not demonstrated. No evidence of tricuspid stenosis. Aortic Valve: The aortic valve is normal in structure. Aortic valve regurgitation is not visualized. No aortic stenosis is present. Pulmonic Valve: The pulmonic valve was normal in structure. Pulmonic valve regurgitation is not visualized. No evidence of pulmonic stenosis. Aorta: The aortic root is normal in size and structure. Venous: The inferior vena cava is normal in size with greater than 50% respiratory variability, suggesting right atrial pressure of 3 mmHg. IAS/Shunts: No atrial level shunt detected by color flow Doppler.  LEFT VENTRICLE PLAX 2D LVIDd:         4.85 cm  Diastology LVIDs:         3.15 cm  LV e' medial:    11.40 cm/s LV PW:         1.04 cm  LV E/e' medial:  8.7 LV IVS:        1.07 cm  LV e' lateral:   12.90 cm/s LVOT diam:     2.10 cm  LV E/e' lateral: 7.7 LV SV:         79 LV SV Index:   33 LVOT Area:     3.46 cm  RIGHT  VENTRICLE RV S prime:     12.60 cm/s TAPSE (M-mode): 1.8 cm LEFT ATRIUM           Index LA diam:      3.50 cm 1.45 cm/m LA Vol (A2C): 26.8 ml 11.13 ml/m LA Vol (A4C): 33.3 ml 13.82 ml/m  AORTIC VALVE LVOT Vmax:   131.00 cm/s LVOT Vmean:  78.500 cm/s LVOT VTI:    0.228 m  AORTA Ao Root diam: 3.40 cm MITRAL VALVE MV Area (PHT): 3.53 cm    SHUNTS MV Decel Time: 215 msec    Systemic VTI:  0.23 m MV E velocity: 99.00 cm/s  Systemic Diam: 2.10 cm MV A velocity: 98.50 cm/s MV E/A ratio:  1.01 Mihai Croitoru MD Electronically signed by Thurmon Fair MD Signature Date/Time: 11/01/2019/2:47:25 PM    Final    VAS [place] LOWER EXTREMITY VENOUS (DVT)  Result Date: 11/01/2019  Lower Venous DVT Study Indications: Edema.  Limitations: Body habitus and poor ultrasound/tissue interface. Comparison Study: No prior studies. Performing Technologist: Chanda Busing RVT  Examination Guidelines: A complete evaluation includes B-mode imaging, spectral Doppler, color Doppler, and power Doppler as needed of all accessible portions of each vessel. Bilateral testing is considered an integral part of a complete examination. Limited examinations for reoccurring indications may be performed as noted. The reflux portion of the exam is performed with the patient in reverse Trendelenburg.  +---------+---------------+---------+-----------+----------+--------------+ RIGHT    CompressibilityPhasicitySpontaneityPropertiesThrombus Aging +---------+---------------+---------+-----------+----------+--------------+ CFV      Full           Yes      Yes                                 +---------+---------------+---------+-----------+----------+--------------+ SFJ  Full                                                        +---------+---------------+---------+-----------+----------+--------------+ FV Prox  Full                                                         +---------+---------------+---------+-----------+----------+--------------+ FV Mid   Full                                                        +---------+---------------+---------+-----------+----------+--------------+ FV DistalFull                                                        +---------+---------------+---------+-----------+----------+--------------+ PFV      Full                                                        +---------+---------------+---------+-----------+----------+--------------+ POP      Full           Yes      Yes                                 +---------+---------------+---------+-----------+----------+--------------+ PTV      Full                                                        +---------+---------------+---------+-----------+----------+--------------+ PERO                                                  Not visualized +---------+---------------+---------+-----------+----------+--------------+   +---------+---------------+---------+-----------+----------+--------------+ LEFT     CompressibilityPhasicitySpontaneityPropertiesThrombus Aging +---------+---------------+---------+-----------+----------+--------------+ CFV      Full           Yes      Yes                                 +---------+---------------+---------+-----------+----------+--------------+ SFJ      Full                                                        +---------+---------------+---------+-----------+----------+--------------+  FV Prox  Full                                                        +---------+---------------+---------+-----------+----------+--------------+ FV Mid   Full                                                        +---------+---------------+---------+-----------+----------+--------------+ FV DistalFull                                                         +---------+---------------+---------+-----------+----------+--------------+ PFV      Full                                                        +---------+---------------+---------+-----------+----------+--------------+ POP      Full           Yes      Yes                                 +---------+---------------+---------+-----------+----------+--------------+ PTV      Full                                                        +---------+---------------+---------+-----------+----------+--------------+ PERO                                                  Not visualized +---------+---------------+---------+-----------+----------+--------------+     Summary: RIGHT: - There is no evidence of deep vein thrombosis in the lower extremity. However, portions of this examination were limited- see technologist comments above.  - No cystic structure found in the popliteal fossa.  LEFT: - There is no evidence of deep vein thrombosis in the lower extremity. However, portions of this examination were limited- see technologist comments above.  - No cystic structure found in the popliteal fossa.  *See table(s) above for measurements and observations. Electronically signed by Waverly Ferrari MD on 11/01/2019 at 8:15:22 PM.    Final     Telemetry    SR artifact rare PVCls imporoved   ECG    NSR normal ECG   Cardiac Studies   Echo :  Normal RV/LV function no valve disease   Patient Profile     59 y.o. 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 Seen for NSVT  Assessment & Plan    1. NSVT: -in absence of structural heart problem normal baseline ECG and echo. Improved  with beta blocker Rx Increase lopressor To 25 mg bid. Will arrange outpatient monitor and EP f/u with Dr Ladona Ridgel Consider cardiac MRI   2. HTN: -Stable, 106/68>94/54>122/75 -Continue beta blocker and diuretic   3. HLD: -Elevated trigs however in the setting  of hepatic steatosis   4. Edema: -BNP found to be elevated at 325 -Continue Lasix  -Creatinine stable at 0.47 - consider adding Aldactone with cirrhosis   Other hospital issues include: -Hyperbilirubinemia concerning for alcoholic hepatitis  -UTI sepsis    Charlton Haws MD [facility]   For questions or updates, please contact   Please consult [email or web address] for contact info under Cardiology/STEMI.

## 2019-11-02 NOTE — Progress Notes (Signed)
Physical Therapy Treatment Patient Details Name: Lydia Vasquez MRN: [id number] DOB: 05-24-1960 Today's Date: 11/02/2019    History of Present Illness 59 yo female admitted with UTI, Sepsis, hematuria, back p, jaundice. Hx of cervical a s/p hysterectomy, depression, anxiety, ETOH abuse    PT Comments    Pt ambulated in hallway.  No unsteadiness observed, pt only reports weak LEs.  Pt up ad lib in room.  Pt would benefit from ambulating with nursing during remainder of hospital stay.  Pt has met PT goal and will d/c from acute PT.    Follow Up Recommendations  No PT follow up     Equipment Recommendations  None recommended by PT    Recommendations for Other Services       Precautions / Restrictions Precautions Precautions: Fall Restrictions Weight Bearing Restrictions: No    Mobility  Bed Mobility Overal bed mobility: Modified Independent                Transfers Overall transfer level: Modified independent                  Ambulation/Gait Ambulation/Gait assistance: Modified independent (Device/Increase time);Supervision Gait Distance (Feet): 160 Feet Assistive device: None Gait Pattern/deviations: Step-through pattern;Decreased stride length     General Gait Details: Intermittent hallway rail use to steady. Pt state states legs feel weak, HR 106 bpm   Stairs             Wheelchair Mobility    Modified Rankin (Stroke Patients Only)       Balance                                            Cognition Arousal/Alertness: Awake/alert Behavior During Therapy: WFL for tasks assessed/performed Overall Cognitive Status: Within Functional Limits for tasks assessed                                        Exercises      General Comments        Pertinent Vitals/Pain Pain Assessment: Faces Faces Pain Scale: No hurt    Home Living                      Prior Function            PT Goals  (current goals can now be found in the care plan section) Progress towards PT goals: Goals met/education completed, patient discharged from PT    Frequency    Min 3X/week      PT Plan Current plan remains appropriate    Co-evaluation              AM-PAC PT "6 Clicks" Mobility   Outcome Measure  Help needed turning from your back to your side while in a flat bed without using bedrails?: None Help needed moving from lying on your back to sitting on the side of a flat bed without using bedrails?: None Help needed moving to and from a bed to a chair (including a wheelchair)?: None Help needed standing up from a chair using your arms (e.g., wheelchair or bedside chair)?: A Little Help needed to walk in hospital room?: A Little Help needed climbing 3-5 steps with a railing? : A Little 6 Click Score: 21  End of Session Equipment Utilized During Treatment: Gait belt Activity Tolerance: Patient tolerated treatment well Patient left: with call bell/phone within reach;in bed   PT Visit Diagnosis: Difficulty in walking, not elsewhere classified (R26.2)     Time: 6720-9470 PT Time Calculation (min) (ACUTE ONLY): 9 min  Charges:  $Gait Training: 8-22 mins                    Arlyce Dice, DPT Acute Rehabilitation Services Pager: [phone] Office: [phone]  York Ram E 11/02/2019, 12:58 PM

## 2019-11-03 DIAGNOSIS — I472 Ventricular tachycardia: Secondary | ICD-10-CM | POA: Diagnosis not present

## 2019-11-03 DIAGNOSIS — R9389 Abnormal findings on diagnostic imaging of other specified body structures: Secondary | ICD-10-CM | POA: Diagnosis not present

## 2019-11-03 DIAGNOSIS — R945 Abnormal results of liver function studies: Secondary | ICD-10-CM | POA: Diagnosis not present

## 2019-11-03 DIAGNOSIS — R748 Abnormal levels of other serum enzymes: Secondary | ICD-10-CM

## 2019-11-03 DIAGNOSIS — N390 Urinary tract infection, site not specified: Secondary | ICD-10-CM | POA: Diagnosis not present

## 2019-11-03 DIAGNOSIS — A419 Sepsis, unspecified organism: Secondary | ICD-10-CM | POA: Diagnosis not present

## 2019-11-03 DIAGNOSIS — K7030 Alcoholic cirrhosis of liver without ascites: Secondary | ICD-10-CM | POA: Diagnosis not present

## 2019-11-03 DIAGNOSIS — E876 Hypokalemia: Secondary | ICD-10-CM | POA: Diagnosis not present

## 2019-11-03 LAB — CBC WITH DIFFERENTIAL/PLATELET
Abs Immature Granulocytes: 0.14 10*3/uL — ABNORMAL HIGH (ref 0.00–0.07)
Basophils Absolute: 0.1 10*3/uL (ref 0.0–0.1)
Basophils Relative: 1 %
Eosinophils Absolute: 0.1 10*3/uL (ref 0.0–0.5)
Eosinophils Relative: 1 %
HCT: 30.3 % — ABNORMAL LOW (ref 36.0–46.0)
Hemoglobin: 10 g/dL — ABNORMAL LOW (ref 12.0–15.0)
Immature Granulocytes: 1 %
Lymphocytes Relative: 14 %
Lymphs Abs: 2.2 10*3/uL (ref 0.7–4.0)
MCH: 34 pg (ref 26.0–34.0)
MCHC: 33 g/dL (ref 30.0–36.0)
MCV: 103.1 fL — ABNORMAL HIGH (ref 80.0–100.0)
Monocytes Absolute: 0.9 10*3/uL (ref 0.1–1.0)
Monocytes Relative: 6 %
Neutro Abs: 12.6 10*3/uL — ABNORMAL HIGH (ref 1.7–7.7)
Neutrophils Relative %: 77 %
Platelets: 374 10*3/uL (ref 150–400)
RBC: 2.94 MIL/uL — ABNORMAL LOW (ref 3.87–5.11)
RDW: 18.6 % — ABNORMAL HIGH (ref 11.5–15.5)
WBC: 16.1 10*3/uL — ABNORMAL HIGH (ref 4.0–10.5)
nRBC: 0 % (ref 0.0–0.2)

## 2019-11-03 LAB — COMPREHENSIVE METABOLIC PANEL
ALT: 16 U/L (ref 0–44)
AST: 93 U/L — ABNORMAL HIGH (ref 15–41)
Albumin: 2.1 g/dL — ABNORMAL LOW (ref 3.5–5.0)
Alkaline Phosphatase: 180 U/L — ABNORMAL HIGH (ref 38–126)
Anion gap: 10 (ref 5–15)
BUN: 12 mg/dL (ref 6–20)
CO2: 27 mmol/L (ref 22–32)
Calcium: 8.8 mg/dL — ABNORMAL LOW (ref 8.9–10.3)
Chloride: 98 mmol/L (ref 98–111)
Creatinine, Ser: 0.52 mg/dL (ref 0.44–1.00)
GFR, Estimated: >60 mL/min (ref >60)
Glucose, Bld: 117 mg/dL — ABNORMAL HIGH (ref 70–99)
Potassium: 4.1 mmol/L (ref 3.5–5.1)
Sodium: 135 mmol/L (ref 135–145)
Total Bilirubin: 12.9 mg/dL — ABNORMAL HIGH (ref 0.3–1.2)
Total Protein: 7 g/dL (ref 6.5–8.1)

## 2019-11-03 LAB — MAGNESIUM: Magnesium: 1.9 mg/dL (ref 1.7–2.4)

## 2019-11-03 LAB — PHOSPHORUS: Phosphorus: 4.2 mg/dL (ref 2.5–4.6)

## 2019-11-03 LAB — PROTIME-INR
INR: 1.5 — ABNORMAL HIGH (ref 0.8–1.2)
Prothrombin Time: 17.5 seconds — ABNORMAL HIGH (ref 11.4–15.2)

## 2019-11-03 MED ORDER — ALBUMIN HUMAN 25 % IV SOLN
25.0000 g | Freq: Once | INTRAVENOUS | Status: AC
  Administered 2019-11-03: 25 g via INTRAVENOUS
  Filled 2019-11-03: qty 100

## 2019-11-03 MED ORDER — FUROSEMIDE 10 MG/ML IJ SOLN
60.0000 mg | Freq: Two times a day (BID) | INTRAMUSCULAR | Status: DC
  Administered 2019-11-03 – 2019-11-04 (×2): 60 mg via INTRAVENOUS
  Filled 2019-11-03: qty 6

## 2019-11-03 NOTE — Progress Notes (Signed)
Progress Note  Patient Name: Rawan Riendeau Date of Encounter: 11/03/2019  Primary Cardiologist: New   Subjective   No cardiac complaints   Inpatient Medications    Scheduled Meds: . enoxaparin (LOVENOX) injection  60 mg Subcutaneous Q24H  . famotidine  20 mg Oral BID  . furosemide  40 mg Intravenous BID  . metoprolol tartrate  25 mg Oral BID  . sodium chloride flush  3 mL Intravenous Q12H   Continuous Infusions:  PRN Meds: HYDROcodone-acetaminophen, ondansetron **OR** ondansetron (ZOFRAN) IV, senna-docusate   Vital Signs    Vitals:   11/02/19 1031 11/02/19 1436 11/02/19 2121 11/03/19 0459  BP: 105/63 104/70 117/69 105/71  Pulse: 74 84 88 88  Resp:  (!) 22 20 16   Temp:  99.2 F (37.3 C) 98.6 F (37 C) 97.9 F (36.6 C)  TempSrc:  Oral Oral Oral  SpO2:  95% 97% 96%  Weight:    116.6 kg  Height:        Intake/Output Summary (Last 24 hours) at 11/03/2019 0946 Last data filed at 11/02/2019 2200 Gross per 24 hour  Intake 100 ml  Output --  Net 100 ml   Filed Weights   10/31/19 0500 11/02/19 0500 11/03/19 0459  Weight: 124.1 kg 117.4 kg 116.6 kg    Physical Exam   Affect appropriate Chronically ill black female  HEENT: scleral icterus  Neck supple with no adenopathy JVP normal no bruits no thyromegaly Lungs clear with no wheezing and good diaphragmatic motion Heart:  S1/S2 no murmur, no rub, gallop or click PMI normal Abdomen: some pain RUQ over liver biopsy site  Distal pulses intact with no bruits Plus 2 bilateral LE  edema Neuro non-focal Skin warm and dry No muscular weakness   Labs    Chemistry Recent Labs  Lab 11/01/19 0500 11/02/19 0607 11/03/19 0442  NA 137 132* 135  K 3.7 3.9 4.1  CL 102 99 98  CO2 24 24 27   GLUCOSE 107* 104* 117*  BUN 9 10 12   CREATININE 0.36* 0.47 0.52  CALCIUM 8.7* 8.4* 8.8*  PROT 6.4* 6.8 7.0  ALBUMIN 1.9* 2.3* 2.1*  AST 81* 86* 93*  ALT 16 17 16   ALKPHOS 156* 170* 180*  BILITOT 12.0* 12.2*  12.9*  GFRNONAA >60 >60 >60  ANIONGAP 11 9 10      Hematology Recent Labs  Lab 11/01/19 0500 11/02/19 0607 11/03/19 0442  WBC 16.3* 17.4* 16.1*  RBC 2.79* 2.87* 2.94*  HGB 9.6* 9.7* 10.0*  HCT 28.4* 29.0* 30.3*  MCV 101.8* 101.0* 103.1*  MCH 34.4* 33.8 34.0  MCHC 33.8 33.4 33.0  RDW 18.6* 18.4* 18.6*  PLT 345 351 374    Cardiac EnzymesNo results for input(s): TROPONINI in the last 168 hours. No results for input(s): TROPIPOC in the last 168 hours.   BNP Recent Labs  Lab 11/01/19 0500 11/02/19 0607  BNP 189.4* 325.8*     DDimer No results for input(s): DDIMER in the last 168 hours.   Radiology    DG CHEST PORT 1 VIEW  Result Date: 11/02/2019 CLINICAL DATA:  Short of breath and hematuria EXAM: PORTABLE CHEST 1 VIEW COMPARISON:  10/27/2019 FINDINGS: Elevated right hemidiaphragm with right lower lobe atelectasis again noted. Mild left lower lobe atelectasis improved. Negative for heart failure or effusion. IMPRESSION: Bibasilar atelectasis with mild improvement on the left. No new finding. Electronically Signed   By: 11/05/19 M.D.   On: 11/02/2019 14:09   ECHOCARDIOGRAM COMPLETE  Result  Date: 11/01/2019    ECHOCARDIOGRAM REPORT   Patient Name:   BREYANNA VALERA Date of Exam: 11/01/2019 Medical Rec #:  [id number]     Height:       71.0 in Accession #:    [id number]    Weight:       273.6 lb Date of Birth:  February 05, 1960     BSA:          2.409 m Patient Age:    59 years      BP:           122/75 mmHg Patient Gender: F             HR:           101 bpm. Exam Location:  Inpatient Procedure: 2D Echo Indications:    Ventricular Tachycardia I47.2  History:        Patient has no prior history of Echocardiogram examinations.                 Risk Factors:Hypertension and Dyslipidemia. Sepsis due to                 urinary tract infection, hepatic steatosis, past history of                 cancer.  Sonographer:    Leta Jungling RDCS Referring Phys: [phone] A CALDWELL POWELL JR  IMPRESSIONS  1. Left ventricular ejection fraction, by estimation, is 60 to 65%. The left ventricle has normal function. The left ventricle has no regional wall motion abnormalities. Left ventricular diastolic parameters were normal.  2. Right ventricular systolic function is normal. The right ventricular size is normal.  3. The mitral valve is normal in structure. No evidence of mitral valve regurgitation. No evidence of mitral stenosis.  4. The aortic valve is normal in structure. Aortic valve regurgitation is not visualized. No aortic stenosis is present.  5. The inferior vena cava is normal in size with greater than 50% respiratory variability, suggesting right atrial pressure of 3 mmHg. FINDINGS  Left Ventricle: Left ventricular ejection fraction, by estimation, is 60 to 65%. The left ventricle has normal function. The left ventricle has no regional wall motion abnormalities. The left ventricular internal cavity size was normal in size. There is  no left ventricular hypertrophy. Left ventricular diastolic parameters were normal. Right Ventricle: The right ventricular size is normal. No increase in right ventricular wall thickness. Right ventricular systolic function is normal. Left Atrium: Left atrial size was normal in size. Right Atrium: Right atrial size was normal in size. Pericardium: There is no evidence of pericardial effusion. Mitral Valve: The mitral valve is normal in structure. No evidence of mitral valve regurgitation. No evidence of mitral valve stenosis. Tricuspid Valve: The tricuspid valve is normal in structure. Tricuspid valve regurgitation is not demonstrated. No evidence of tricuspid stenosis. Aortic Valve: The aortic valve is normal in structure. Aortic valve regurgitation is not visualized. No aortic stenosis is present. Pulmonic Valve: The pulmonic valve was normal in structure. Pulmonic valve regurgitation is not visualized. No evidence of pulmonic stenosis. Aorta: The aortic root is  normal in size and structure. Venous: The inferior vena cava is normal in size with greater than 50% respiratory variability, suggesting right atrial pressure of 3 mmHg. IAS/Shunts: No atrial level shunt detected by color flow Doppler.  LEFT VENTRICLE PLAX 2D LVIDd:         4.85 cm  Diastology LVIDs:  3.15 cm  LV e' medial:    11.40 cm/s LV PW:         1.04 cm  LV E/e' medial:  8.7 LV IVS:        1.07 cm  LV e' lateral:   12.90 cm/s LVOT diam:     2.10 cm  LV E/e' lateral: 7.7 LV SV:         79 LV SV Index:   33 LVOT Area:     3.46 cm  RIGHT VENTRICLE RV S prime:     12.60 cm/s TAPSE (M-mode): 1.8 cm LEFT ATRIUM           Index LA diam:      3.50 cm 1.45 cm/m LA Vol (A2C): 26.8 ml 11.13 ml/m LA Vol (A4C): 33.3 ml 13.82 ml/m  AORTIC VALVE LVOT Vmax:   131.00 cm/s LVOT Vmean:  78.500 cm/s LVOT VTI:    0.228 m  AORTA Ao Root diam: 3.40 cm MITRAL VALVE MV Area (PHT): 3.53 cm    SHUNTS MV Decel Time: 215 msec    Systemic VTI:  0.23 m MV E velocity: 99.00 cm/s  Systemic Diam: 2.10 cm MV A velocity: 98.50 cm/s MV E/A ratio:  1.01 Mihai Croitoru MD Electronically signed by Thurmon Fair MD Signature Date/Time: 11/01/2019/2:47:25 PM    Final    VAS [place] LOWER EXTREMITY VENOUS (DVT)  Result Date: 11/01/2019  Lower Venous DVT Study Indications: Edema.  Limitations: Body habitus and poor ultrasound/tissue interface. Comparison Study: No prior studies. Performing Technologist: Chanda Busing RVT  Examination Guidelines: A complete evaluation includes B-mode imaging, spectral Doppler, color Doppler, and power Doppler as needed of all accessible portions of each vessel. Bilateral testing is considered an integral part of a complete examination. Limited examinations for reoccurring indications may be performed as noted. The reflux portion of the exam is performed with the patient in reverse Trendelenburg.  +---------+---------------+---------+-----------+----------+--------------+ RIGHT     CompressibilityPhasicitySpontaneityPropertiesThrombus Aging +---------+---------------+---------+-----------+----------+--------------+ CFV      Full           Yes      Yes                                 +---------+---------------+---------+-----------+----------+--------------+ SFJ      Full                                                        +---------+---------------+---------+-----------+----------+--------------+ FV Prox  Full                                                        +---------+---------------+---------+-----------+----------+--------------+ FV Mid   Full                                                        +---------+---------------+---------+-----------+----------+--------------+ FV DistalFull                                                        +---------+---------------+---------+-----------+----------+--------------+  PFV      Full                                                        +---------+---------------+---------+-----------+----------+--------------+ POP      Full           Yes      Yes                                 +---------+---------------+---------+-----------+----------+--------------+ PTV      Full                                                        +---------+---------------+---------+-----------+----------+--------------+ PERO                                                  Not visualized +---------+---------------+---------+-----------+----------+--------------+   +---------+---------------+---------+-----------+----------+--------------+ LEFT     CompressibilityPhasicitySpontaneityPropertiesThrombus Aging +---------+---------------+---------+-----------+----------+--------------+ CFV      Full           Yes      Yes                                 +---------+---------------+---------+-----------+----------+--------------+ SFJ      Full                                                         +---------+---------------+---------+-----------+----------+--------------+ FV Prox  Full                                                        +---------+---------------+---------+-----------+----------+--------------+ FV Mid   Full                                                        +---------+---------------+---------+-----------+----------+--------------+ FV DistalFull                                                        +---------+---------------+---------+-----------+----------+--------------+ PFV      Full                                                        +---------+---------------+---------+-----------+----------+--------------+  POP      Full           Yes      Yes                                 +---------+---------------+---------+-----------+----------+--------------+ PTV      Full                                                        +---------+---------------+---------+-----------+----------+--------------+ PERO                                                  Not visualized +---------+---------------+---------+-----------+----------+--------------+     Summary: RIGHT: - There is no evidence of deep vein thrombosis in the lower extremity. However, portions of this examination were limited- see technologist comments above.  - No cystic structure found in the popliteal fossa.  LEFT: - There is no evidence of deep vein thrombosis in the lower extremity. However, portions of this examination were limited- see technologist comments above.  - No cystic structure found in the popliteal fossa.  *See table(s) above for measurements and observations. Electronically signed by Waverly Ferrarihristopher Dickson MD on 11/01/2019 at 8:15:22 PM.    Final     Telemetry    SR artifact rare PVCls imporoved   ECG    NSR normal ECG   Cardiac Studies   Echo :  Normal RV/LV function no valve disease   Patient Profile     59 y.o. female admitted  with sepsis , anemia, jaundice likely cirrhosis from ETOH Post liver biiopsy. Seen for NSVT  Assessment & Plan    1. NSVT: - resolved on beta blocker no structural heart dx on echo normal baseline ECG labs ok will arrange Outpatient monitor and f/u EP/Taylor   2. HTN: -Stable, 106/68>94/54>122/75 -Continue beta blocker and diuretic   3. HLD: -Elevated trigs however in the setting of hepatic steatosis   4. Edema: -BNP found to be elevated at 325 -Continue Lasix  -Creatinine stable at 0.47 - consider adding Aldactone with cirrhosis   5. Jaundice:  Likely alcoholic cirrhosis discussed d/c Vodka use no evidence of obstructive GB dx and hepatitis Labs negative   Cardiology will sign off Will arrange f/u with EP/Taylor and outpatient monitor   Charlton HawsPeter Elizer Bostic MD [facility] PortlandFACC   For questions or updates, please contact   Please consult [email or web address] for contact info under Cardiology/STEMI.

## 2019-11-03 NOTE — Progress Notes (Signed)
PROGRESS NOTE    Rini Sleep  MRN:[id number] DOB: 08/12/1960 DOA: 10/25/2019 PCP: Patient, No Pcp Per     Brief Narrative:  59-year-old BF PMHx Depression, Anxiety, cervical cancer s/p Hysterectomy, HTN, HLD,  presented to the ER with hematuria, fatigue, nausea and back pain. Several days of gross hematuria with progressive fatigue and nausea. Patient also reported bilateral mid back pain. In the emergency room she was found afebrile, tachycardic, blood pressures were stable. Chest x-ray with right lower lobe atelectasis. Leukocytosis with white cell count of 22.8. Lactic acid 3.6. COVID-19 negative. Urinalysis with grossly abnormal urine. CT scan with no hydronephrosis or collection. Admitted and treated as severe sepsis due to UTI. Patient moved from [place] to [place] about 1 and half year ago and has not taken any of her blood pressure medicine or cholesterol medicines. She had similar episode about 2 months ago did not need hospitalization.  She was admitted with fatigue, nausea, scleral icterus.  She was found to have sepsis 2/2 UTI as well as jaundice with elevated LFT's.  UTI was treated with abx.  GI was consulted for abnormal LFT's which was thought to be 2/2 alcoholic hepatitis and severe steatohepatitis.  She has pending workup with labs including a liver biopsy.  Hospitalization c/b vtach on 10/12, workup per cardiology.  Hopefully d/c in next 24-48 hrs, currently receiving IV diuresis   Subjective: Afebrile overnight A/O x4, patient tearful stating she is not going to drink any longer.  Positive DOE   Assessment & Plan: Covid vaccination; no vaccination   Principal Problem:   Sepsis due to urinary tract infection (HCC) Active Problems:   Hypokalemia   Sepsis (HCC)   UTI (urinary tract infection)   Ventricular Tachycardia:  -Echo with EF 60-65% (see report) -Furosemide 60 mg BID -10/13 metoprolol 25 mg BID -Cardiology recommends EP follow up with  Dr. Taylor, consider cardiac MRI -10/14 cardiology signed off -Strict in and out -Daily weight  Essential HTN -See ventricular tachycardia  Direct Hyperbilirubinemia  Jaundice  Hepatic Steatosis: Elevated bili and alk phos, mildly elevated AST - relatively stable today RUQ US with cholelithiasis without findings c/w cholecystitis Follow monospot - negative Follow MRCP - notable for hepatomegaly and diffuse hepatic steatosis.  No obstructive cause for jaundice identified.  Gallbladder wall thickening (up to 0.6 cm) with multiple small gallstones.  Traces ascites.   Will consult gastroenterology - awaiting AMA (negative) and ANA (negative).  ASMA (negative), a1at (elevated), ceruloplasm (wnl) -> s/p liver bx 10/11, follow notable for severely active steatohepatitis with cirrhosis with features suggestive of overlapping sepsis.  Concern for possible alcoholic hepatitis. Consider prednisolone (maddrey discriminant function 39.8, it drops to 26 when using upper limit of normal for our lab 15 instead of 12), will defer to GI -> planning to f/u in 4-6 weeks per GI.  Bilateral LE edema: +++++ -Hypotension with fluid overload. -10/14 albumin 25 g + Lasix IV 60 mg BID -Most likely related to cirrhosis with volume overload and height follow-up anemia -Add spironolactone when transitioning to p.o. -Strict in and out +7.6 L -Daily weight -US negative for DVT  Severe sepsis secondary to UTI  E. Coli UTI:  Present on admission. Received resuscitation fluid, lactic downtrending Urine cx with e. Coli sensitive to ancef, continue anitbiotics (7 days) Follow blood cultures - NGTD Sepsis physiology improved  Hypokalemia -Potassium goal > 4   Hyperlipidemia with hepatic steatosis: -Elevated triglycerides, LDL not calculated.  HDL <10.  Continue to follow.    History of cervical cancer status post total hysterectomy: -In 2012. CT scan with no evidence of recurrent cancer or  obstruction.  Abnormal CXR: -Repeat 10/7 with persistent bibasilar atelectasis/infiltrates.  Small L pleural effusion, possible.  Continue to monitor.   DVT prophylaxis: Lovenox Code Status: Full Family Communication:  Status is: Inpatient    Dispo: The patient is from: Home              Anticipated d/c is to: Home              Anticipated d/c date is: 10/21              Patient currently unstable      Consultants:  Cardiology   Procedures/Significant Events:  Echo IMPRESSIONS    1. Left ventricular ejection fraction, by estimation, is 60 to 65%. The  left ventricle has normal function. The left ventricle has no regional  wall motion abnormalities. Left ventricular diastolic parameters were  normal.  2. Right ventricular systolic function is normal. The right ventricular  size is normal.  3. The mitral valve is normal in structure. No evidence of mitral valve  regurgitation. No evidence of mitral stenosis.  4. The aortic valve is normal in structure. Aortic valve regurgitation is  not visualized. No aortic stenosis is present.  5. The inferior vena cava is normal in size with greater than 50%  respiratory variability, suggesting right atrial pressure of 3 mmHg.   LE US   Summary:  RIGHT:  - There is no evidence of deep vein thrombosis in the lower extremity.  However, portions of this examination were limited- see technologist  comments above.    - No cystic structure found in the popliteal fossa.    LEFT:  - There is no evidence of deep vein thrombosis in the lower extremity.  However, portions of this examination were limited- see technologist  comments above.    - No cystic structure found in the popliteal fossa.     I have personally reviewed and interpreted all radiology studies and my findings are as above.  VENTILATOR SETTINGS:    Cultures   Antimicrobials: Anti-infectives (From admission, onward)   Start     Ordered Stop    10/26/19 1200  ceFAZolin (ANCEF) IVPB 1 g/50 mL premix        10/26/19 1044 10/30/19 2103   10/25/19 0330  azithromycin (ZITHROMAX) 500 mg in sodium chloride 0.9 % 250 mL IVPB        10/25/19 0321 10/25/19 0617   10/25/19 0145  cefTRIAXone (ROCEPHIN) 1 g in sodium chloride 0.9 % 100 mL IVPB  Status:  Discontinued        10/25/19 0132 10/26/19 1024       Devices    LINES / TUBES:      Continuous Infusions:   Objective: Vitals:   11/02/19 1031 11/02/19 1436 11/02/19 2121 11/03/19 0459  BP: 105/63 104/70 117/69 105/71  Pulse: 74 84 88 88  Resp:  (!) 22 20 16  Temp:  99.2 F (37.3 C) 98.6 F (37 C) 97.9 F (36.6 C)  TempSrc:  Oral Oral Oral  SpO2:  95% 97% 96%  Weight:    116.6 kg  Height:        Intake/Output Summary (Last 24 hours) at 11/03/2019 0911 Last data filed at 11/02/2019 2200 Gross per 24 hour  Intake 100 ml  Output --  Net 100 ml   Filed Weights   10/31/19 0500 11/02/19   0500 11/03/19 0459  Weight: 124.1 kg 117.4 kg 116.6 kg    Examination:  General: A/O x4, No acute respiratory distress Eyes: negative scleral hemorrhage, negative anisocoria, negative icterus ENT: Negative Runny nose, negative gingival bleeding, Neck:  Negative scars, masses, torticollis, lymphadenopathy, JVD Lungs: Clear to auscultation bilaterally without wheezes or crackles Cardiovascular: Regular rate and rhythm without murmur gallop or rub normal S1 and S2 Abdomen: OBESE, negative abdominal pain, nondistended, positive soft, bowel sounds, no rebound, no ascites, no appreciable mass Extremities: No significant cyanosis, clubbing, +2-3 pedal edema bilateral lower extremity to mid thigh Skin: Negative rashes, lesions, ulcers Psychiatric:  Negative depression, negative anxiety, negative fatigue, negative mania  Central nervous system:  Cranial nerves II through XII intact, tongue/uvula midline, all extremities muscle strength 5/5, sensation intact throughout, negative dysarthria,  negative expressive aphasia, negative receptive aphasia.  .     Data Reviewed: Care during the described time interval was provided by me .  I have reviewed this patient's available data, including medical history, events of note, physical examination, and all test results as part of my evaluation.  CBC: Recent Labs  Lab 10/30/19 0539 10/31/19 0531 11/01/19 0500 11/02/19 0607 11/03/19 0442  WBC 15.8* 15.7* 16.3* 17.4* 16.1*  NEUTROABS 12.4* 12.4* 13.1* 13.7* 12.6*  HGB 10.1* 10.0* 9.6* 9.7* 10.0*  HCT 29.9* 29.7* 28.4* 29.0* 30.3*  MCV 103.8* 102.1* 101.8* 101.0* 103.1*  PLT 378 354 345 351 374   Basic Metabolic Panel: Recent Labs  Lab 10/30/19 0539 10/31/19 0531 11/01/19 0500 11/02/19 0607 11/03/19 0442  NA 135 135 137 132* 135  K 3.7 3.7 3.7 3.9 4.1  CL 99 101 102 99 98  CO2 27 23 24 24 27  GLUCOSE 118* 97 107* 104* 117*  BUN 7 7 9 10 12  CREATININE 0.42* 0.36* 0.36* 0.47 0.52  CALCIUM 8.5* 8.5* 8.7* 8.4* 8.8*  MG 1.8 1.7 1.8 2.2 1.9  PHOS 3.3 3.6 3.9 3.5 4.2   GFR: Estimated Creatinine Clearance: 109.1 mL/min (by C-G formula based on SCr of 0.52 mg/dL). Liver Function Tests: Recent Labs  Lab 10/30/19 0539 10/31/19 0531 11/01/19 0500 11/02/19 0607 11/03/19 0442  AST 86* 82* 81* 86* 93*  ALT 16 17 16 17 16  ALKPHOS 150* 146* 156* 170* 180*  BILITOT 12.2* 11.5* 12.0* 12.2* 12.9*  PROT 6.6 6.5 6.4* 6.8 7.0  ALBUMIN 2.0* 2.0* 1.9* 2.3* 2.1*   No results for input(s): LIPASE, AMYLASE in the last 168 hours. No results for input(s): AMMONIA in the last 168 hours. Coagulation Profile: Recent Labs  Lab 10/30/19 0539 10/31/19 0531 11/01/19 0500 11/02/19 0607 11/03/19 0442  INR 1.5* 1.5* 1.5* 1.5* 1.5*   Cardiac Enzymes: No results for input(s): CKTOTAL, CKMB, CKMBINDEX, TROPONINI in the last 168 hours. BNP (last 3 results) No results for input(s): PROBNP in the last 8760 hours. HbA1C: No results for input(s): HGBA1C in the last 72 hours. CBG: No  results for input(s): GLUCAP in the last 168 hours. Lipid Profile: No results for input(s): CHOL, HDL, LDLCALC, TRIG, CHOLHDL, LDLDIRECT in the last 72 hours. Thyroid Function Tests: No results for input(s): TSH, T4TOTAL, FREET4, T3FREE, THYROIDAB in the last 72 hours. Anemia Panel: No results for input(s): VITAMINB12, FOLATE, FERRITIN, TIBC, IRON, RETICCTPCT in the last 72 hours. Sepsis Labs: No results for input(s): PROCALCITON, LATICACIDVEN in the last 168 hours.  Recent Results (from the past 240 hour(s))  Urine Culture     Status: Abnormal   Collection Time: 10/25/19 12:39 AM     Specimen: Urine, Clean Catch  Result Value Ref Range Status   Specimen Description   Final    URINE, CLEAN CATCH Performed at [facility], [place] [address], [place], [place] [address]    Special Requests   Final    NONE Performed at [facility], [place] [address] [place], [place] [address]    Culture >=100,000 COLONIES/mL ESCHERICHIA COLI (A)  Final   Report Status 10/26/2019 FINAL  Final   Organism ID, Bacteria ESCHERICHIA COLI (A)  Final      Susceptibility   Escherichia coli - MIC*    AMPICILLIN >=32 RESISTANT Resistant     CEFAZOLIN <=4 SENSITIVE Sensitive     CEFTRIAXONE <=0.25 SENSITIVE Sensitive     CIPROFLOXACIN >=4 RESISTANT Resistant     GENTAMICIN >=16 RESISTANT Resistant     IMIPENEM <=0.25 SENSITIVE Sensitive     NITROFURANTOIN <=16 SENSITIVE Sensitive     TRIMETH/SULFA <=20 SENSITIVE Sensitive     AMPICILLIN/SULBACTAM 16 INTERMEDIATE Intermediate     PIP/TAZO <=4 SENSITIVE Sensitive     * >=100,000 COLONIES/mL ESCHERICHIA COLI  Culture, blood (single)     Status: None   Collection Time: 10/25/19  1:59 AM   Specimen: BLOOD  Result Value Ref Range Status   Specimen Description   Final    BLOOD RIGHT ANTECUBITAL Performed at [place] [address], [place], [place] [address]    Special Requests   Final    BOTTLES DRAWN  AEROBIC AND ANAEROBIC Blood Culture adequate volume Performed at [place] [address], [place], [place] [address]    Culture   Final    NO GROWTH 5 DAYS Performed at [facility], [place] [address] [place], [place] [address]    Report Status 10/30/2019 FINAL  Final  Respiratory Panel by RT PCR (Flu A&B, Covid) - Nasopharyngeal Swab     Status: None   Collection Time: 10/25/19  3:45 AM   Specimen: Nasopharyngeal Swab  Result Value Ref Range Status   SARS Coronavirus 2 by RT PCR NEGATIVE NEGATIVE Final    Comment: (NOTE) SARS-CoV-2 target nucleic acids are NOT DETECTED.  The SARS-CoV-2 RNA is generally detectable in upper respiratoy specimens during the acute phase of infection. The lowest concentration of SARS-CoV-2 viral copies this assay can detect is 131 copies/mL. A negative result does not preclude SARS-Cov-2 infection and should not be used as the sole basis for treatment or other patient management decisions. A negative result may occur with  improper specimen collection/handling, submission of specimen other than nasopharyngeal swab, presence of viral mutation(s) within the areas targeted by this assay, and inadequate number of viral copies (<131 copies/mL). A negative result must be combined with clinical observations, patient history, and epidemiological information. The expected result is Negative.  Fact Sheet for Patients:  PinkCheek.be  Fact Sheet for Healthcare Providers:  GravelBags.it  This test is no t yet approved or cleared by the [place] FDA and  has been authorized for detection and/or diagnosis of SARS-CoV-2 by FDA under an Emergency Use Authorization (EUA). This EUA will remain  in effect (meaning this test can be used) for the duration of the COVID-19 declaration under Section 564(b)(1) of the Act, 21 U.S.C. section 360bbb-3(b)(1), unless the authorization is  terminated or revoked sooner.     Influenza A by PCR NEGATIVE NEGATIVE Final   Influenza B by PCR NEGATIVE NEGATIVE Final    Comment: (NOTE) The Xpert Xpress  SARS-CoV-2/FLU/RSV assay is intended as an aid in  the diagnosis of influenza from Nasopharyngeal swab specimens and  should not be used as a sole basis for treatment. Nasal washings and  aspirates are unacceptable for Xpert Xpress SARS-CoV-2/FLU/RSV  testing.  Fact Sheet for Patients: PinkCheek.be  Fact Sheet for Healthcare Providers: GravelBags.it  This test is not yet approved or cleared by the [place] FDA and  has been authorized for detection and/or diagnosis of SARS-CoV-2 by  FDA under an Emergency Use Authorization (EUA). This EUA will remain  in effect (meaning this test can be used) for the duration of the  Covid-19 declaration under Section 564(b)(1) of the Act, 21  U.S.C. section 360bbb-3(b)(1), unless the authorization is  terminated or revoked. Performed at [facility], [place] [address], [place], [place] [address]          Radiology Studies: DG CHEST PORT 1 VIEW  Result Date: 11/02/2019 CLINICAL DATA:  Short of breath and hematuria EXAM: PORTABLE CHEST 1 VIEW COMPARISON:  10/27/2019 FINDINGS: Elevated right hemidiaphragm with right lower lobe atelectasis again noted. Mild left lower lobe atelectasis improved. Negative for heart failure or effusion. IMPRESSION: Bibasilar atelectasis with mild improvement on the left. No new finding. Electronically Signed   By: Franchot Gallo M.D.   On: 11/02/2019 14:09   ECHOCARDIOGRAM COMPLETE  Result Date: 11/01/2019    ECHOCARDIOGRAM REPORT   Patient Name:   Lydia Vasquez Date of Exam: 11/01/2019 Medical Rec #:  [id number]     Height:       71.0 in Accession #:    [id number]    Weight:       273.6 lb Date of Birth:  Jun 06, 1960     BSA:          2.409 m Patient Age:    45 years      BP:            122/75 mmHg Patient Gender: F             HR:           101 bpm. Exam Location:  Inpatient Procedure: 2D Echo Indications:    Ventricular Tachycardia I47.2  History:        Patient has no prior history of Echocardiogram examinations.                 Risk Factors:Hypertension and Dyslipidemia. Sepsis due to                 urinary tract infection, hepatic steatosis, past history of                 cancer.  Sonographer:    Darlina Sicilian RDCS Referring Phys: [phone] A CALDWELL POWELL Killian  1. Left ventricular ejection fraction, by estimation, is 60 to 65%. The left ventricle has normal function. The left ventricle has no regional wall motion abnormalities. Left ventricular diastolic parameters were normal.  2. Right ventricular systolic function is normal. The right ventricular size is normal.  3. The mitral valve is normal in structure. No evidence of mitral valve regurgitation. No evidence of mitral stenosis.  4. The aortic valve is normal in structure. Aortic valve regurgitation is not visualized. No aortic stenosis is present.  5. The inferior vena cava is normal in size with greater than 50% respiratory variability, suggesting right atrial pressure of 3 mmHg. FINDINGS  Left Ventricle: Left ventricular ejection fraction, by estimation, is 60 to 65%. The left  ventricle has normal function. The left ventricle has no regional wall motion abnormalities. The left ventricular internal cavity size was normal in size. There is  no left ventricular hypertrophy. Left ventricular diastolic parameters were normal. Right Ventricle: The right ventricular size is normal. No increase in right ventricular wall thickness. Right ventricular systolic function is normal. Left Atrium: Left atrial size was normal in size. Right Atrium: Right atrial size was normal in size. Pericardium: There is no evidence of pericardial effusion. Mitral Valve: The mitral valve is normal in structure. No evidence of mitral valve  regurgitation. No evidence of mitral valve stenosis. Tricuspid Valve: The tricuspid valve is normal in structure. Tricuspid valve regurgitation is not demonstrated. No evidence of tricuspid stenosis. Aortic Valve: The aortic valve is normal in structure. Aortic valve regurgitation is not visualized. No aortic stenosis is present. Pulmonic Valve: The pulmonic valve was normal in structure. Pulmonic valve regurgitation is not visualized. No evidence of pulmonic stenosis. Aorta: The aortic root is normal in size and structure. Venous: The inferior vena cava is normal in size with greater than 50% respiratory variability, suggesting right atrial pressure of 3 mmHg. IAS/Shunts: No atrial level shunt detected by color flow Doppler.  LEFT VENTRICLE PLAX 2D LVIDd:         4.85 cm  Diastology LVIDs:         3.15 cm  LV e' medial:    11.40 cm/s LV PW:         1.04 cm  LV E/e' medial:  8.7 LV IVS:        1.07 cm  LV e' lateral:   12.90 cm/s LVOT diam:     2.10 cm  LV E/e' lateral: 7.7 LV SV:         79 LV SV Index:   33 LVOT Area:     3.46 cm  RIGHT VENTRICLE RV S prime:     12.60 cm/s TAPSE (M-mode): 1.8 cm LEFT ATRIUM           Index LA diam:      3.50 cm 1.45 cm/m LA Vol (A2C): 26.8 ml 11.13 ml/m LA Vol (A4C): 33.3 ml 13.82 ml/m  AORTIC VALVE LVOT Vmax:   131.00 cm/s LVOT Vmean:  78.500 cm/s LVOT VTI:    0.228 m  AORTA Ao Root diam: 3.40 cm MITRAL VALVE MV Area (PHT): 3.53 cm    SHUNTS MV Decel Time: 215 msec    Systemic VTI:  0.23 m MV E velocity: 99.00 cm/s  Systemic Diam: 2.10 cm MV A velocity: 98.50 cm/s MV E/A ratio:  1.01 Mihai Croitoru MD Electronically signed by Sanda Klein MD Signature Date/Time: 11/01/2019/2:47:25 PM    Final    VAS [place] LOWER EXTREMITY VENOUS (DVT)  Result Date: 11/01/2019  Lower Venous DVT Study Indications: Edema.  Limitations: Body habitus and poor ultrasound/tissue interface. Comparison Study: No prior studies. Performing Technologist: Oliver Hum RVT  Examination Guidelines: A  complete evaluation includes B-mode imaging, spectral Doppler, color Doppler, and power Doppler as needed of all accessible portions of each vessel. Bilateral testing is considered an integral part of a complete examination. Limited examinations for reoccurring indications may be performed as noted. The reflux portion of the exam is performed with the patient in reverse Trendelenburg.  +---------+---------------+---------+-----------+----------+--------------+ RIGHT    CompressibilityPhasicitySpontaneityPropertiesThrombus Aging +---------+---------------+---------+-----------+----------+--------------+ CFV      Full           Yes      Yes                                 +---------+---------------+---------+-----------+----------+--------------+  SFJ      Full                                                        +---------+---------------+---------+-----------+----------+--------------+ FV Prox  Full                                                        +---------+---------------+---------+-----------+----------+--------------+ FV Mid   Full                                                        +---------+---------------+---------+-----------+----------+--------------+ FV DistalFull                                                        +---------+---------------+---------+-----------+----------+--------------+ PFV      Full                                                        +---------+---------------+---------+-----------+----------+--------------+ POP      Full           Yes      Yes                                 +---------+---------------+---------+-----------+----------+--------------+ PTV      Full                                                        +---------+---------------+---------+-----------+----------+--------------+ PERO                                                  Not visualized  +---------+---------------+---------+-----------+----------+--------------+   +---------+---------------+---------+-----------+----------+--------------+ LEFT     CompressibilityPhasicitySpontaneityPropertiesThrombus Aging +---------+---------------+---------+-----------+----------+--------------+ CFV      Full           Yes      Yes                                 +---------+---------------+---------+-----------+----------+--------------+ SFJ      Full                                                        +---------+---------------+---------+-----------+----------+--------------+   FV Prox  Full                                                        +---------+---------------+---------+-----------+----------+--------------+ FV Mid   Full                                                        +---------+---------------+---------+-----------+----------+--------------+ FV DistalFull                                                        +---------+---------------+---------+-----------+----------+--------------+ PFV      Full                                                        +---------+---------------+---------+-----------+----------+--------------+ POP      Full           Yes      Yes                                 +---------+---------------+---------+-----------+----------+--------------+ PTV      Full                                                        +---------+---------------+---------+-----------+----------+--------------+ PERO                                                  Not visualized +---------+---------------+---------+-----------+----------+--------------+     Summary: RIGHT: - There is no evidence of deep vein thrombosis in the lower extremity. However, portions of this examination were limited- see technologist comments above.  - No cystic structure found in the popliteal fossa.  LEFT: - There is no evidence of deep vein  thrombosis in the lower extremity. However, portions of this examination were limited- see technologist comments above.  - No cystic structure found in the popliteal fossa.  *See table(s) above for measurements and observations. Electronically signed by Christopher Dickson MD on 11/01/2019 at 8:15:22 PM.    Final         Scheduled Meds: . enoxaparin (LOVENOX) injection  60 mg Subcutaneous Q24H  . famotidine  20 mg Oral BID  . furosemide  40 mg Intravenous BID  . metoprolol tartrate  25 mg Oral BID  . sodium chloride flush  3 mL Intravenous Q12H   Continuous Infusions:   LOS: 9 days    Time spent:40 min    ,  J, MD [facility] Pager [phone]  If 7PM-7AM, please contact   night-coverage [email or web address] Password [email or web address] 11/03/2019, 9:11 AM  

## 2019-11-04 DIAGNOSIS — R748 Abnormal levels of other serum enzymes: Secondary | ICD-10-CM | POA: Diagnosis not present

## 2019-11-04 DIAGNOSIS — N390 Urinary tract infection, site not specified: Secondary | ICD-10-CM | POA: Diagnosis not present

## 2019-11-04 DIAGNOSIS — R945 Abnormal results of liver function studies: Secondary | ICD-10-CM | POA: Diagnosis not present

## 2019-11-04 DIAGNOSIS — R9389 Abnormal findings on diagnostic imaging of other specified body structures: Secondary | ICD-10-CM | POA: Diagnosis not present

## 2019-11-04 DIAGNOSIS — K7030 Alcoholic cirrhosis of liver without ascites: Secondary | ICD-10-CM | POA: Diagnosis not present

## 2019-11-04 DIAGNOSIS — E876 Hypokalemia: Secondary | ICD-10-CM | POA: Diagnosis not present

## 2019-11-04 DIAGNOSIS — A419 Sepsis, unspecified organism: Secondary | ICD-10-CM | POA: Diagnosis not present

## 2019-11-04 LAB — CBC WITH DIFFERENTIAL/PLATELET
Abs Immature Granulocytes: 0.16 10*3/uL — ABNORMAL HIGH (ref 0.00–0.07)
Basophils Absolute: 0.1 10*3/uL (ref 0.0–0.1)
Basophils Relative: 1 %
Eosinophils Absolute: 0.1 10*3/uL (ref 0.0–0.5)
Eosinophils Relative: 1 %
HCT: 28.3 % — ABNORMAL LOW (ref 36.0–46.0)
Hemoglobin: 9.9 g/dL — ABNORMAL LOW (ref 12.0–15.0)
Immature Granulocytes: 1 %
Lymphocytes Relative: 16 %
Lymphs Abs: 2.8 10*3/uL (ref 0.7–4.0)
MCH: 34.4 pg — ABNORMAL HIGH (ref 26.0–34.0)
MCHC: 35 g/dL (ref 30.0–36.0)
MCV: 98.3 fL (ref 80.0–100.0)
Monocytes Absolute: 0.9 10*3/uL (ref 0.1–1.0)
Monocytes Relative: 5 %
Neutro Abs: 13.2 10*3/uL — ABNORMAL HIGH (ref 1.7–7.7)
Neutrophils Relative %: 76 %
Platelets: 367 10*3/uL (ref 150–400)
RBC: 2.88 MIL/uL — ABNORMAL LOW (ref 3.87–5.11)
RDW: 17.8 % — ABNORMAL HIGH (ref 11.5–15.5)
WBC: 17.2 10*3/uL — ABNORMAL HIGH (ref 4.0–10.5)
nRBC: 0 % (ref 0.0–0.2)

## 2019-11-04 LAB — COMPREHENSIVE METABOLIC PANEL
ALT: 19 U/L (ref 0–44)
AST: 101 U/L — ABNORMAL HIGH (ref 15–41)
Albumin: 2.4 g/dL — ABNORMAL LOW (ref 3.5–5.0)
Alkaline Phosphatase: 167 U/L — ABNORMAL HIGH (ref 38–126)
Anion gap: 13 (ref 5–15)
BUN: 13 mg/dL (ref 6–20)
CO2: 27 mmol/L (ref 22–32)
Calcium: 8.6 mg/dL — ABNORMAL LOW (ref 8.9–10.3)
Chloride: 96 mmol/L — ABNORMAL LOW (ref 98–111)
Creatinine, Ser: 0.54 mg/dL (ref 0.44–1.00)
GFR, Estimated: >60 mL/min (ref >60)
Glucose, Bld: 98 mg/dL (ref 70–99)
Potassium: 3.3 mmol/L — ABNORMAL LOW (ref 3.5–5.1)
Sodium: 136 mmol/L (ref 135–145)
Total Bilirubin: 13.4 mg/dL — ABNORMAL HIGH (ref 0.3–1.2)
Total Protein: 6.9 g/dL (ref 6.5–8.1)

## 2019-11-04 LAB — MAGNESIUM: Magnesium: 1.8 mg/dL (ref 1.7–2.4)

## 2019-11-04 LAB — PHOSPHORUS: Phosphorus: 4.3 mg/dL (ref 2.5–4.6)

## 2019-11-04 MED ORDER — POTASSIUM CHLORIDE 10 MEQ/100ML IV SOLN
10.0000 meq | INTRAVENOUS | Status: AC
  Administered 2019-11-04 (×3): 10 meq via INTRAVENOUS
  Filled 2019-11-04 (×3): qty 100

## 2019-11-04 MED ORDER — MAGNESIUM SULFATE 4 GM/100ML IV SOLN
4.0000 g | Freq: Once | INTRAVENOUS | Status: AC
  Administered 2019-11-04: 4 g via INTRAVENOUS
  Filled 2019-11-04: qty 100

## 2019-11-04 MED ORDER — FUROSEMIDE 10 MG/ML IJ SOLN
60.0000 mg | Freq: Three times a day (TID) | INTRAMUSCULAR | Status: DC
  Administered 2019-11-04 – 2019-11-06 (×6): 60 mg via INTRAVENOUS
  Filled 2019-11-04 (×6): qty 6

## 2019-11-04 MED ORDER — POTASSIUM CHLORIDE CRYS ER 20 MEQ PO TBCR
50.0000 meq | EXTENDED_RELEASE_TABLET | Freq: Once | ORAL | Status: AC
  Administered 2019-11-04: 50 meq via ORAL
  Filled 2019-11-04: qty 1

## 2019-11-04 MED ORDER — ALBUMIN HUMAN 25 % IV SOLN
50.0000 g | Freq: Once | INTRAVENOUS | Status: AC
  Administered 2019-11-04: 12.5 g via INTRAVENOUS
  Filled 2019-11-04: qty 200

## 2019-11-04 NOTE — Plan of Care (Signed)
?  Problem: Clinical Measurements: ?Goal: Ability to maintain clinical measurements within normal limits will improve ?Outcome: Not Progressing ?  ?

## 2019-11-04 NOTE — Progress Notes (Signed)
PROGRESS NOTE    Lydia Vasquez  YOK:[id number] DOB: 28-Jan-1960 DOA: 10/25/2019 PCP: Patient, No Pcp Per     Brief Narrative:  59 year old BF PMHx Depression, Anxiety, cervical cancer s/p Hysterectomy, HTN, HLD,  presented to the ER with hematuria, fatigue, nausea and back pain. Several days of gross hematuria with progressive fatigue and nausea. Patient also reported bilateral mid back pain. In the emergency room she was found afebrile, tachycardic, blood pressures were stable. Chest x-ray with right lower lobe atelectasis. Leukocytosis with white cell count of 22.8. Lactic acid 3.6. COVID-19 negative. Urinalysis with grossly abnormal urine. CT scan with no hydronephrosis or collection. Admitted and treated as severe sepsis due to UTI. Patient moved from [place] to [place] about 1 and half year ago and has not taken any of her blood pressure medicine or cholesterol medicines. She had similar episode about 2 months ago did not need hospitalization.  She was admitted with fatigue, nausea, scleral icterus.  She was found to have sepsis 2/2 UTI as well as jaundice with elevated LFT's.  UTI was treated with abx.  GI was consulted for abnormal LFT's which was thought to be 2/2 alcoholic hepatitis and severe steatohepatitis.  She has pending workup with labs including a liver biopsy.  Hospitalization c/b vtach on 10/12, workup per cardiology.  Hopefully d/c in next 24-48 hrs, currently receiving IV diuresis   Subjective: 10/15 afebrile overnight,     Assessment & Plan: Covid vaccination; no vaccination   Principal Problem:   Sepsis due to urinary tract infection ([place]) Active Problems:   Hypokalemia   Sepsis ([place])   UTI (urinary tract infection)   Ventricular Tachycardia:  -Echo with EF 60-65% (see report) -10/13 metoprolol 25 mg BID -Cardiology recommends EP follow up with Dr. Lovena Le, consider cardiac MRI -Resolved -10/14 cardiology signed off -Strict in and out  +7.1 L -Daily weight Filed Weights   11/02/19 0500 11/03/19 0459 11/04/19 0500  Weight: 117.4 kg 116.6 kg 115 kg    Essential HTN -See ventricular tachycardia  Direct Hyperbilirubinemia  Jaundice  Hepatic Steatosis: -Elevated bili and alk phos, mildly elevated AST - relatively stable today -RUQ [place] with cholelithiasis without findings c/w cholecystitis -Follow monospot - negative -Follow MRCP - notable for hepatomegaly and diffuse hepatic steatosis.  No obstructive cause for jaundice identified.  Gallbladder wall thickening (up to 0.6 cm) with multiple small gallstones.  Traces ascites.   Will consult gastroenterology  - awaiting AMA (negative) and ANA (negative).  ASMA (negative), a1at (elevated), ceruloplasm (wnl) -> s/p liver bx 10/11, follow notable for severely active steatohepatitis with cirrhosis with features suggestive of overlapping sepsis.  Concern for possible alcoholic hepatitis. Consider prednisolone [facility] discriminant function 39.8, it drops to 26 when using upper limit of normal for our lab 15 instead of 12), will defer to GI -> planning to f/u in 4-6 weeks per GI.  Liver cirrhosis stage IV -Stage IV/IV see results for core biopsy below  Bilateral LE edema:  -Hypotension with fluid overload. -10/14 albumin 25 g + Lasix IV 60 mg BID -10/15 Albumin 50 g; increase Lasix IV 60 mg TID -Most likely related to cirrhosis with volume overload and height follow-up anemia -Add spironolactone when transitioning to p.o. -[place] negative for DVT -See ventricular tachycardia  Severe sepsis positive E. coli UTI -Met guidelines for sepsis on admission HR > 90, RR> 20, sign of infection urinary tract.>  2 - Present on admission. Urine cx with e. Coli sensitive to ancef, continue anitbiotics (  7 days) Follow blood cultures - NGTD Sepsis physiology improved  Hypokalemia -Potassium goal > 4  -10/15 K-Dur 50 mEq+ potassium IV 30 mEq  Hypomagnesmia -Magnesium goal> 2 -10/15  Magnesium IV 3 g   Hyperlipidemia with hepatic steatosis: -Elevated triglycerides, LDL not calculated.  HDL <10.  Continue to follow.  History of cervical cancer status post total hysterectomy: -In 2012. CT scan with no evidence of recurrent cancer or obstruction.  Abnormal CXR: -Repeat 10/7 with persistent bibasilar atelectasis/infiltrates.  Small L pleural effusion, possible.  Continue to monitor.  Goals of care -Per PT no PT follow-up required   DVT prophylaxis: Lovenox Code Status: Full Family Communication:  Status is: Inpatient    Dispo: The patient is from: Home              Anticipated d/c is to: Home              Anticipated d/c date is: 10/21              Patient currently unstable      Consultants:  Cardiology GI    Procedures/Significant Events:  10/6 MRCP:Hepatomegaly and diffuse hepatic steatosis. Slightly narrowed appearance of the common bile duct is probably incidental given the lack of upstream dilatation or abnormal wall thickening. No obstructive cause for jaundice is identified; if clinically warranted, nuclear medicine hepatobiliary scan could be utilized to assess for hepatocellular dysfunction. 2. Gallbladder wall thickening up to 0.6 cm, with multiple small gallstones. Although cholecystitis can produce gallbladder wall thickening, so can hypoalbuminemia (the patient currently has an albumin level of 2.1 grams/deciliter). 3. Trace ascites along the anterior border of the lateral segment left hepatic lobe. 4. 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 5. Mild bibasilar atelectasis. 6.  Aortic Atherosclerosis (ICD10-I70.0).  10/11 liver needle core biopsy;-severely active steatohepatitis (grade 3 of 3) with cirrhosis (stage 4  of 4)  - Features suggestive of overlapping sepsis  10/12 echocardiogram;  1. Left ventricular ejection fraction, by estimation, is 60 to 65%. The  left ventricle has normal function. The left  ventricle has no regional  wall motion abnormalities. Left ventricular diastolic parameters were  normal.  2. Right ventricular systolic function is normal. The right ventricular  size is normal.  3. The mitral valve is normal in structure. No evidence of mitral valve  regurgitation. No evidence of mitral stenosis.  4. The aortic valve is normal in structure. Aortic valve regurgitation is  not visualized. No aortic stenosis is present.  5. The inferior vena cava is normal in size with greater than 50%  respiratory variability, suggesting right atrial pressure of 3 mmHg.   LE [place]   Summary:  RIGHT:  - There is no evidence of deep vein thrombosis in the lower extremity.  However, portions of this examination were limited- see technologist  comments above.    - No cystic structure found in the popliteal fossa.    LEFT:  - There is no evidence of deep vein thrombosis in the lower extremity.  However, portions of this examination were limited- see technologist  comments above.    - No cystic structure found in the popliteal fossa.    I have personally reviewed and interpreted all radiology studies and my findings are as above.  VENTILATOR SETTINGS:    Cultures 10/5 urine positive E. Coli 10/5 blood RIGHT AC negative 10/5 HIV screen negative 10/5 acute hepatitis panel negative 10/5 SARS coronavirus negative 10/5 influenza A/B Negative    Antimicrobials:  Anti-infectives (From admission, onward)   Start     Ordered Stop   10/26/19 1200  ceFAZolin (ANCEF) IVPB 1 g/50 mL premix        10/26/19 1044 10/30/19 2103   10/25/19 0330  azithromycin (ZITHROMAX) 500 mg in sodium chloride 0.9 % 250 mL IVPB        10/25/19 0321 10/25/19 0617   10/25/19 0145  cefTRIAXone (ROCEPHIN) 1 g in sodium chloride 0.9 % 100 mL IVPB  Status:  Discontinued        10/25/19 0132 10/26/19 1024       Devices    LINES / TUBES:      Continuous Infusions:   Objective: Vitals:    11/03/19 2115 11/04/19 0500 11/04/19 0632 11/04/19 1257  BP: 123/75  102/65 90/66  Pulse: 98  93 77  Resp: '16  20 18  ' Temp: 98.9 F (37.2 C)  98.5 F (36.9 C) 98.1 F (36.7 C)  TempSrc: Oral  Oral Oral  SpO2: 95%  96% 95%  Weight:  115 kg    Height:        Intake/Output Summary (Last 24 hours) at 11/04/2019 1629 Last data filed at 11/04/2019 9390 Gross per 24 hour  Intake 228.95 ml  Output 700 ml  Net -471.05 ml   Filed Weights   11/02/19 0500 11/03/19 0459 11/04/19 0500  Weight: 117.4 kg 116.6 kg 115 kg    Examination:  General: A/O x4, No acute respiratory distress Eyes: negative scleral hemorrhage, negative anisocoria, negative icterus ENT: Negative Runny nose, negative gingival bleeding, Neck:  Negative scars, masses, torticollis, lymphadenopathy, JVD Lungs: Clear to auscultation bilaterally without wheezes or crackles Cardiovascular: Regular rate and rhythm without murmur gallop or rub normal S1 and S2 Abdomen: OBESE, negative abdominal pain, nondistended, positive soft, bowel sounds, no rebound, no ascites, no appreciable mass Extremities: No significant cyanosis, clubbing, +2-3 pedal edema bilateral lower extremity to mid thigh Skin: Negative rashes, lesions, ulcers Psychiatric:  Negative depression, negative anxiety, negative fatigue, negative mania  Central nervous system:  Cranial nerves II through XII intact, tongue/uvula midline, all extremities muscle strength 5/5, sensation intact throughout, negative dysarthria, negative expressive aphasia, negative receptive aphasia.  .     Data Reviewed: Care during the described time interval was provided by me .  I have reviewed this patient's available data, including medical history, events of note, physical examination, and all test results as part of my evaluation.  CBC: Recent Labs  Lab 10/31/19 0531 11/01/19 0500 11/02/19 0607 11/03/19 0442 11/04/19 0427  WBC 15.7* 16.3* 17.4* 16.1* 17.2*  NEUTROABS  12.4* 13.1* 13.7* 12.6* 13.2*  HGB 10.0* 9.6* 9.7* 10.0* 9.9*  HCT 29.7* 28.4* 29.0* 30.3* 28.3*  MCV 102.1* 101.8* 101.0* 103.1* 98.3  PLT 354 345 351 374 300   Basic Metabolic Panel: Recent Labs  Lab 10/31/19 0531 11/01/19 0500 11/02/19 0607 11/03/19 0442 11/04/19 0427  NA 135 137 132* 135 136  K 3.7 3.7 3.9 4.1 3.3*  CL 101 102 99 98 96*  CO2 '23 24 24 27 27  ' GLUCOSE 97 107* 104* 117* 98  BUN '7 9 10 12 13  ' CREATININE 0.36* 0.36* 0.47 0.52 0.54  CALCIUM 8.5* 8.7* 8.4* 8.8* 8.6*  MG 1.7 1.8 2.2 1.9 1.8  PHOS 3.6 3.9 3.5 4.2 4.3   GFR: Estimated Creatinine Clearance: 108.4 mL/min (by C-G formula based on SCr of 0.54 mg/dL). Liver Function Tests: Recent Labs  Lab 10/31/19 0531 11/01/19 0500 11/02/19 9233 11/03/19 0442 11/04/19  0427  AST 82* 81* 86* 93* 101*  ALT '17 16 17 16 19  ' ALKPHOS 146* 156* 170* 180* 167*  BILITOT 11.5* 12.0* 12.2* 12.9* 13.4*  PROT 6.5 6.4* 6.8 7.0 6.9  ALBUMIN 2.0* 1.9* 2.3* 2.1* 2.4*   No results for input(s): LIPASE, AMYLASE in the last 168 hours. No results for input(s): AMMONIA in the last 168 hours. Coagulation Profile: Recent Labs  Lab 10/30/19 0539 10/31/19 0531 11/01/19 0500 11/02/19 0607 11/03/19 0442  INR 1.5* 1.5* 1.5* 1.5* 1.5*   Cardiac Enzymes: No results for input(s): CKTOTAL, CKMB, CKMBINDEX, TROPONINI in the last 168 hours. BNP (last 3 results) No results for input(s): PROBNP in the last 8760 hours. HbA1C: No results for input(s): HGBA1C in the last 72 hours. CBG: No results for input(s): GLUCAP in the last 168 hours. Lipid Profile: No results for input(s): CHOL, HDL, LDLCALC, TRIG, CHOLHDL, LDLDIRECT in the last 72 hours. Thyroid Function Tests: No results for input(s): TSH, T4TOTAL, FREET4, T3FREE, THYROIDAB in the last 72 hours. Anemia Panel: No results for input(s): VITAMINB12, FOLATE, FERRITIN, TIBC, IRON, RETICCTPCT in the last 72 hours. Sepsis Labs: No results for input(s): PROCALCITON, LATICACIDVEN in  the last 168 hours.  No results found for this or any previous visit (from the past 240 hour(s)).       Radiology Studies: No results found.      Scheduled Meds: . enoxaparin (LOVENOX) injection  60 mg Subcutaneous Q24H  . famotidine  20 mg Oral BID  . furosemide  60 mg Intravenous BID  . metoprolol tartrate  25 mg Oral BID  . sodium chloride flush  3 mL Intravenous Q12H   Continuous Infusions:   LOS: 10 days    Time spent:40 min    Zira Helinski, Geraldo Docker, MD [facility] Pager [phone]  If 7PM-7AM, please contact night-coverage [email or web address] Password [facility] 11/04/2019, 4:29 PM

## 2019-11-05 DIAGNOSIS — K7030 Alcoholic cirrhosis of liver without ascites: Secondary | ICD-10-CM | POA: Diagnosis not present

## 2019-11-05 DIAGNOSIS — R945 Abnormal results of liver function studies: Secondary | ICD-10-CM | POA: Diagnosis not present

## 2019-11-05 DIAGNOSIS — A419 Sepsis, unspecified organism: Secondary | ICD-10-CM | POA: Diagnosis not present

## 2019-11-05 DIAGNOSIS — R9389 Abnormal findings on diagnostic imaging of other specified body structures: Secondary | ICD-10-CM | POA: Diagnosis not present

## 2019-11-05 DIAGNOSIS — E876 Hypokalemia: Secondary | ICD-10-CM | POA: Diagnosis not present

## 2019-11-05 DIAGNOSIS — N390 Urinary tract infection, site not specified: Secondary | ICD-10-CM | POA: Diagnosis not present

## 2019-11-05 DIAGNOSIS — R748 Abnormal levels of other serum enzymes: Secondary | ICD-10-CM | POA: Diagnosis not present

## 2019-11-05 LAB — COMPREHENSIVE METABOLIC PANEL
ALT: 23 U/L (ref 0–44)
AST: 119 U/L — ABNORMAL HIGH (ref 15–41)
Albumin: 2.6 g/dL — ABNORMAL LOW (ref 3.5–5.0)
Alkaline Phosphatase: 176 U/L — ABNORMAL HIGH (ref 38–126)
Anion gap: 13 (ref 5–15)
BUN: 16 mg/dL (ref 6–20)
CO2: 29 mmol/L (ref 22–32)
Calcium: 8.7 mg/dL — ABNORMAL LOW (ref 8.9–10.3)
Chloride: 95 mmol/L — ABNORMAL LOW (ref 98–111)
Creatinine, Ser: 0.56 mg/dL (ref 0.44–1.00)
GFR, Estimated: >60 mL/min (ref >60)
Glucose, Bld: 103 mg/dL — ABNORMAL HIGH (ref 70–99)
Potassium: 4.1 mmol/L (ref 3.5–5.1)
Sodium: 137 mmol/L (ref 135–145)
Total Bilirubin: 14.3 mg/dL — ABNORMAL HIGH (ref 0.3–1.2)
Total Protein: 7.3 g/dL (ref 6.5–8.1)

## 2019-11-05 LAB — CBC WITH DIFFERENTIAL/PLATELET
Abs Immature Granulocytes: 0.14 10*3/uL — ABNORMAL HIGH (ref 0.00–0.07)
Basophils Absolute: 0.1 10*3/uL (ref 0.0–0.1)
Basophils Relative: 1 %
Eosinophils Absolute: 0.1 10*3/uL (ref 0.0–0.5)
Eosinophils Relative: 1 %
HCT: 29.1 % — ABNORMAL LOW (ref 36.0–46.0)
Hemoglobin: 10.1 g/dL — ABNORMAL LOW (ref 12.0–15.0)
Immature Granulocytes: 1 %
Lymphocytes Relative: 15 %
Lymphs Abs: 2.8 10*3/uL (ref 0.7–4.0)
MCH: 34.8 pg — ABNORMAL HIGH (ref 26.0–34.0)
MCHC: 34.7 g/dL (ref 30.0–36.0)
MCV: 100.3 fL — ABNORMAL HIGH (ref 80.0–100.0)
Monocytes Absolute: 1 10*3/uL (ref 0.1–1.0)
Monocytes Relative: 5 %
Neutro Abs: 14.3 10*3/uL — ABNORMAL HIGH (ref 1.7–7.7)
Neutrophils Relative %: 77 %
Platelets: 398 10*3/uL (ref 150–400)
RBC: 2.9 MIL/uL — ABNORMAL LOW (ref 3.87–5.11)
RDW: 17.7 % — ABNORMAL HIGH (ref 11.5–15.5)
WBC: 18.4 10*3/uL — ABNORMAL HIGH (ref 4.0–10.5)
nRBC: 0 % (ref 0.0–0.2)

## 2019-11-05 LAB — MAGNESIUM: Magnesium: 2.6 mg/dL — ABNORMAL HIGH (ref 1.7–2.4)

## 2019-11-05 LAB — PHOSPHORUS: Phosphorus: 4.4 mg/dL (ref 2.5–4.6)

## 2019-11-05 LAB — AMMONIA: Ammonia: 32 umol/L (ref 9–35)

## 2019-11-05 NOTE — Progress Notes (Signed)
PROGRESS NOTE    Lydia Vasquez  JGO:[id number] DOB: 1960-02-26 DOA: 10/25/2019 PCP: Patient, No Pcp Per     Brief Narrative:  59 year old BF PMHx Depression, Anxiety, cervical cancer s/p Hysterectomy, HTN, HLD,  presented to the ER with hematuria, fatigue, nausea and back pain. Several days of gross hematuria with progressive fatigue and nausea. Patient also reported bilateral mid back pain. In the emergency room she was found afebrile, tachycardic, blood pressures were stable. Chest x-ray with right lower lobe atelectasis. Leukocytosis with white cell count of 22.8. Lactic acid 3.6. COVID-19 negative. Urinalysis with grossly abnormal urine. CT scan with no hydronephrosis or collection. Admitted and treated as severe sepsis due to UTI. Patient moved from [place] to [place] about 1 and half year ago and has not taken any of her blood pressure medicine or cholesterol medicines. She had similar episode about 2 months ago did not need hospitalization.  She was admitted with fatigue, nausea, scleral icterus.  She was found to have sepsis 2/2 UTI as well as jaundice with elevated LFT's.  UTI was treated with abx.  GI was consulted for abnormal LFT's which was thought to be 2/2 alcoholic hepatitis and severe steatohepatitis.  She has pending workup with labs including a liver biopsy.  Hospitalization c/b vtach on 10/12, workup per cardiology.  Hopefully d/c in next 24-48 hrs, currently receiving IV diuresis   Subjective: 10/16 afebrile overnight   Assessment & Plan: Covid vaccination; no vaccination   Principal Problem:   Sepsis due to urinary tract infection ([place]) Active Problems:   Hypokalemia   Sepsis ([place])   UTI (urinary tract infection)   Ventricular Tachycardia:  -Echo with EF 60-65% (see report) -10/13 metoprolol 25 mg BID -Cardiology recommends EP follow up with Dr. Lovena Le, consider cardiac MRI -Resolved -10/14 cardiology signed off -Strict in and out +7.5  L -Daily weight Filed Weights   11/02/19 0500 11/03/19 0459 11/04/19 0500  Weight: 117.4 kg 116.6 kg 115 kg    Essential HTN -See ventricular tachycardia  Direct Hyperbilirubinemia  Jaundice  Hepatic Steatosis: -Elevated bili and alk phos, mildly elevated AST - relatively stable today -RUQ [place] with cholelithiasis without findings c/w cholecystitis -Follow monospot - negative -Follow MRCP - notable for hepatomegaly and diffuse hepatic steatosis.  No obstructive cause for jaundice identified.  Gallbladder wall thickening (up to 0.6 cm) with multiple small gallstones.  Traces ascites.   Will consult gastroenterology  - awaiting AMA (negative) and ANA (negative).  ASMA (negative), a1at (elevated), ceruloplasm (wnl) -> s/p liver bx 10/11, follow notable for severely active steatohepatitis with cirrhosis with features suggestive of overlapping sepsis.  Concern for possible alcoholic hepatitis. Consider prednisolone [facility] discriminant function 39.8, it drops to 26 when using upper limit of normal for our lab 15 instead of 12), will defer to GI -> planning to f/u in 4-6 weeks per GI.  Liver cirrhosis stage IV -Stage IV/IV see results for core biopsy below  Bilateral LE edema:  -Hypotension with fluid overload. -10/14 albumin 25 g + Lasix IV 60 mg BID -10/15 Albumin 50 g; increase Lasix IV 60 mg TID -Most likely related to cirrhosis with volume overload and height follow-up anemia -Add spironolactone when transitioning to p.o. -[place] negative for DVT -See ventricular tachycardia -10/16 fluid restrict patient to 1 L fluid per day.  Patient understood that she was supposed to not drink significant amounts of fluid however states the staff had been urging her to drink more fluid.  Sign has not been  posted on her door  Severe sepsis positive E. coli UTI -Met guidelines for sepsis on admission HR > 90, RR> 20, sign of infection urinary tract.>  2 - Present on admission. Urine cx with e. Coli  sensitive to ancef, continue anitbiotics (7 days) Follow blood cultures - NGTD Sepsis physiology improved  Hypokalemia -Potassium goal > 4    Hypomagnesmia -Magnesium goal> 2   Hyperlipidemia with hepatic steatosis: -Elevated triglycerides, LDL not calculated.  HDL <10.  Continue to follow.  History of cervical cancer status post total hysterectomy: -In 2012. CT scan with no evidence of recurrent cancer or obstruction.  Abnormal CXR: -Repeat 10/7 with persistent bibasilar atelectasis/infiltrates.  Small L pleural effusion, possible.  Continue to monitor.  Goals of care -Per PT no PT follow-up required   DVT prophylaxis: Lovenox Code Status: Full Family Communication:  Status is: Inpatient    Dispo: The patient is from: Home              Anticipated d/c is to: Home              Anticipated d/c date is: 10/21              Patient currently unstable      Consultants:  Cardiology GI    Procedures/Significant Events:  10/6 MRCP:Hepatomegaly and diffuse hepatic steatosis. Slightly narrowed appearance of the common bile duct is probably incidental given the lack of upstream dilatation or abnormal wall thickening. No obstructive cause for jaundice is identified; if clinically warranted, nuclear medicine hepatobiliary scan could be utilized to assess for hepatocellular dysfunction. 2. Gallbladder wall thickening up to 0.6 cm, with multiple small gallstones. Although cholecystitis can produce gallbladder wall thickening, so can hypoalbuminemia (the patient currently has an albumin level of 2.1 grams/deciliter). 3. Trace ascites along the anterior border of the lateral segment left hepatic lobe. 4. 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 5. Mild bibasilar atelectasis. 6.  Aortic Atherosclerosis (ICD10-I70.0).  10/11 liver needle core biopsy;-severely active steatohepatitis (grade 3 of 3) with cirrhosis (stage 4  of 4)  - Features suggestive of  overlapping sepsis  10/12 echocardiogram;  1. Left ventricular ejection fraction, by estimation, is 60 to 65%. The  left ventricle has normal function. The left ventricle has no regional  wall motion abnormalities. Left ventricular diastolic parameters were  normal.  2. Right ventricular systolic function is normal. The right ventricular  size is normal.  3. The mitral valve is normal in structure. No evidence of mitral valve  regurgitation. No evidence of mitral stenosis.  4. The aortic valve is normal in structure. Aortic valve regurgitation is  not visualized. No aortic stenosis is present.  5. The inferior vena cava is normal in size with greater than 50%  respiratory variability, suggesting right atrial pressure of 3 mmHg.   LE [place]   Summary:  RIGHT:  - There is no evidence of deep vein thrombosis in the lower extremity.  However, portions of this examination were limited- see technologist  comments above.    - No cystic structure found in the popliteal fossa.    LEFT:  - There is no evidence of deep vein thrombosis in the lower extremity.  However, portions of this examination were limited- see technologist  comments above.    - No cystic structure found in the popliteal fossa.    I have personally reviewed and interpreted all radiology studies and my findings are as above.  VENTILATOR SETTINGS:    Cultures  10/5 urine positive E. Coli 10/5 blood RIGHT AC negative 10/5 HIV screen negative 10/5 acute hepatitis panel negative 10/5 SARS coronavirus negative 10/5 influenza A/B Negative    Antimicrobials: Anti-infectives (From admission, onward)   Start     Ordered Stop   10/26/19 1200  ceFAZolin (ANCEF) IVPB 1 g/50 mL premix        10/26/19 1044 10/30/19 2103   10/25/19 0330  azithromycin (ZITHROMAX) 500 mg in sodium chloride 0.9 % 250 mL IVPB        10/25/19 0321 10/25/19 0617   10/25/19 0145  cefTRIAXone (ROCEPHIN) 1 g in sodium chloride 0.9 % 100  mL IVPB  Status:  Discontinued        10/25/19 0132 10/26/19 1024       Devices    LINES / TUBES:      Continuous Infusions:   Objective: Vitals:   11/04/19 0632 11/04/19 1257 11/04/19 2114 11/05/19 0645  BP: 102/65 90/66 107/68 111/67  Pulse: 93 77 88 83  Resp: _0 Temp: 98.5 F (36.9 C) 98.1 F (36.7 C) 98.1 F (36.7 C) 98.6 F (37 C)  TempSrc: Oral Oral Oral Oral  SpO2: 96% 95% 94% 94%  Weight:      Height:        Intake/Output Summary (Last 24 hours) at 11/05/2019 1239 Last data filed at 11/04/2019 1821 Gross per 24 hour  Intake 155.05 ml  Output --  Net 155.05 ml   Filed Weights   11/02/19 0500 11/03/19 0459 11/04/19 0500  Weight: 117.4 kg 116.6 kg 115 kg   Physical Exam:  General: A/O x4, No acute respiratory distress Eyes: negative scleral hemorrhage, negative anisocoria, negative icterus ENT: Negative Runny nose, negative gingival bleeding, Neck:  Negative scars, masses, torticollis, lymphadenopathy, JVD Lungs: Clear to auscultation bilaterally without wheezes or crackles Cardiovascular: Regular rate and rhythm without murmur gallop or rub normal S1 and S2 Abdomen: OBESE, negative abdominal pain, nondistended, positive soft, bowel sounds, no rebound, no ascites, no appreciable mass Extremities: No significant cyanosis, clubbing, 2+ pitting edema to knees bilateral  Skin: Negative rashes, lesions, ulcers Psychiatric:  Negative depression, negative anxiety, negative fatigue, negative mania  Central nervous system:  Cranial nerves II through XII intact, tongue/uvula midline, all extremities muscle strength 5/5, sensation intact throughout,  negative dysarthria, negative expressive aphasia, negative receptive aphasia.  .     Data Reviewed: Care during the described time interval was provided by me .  I have reviewed this patient's available data, including medical history, events of note, physical examination, and all test results as part of  my evaluation.  CBC: Recent Labs  Lab 11/01/19 0500 11/02/19 0607 11/03/19 0442 11/04/19 0427 11/05/19 0526  WBC 16.3* 17.4* 16.1* 17.2* 18.4*  NEUTROABS 13.1* 13.7* 12.6* 13.2* 14.3*  HGB 9.6* 9.7* 10.0* 9.9* 10.1*  HCT 28.4* 29.0* 30.3* 28.3* 29.1*  MCV 101.8* 101.0* 103.1* 98.3 100.3*  PLT 345 351 374 367 417   Basic Metabolic Panel: Recent Labs  Lab 11/01/19 0500 11/02/19 0607 11/03/19 0442 11/04/19 0427 11/05/19 0526  NA 137 132* 135 136 137  K 3.7 3.9 4.1 3.3* 4.1  CL 102 99 98 96* 95*  CO2 _1 GLUCOSE 107* 104* 117* 98 103*  BUN _2 CREATININE 0.36* 0.47 0.52 0.54 0.56  CALCIUM 8.7* 8.4* 8.8* 8.6* 8.7*  MG 1.8 2.2 1.9 1.8 2.6*  PHOS 3.9 3.5 4.2 4.3 4.4  GFR: Estimated Creatinine Clearance: 108.4 mL/min (by C-G formula based on SCr of 0.56 mg/dL). Liver Function Tests: Recent Labs  Lab 11/01/19 0500 11/02/19 0607 11/03/19 0442 11/04/19 0427 11/05/19 0526  AST 81* 86* 93* 101* 119*  ALT _0 ALKPHOS 156* 170* 180* 167* 176*  BILITOT 12.0* 12.2* 12.9* 13.4* 14.3*  PROT 6.4* 6.8 7.0 6.9 7.3  ALBUMIN 1.9* 2.3* 2.1* 2.4* 2.6*   No results for input(s): LIPASE, AMYLASE in the last 168 hours. Recent Labs  Lab 11/05/19 0526  AMMONIA 32   Coagulation Profile: Recent Labs  Lab 10/30/19 0539 10/31/19 0531 11/01/19 0500 11/02/19 0607 11/03/19 0442  INR 1.5* 1.5* 1.5* 1.5* 1.5*   Cardiac Enzymes: No results for input(s): CKTOTAL, CKMB, CKMBINDEX, TROPONINI in the last 168 hours. BNP (last 3 results) No results for input(s): PROBNP in the last 8760 hours. HbA1C: No results for input(s): HGBA1C in the last 72 hours. CBG: No results for input(s): GLUCAP in the last 168 hours. Lipid Profile: No results for input(s): CHOL, HDL, LDLCALC, TRIG, CHOLHDL, LDLDIRECT in the last 72 hours. Thyroid Function Tests: No results for input(s): TSH, T4TOTAL, FREET4, T3FREE, THYROIDAB in the last 72 hours. Anemia Panel: No  results for input(s): VITAMINB12, FOLATE, FERRITIN, TIBC, IRON, RETICCTPCT in the last 72 hours. Sepsis Labs: No results for input(s): PROCALCITON, LATICACIDVEN in the last 168 hours.  No results found for this or any previous visit (from the past 240 hour(s)).       Radiology Studies: No results found.      Scheduled Meds: . enoxaparin (LOVENOX) injection  60 mg Subcutaneous Q24H  . famotidine  20 mg Oral BID  . furosemide  60 mg Intravenous TID  . metoprolol tartrate  25 mg Oral BID  . sodium chloride flush  3 mL Intravenous Q12H   Continuous Infusions:   LOS: 11 days    Time spent:40 min    , Geraldo Docker, MD [facility] Pager [phone]  If 7PM-7AM, please contact night-coverage [email or web address] Password [facility] 11/05/2019, 12:39 PM

## 2019-11-06 DIAGNOSIS — K7030 Alcoholic cirrhosis of liver without ascites: Secondary | ICD-10-CM | POA: Diagnosis not present

## 2019-11-06 DIAGNOSIS — R748 Abnormal levels of other serum enzymes: Secondary | ICD-10-CM | POA: Diagnosis not present

## 2019-11-06 DIAGNOSIS — E876 Hypokalemia: Secondary | ICD-10-CM | POA: Diagnosis not present

## 2019-11-06 DIAGNOSIS — A419 Sepsis, unspecified organism: Secondary | ICD-10-CM | POA: Diagnosis not present

## 2019-11-06 DIAGNOSIS — R945 Abnormal results of liver function studies: Secondary | ICD-10-CM | POA: Diagnosis not present

## 2019-11-06 DIAGNOSIS — N390 Urinary tract infection, site not specified: Secondary | ICD-10-CM | POA: Diagnosis not present

## 2019-11-06 DIAGNOSIS — R9389 Abnormal findings on diagnostic imaging of other specified body structures: Secondary | ICD-10-CM | POA: Diagnosis not present

## 2019-11-06 LAB — COMPREHENSIVE METABOLIC PANEL WITH GFR
ALT: 25 U/L (ref 0–44)
AST: 135 U/L — ABNORMAL HIGH (ref 15–41)
Albumin: 2.4 g/dL — ABNORMAL LOW (ref 3.5–5.0)
Alkaline Phosphatase: 190 U/L — ABNORMAL HIGH (ref 38–126)
Anion gap: 15 (ref 5–15)
BUN: 20 mg/dL (ref 6–20)
CO2: 30 mmol/L (ref 22–32)
Calcium: 8.9 mg/dL (ref 8.9–10.3)
Chloride: 93 mmol/L — ABNORMAL LOW (ref 98–111)
Creatinine, Ser: 0.74 mg/dL (ref 0.44–1.00)
GFR, Estimated: >60 mL/min
Glucose, Bld: 94 mg/dL (ref 70–99)
Potassium: 3.4 mmol/L — ABNORMAL LOW (ref 3.5–5.1)
Sodium: 138 mmol/L (ref 135–145)
Total Bilirubin: 14.4 mg/dL — ABNORMAL HIGH (ref 0.3–1.2)
Total Protein: 7.4 g/dL (ref 6.5–8.1)

## 2019-11-06 LAB — CBC WITH DIFFERENTIAL/PLATELET
Abs Immature Granulocytes: 0.14 K/uL — ABNORMAL HIGH (ref 0.00–0.07)
Basophils Absolute: 0.1 K/uL (ref 0.0–0.1)
Basophils Relative: 1 %
Eosinophils Absolute: 0.1 K/uL (ref 0.0–0.5)
Eosinophils Relative: 1 %
HCT: 29.5 % — ABNORMAL LOW (ref 36.0–46.0)
Hemoglobin: 10.1 g/dL — ABNORMAL LOW (ref 12.0–15.0)
Immature Granulocytes: 1 %
Lymphocytes Relative: 14 %
Lymphs Abs: 2.4 K/uL (ref 0.7–4.0)
MCH: 34.1 pg — ABNORMAL HIGH (ref 26.0–34.0)
MCHC: 34.2 g/dL (ref 30.0–36.0)
MCV: 99.7 fL (ref 80.0–100.0)
Monocytes Absolute: 0.9 K/uL (ref 0.1–1.0)
Monocytes Relative: 5 %
Neutro Abs: 13.5 K/uL — ABNORMAL HIGH (ref 1.7–7.7)
Neutrophils Relative %: 78 %
Platelets: 409 K/uL — ABNORMAL HIGH (ref 150–400)
RBC: 2.96 MIL/uL — ABNORMAL LOW (ref 3.87–5.11)
RDW: 17.6 % — ABNORMAL HIGH (ref 11.5–15.5)
WBC: 17.2 K/uL — ABNORMAL HIGH (ref 4.0–10.5)
nRBC: 0 % (ref 0.0–0.2)

## 2019-11-06 LAB — AMMONIA: Ammonia: 44 umol/L — ABNORMAL HIGH (ref 9–35)

## 2019-11-06 LAB — MAGNESIUM: Magnesium: 2.3 mg/dL (ref 1.7–2.4)

## 2019-11-06 LAB — PHOSPHORUS: Phosphorus: 4.8 mg/dL — ABNORMAL HIGH (ref 2.5–4.6)

## 2019-11-06 MED ORDER — FUROSEMIDE 10 MG/ML IJ SOLN
80.0000 mg | Freq: Four times a day (QID) | INTRAMUSCULAR | Status: DC
  Administered 2019-11-06 – 2019-11-08 (×6): 80 mg via INTRAVENOUS
  Filled 2019-11-06 (×7): qty 8

## 2019-11-06 MED ORDER — POTASSIUM CHLORIDE CRYS ER 20 MEQ PO TBCR
40.0000 meq | EXTENDED_RELEASE_TABLET | Freq: Once | ORAL | Status: AC
  Administered 2019-11-06: 40 meq via ORAL
  Filled 2019-11-06: qty 2

## 2019-11-06 NOTE — Progress Notes (Signed)
PROGRESS NOTE    Lydia Vasquez  BHA:[id number] DOB: Dec 26, 1960 DOA: 10/25/2019 PCP: Patient, No Pcp Per     Brief Narrative:  59 year old BF PMHx Depression, Anxiety, cervical cancer s/p Hysterectomy, HTN, HLD,  presented to the ER with hematuria, fatigue, nausea and back pain. Several days of gross hematuria with progressive fatigue and nausea. Patient also reported bilateral mid back pain. In the emergency room she was found afebrile, tachycardic, blood pressures were stable. Chest x-ray with right lower lobe atelectasis. Leukocytosis with white cell count of 22.8. Lactic acid 3.6. COVID-19 negative. Urinalysis with grossly abnormal urine. CT scan with no hydronephrosis or collection. Admitted and treated as severe sepsis due to UTI. Patient moved from [place] to [place] about 1 and half year ago and has not taken any of her blood pressure medicine or cholesterol medicines. She had similar episode about 2 months ago did not need hospitalization.  She was admitted with fatigue, nausea, scleral icterus.  She was found to have sepsis 2/2 UTI as well as jaundice with elevated LFT's.  UTI was treated with abx.  GI was consulted for abnormal LFT's which was thought to be 2/2 alcoholic hepatitis and severe steatohepatitis.  She has pending workup with labs including a liver biopsy.  Hospitalization c/b vtach on 10/12, workup per cardiology.  Hopefully d/c in next 24-48 hrs, currently receiving IV diuresis   Subjective: 10/17 afebrile overnight A/O x4, patient states she feels as if she has lost weight i.e. decreased WOB.  And decreased bilateral lower extremity edema   Assessment & Plan: Covid vaccination; no vaccination   Principal Problem:   Sepsis due to urinary tract infection ([place]) Active Problems:   Hypokalemia   Sepsis ([place])   UTI (urinary tract infection)   Ventricular Tachycardia:  -Echo with EF 60-65% (see report) -10/13 metoprolol 25 mg BID -Cardiology  recommends EP follow up with Dr. Lovena Le, consider cardiac MRI -Resolved -10/14 cardiology signed off -Strict in and out +7.9 L (not sure this is accurate reading as patient's weight continues to decrease) -Daily weight Filed Weights   11/03/19 0459 11/04/19 0500 11/06/19 0737  Weight: 116.6 kg 115 kg 113.4 kg    Essential HTN -See ventricular tachycardia  Direct Hyperbilirubinemia  Jaundice  Hepatic Steatosis/EtOH hepatitis: -Elevated bili and alk phos, mildly elevated AST - relatively stable today -RUQ [place] with cholelithiasis without findings c/w cholecystitis -Follow monospot - negative -Follow MRCP - notable for hepatomegaly and diffuse hepatic steatosis.  No obstructive cause for jaundice identified.  Gallbladder wall thickening (up to 0.6 cm) with multiple small gallstones.  Traces ascites.   -A1 antitrypsin elevated -ANA negative -Antimitochondrial antibody negative -Ceruloplasmin WNL -10/11 liver biopsy; consistent with liver cirrhosis see results below -Would consider Prednisolone ([place] Discriminant Function 39.8, it drops to 26 when using upper limit of normal for our lab 15 instead of 12), will defer to GI -> planning to f/u in 4-6 weeks per GI. -Schedule follow-up with Dr. Acquanetta Sit EtOH hepatitis/hepatic steatosis in 4 to 6 weeks  Liver cirrhosis stage IV -Stage IV/IV see results for core biopsy below  Bilateral LE edema:  -Hypotension with fluid overload. -10/14 albumin 25 g + Lasix IV 60 mg BID -10/15 Albumin 50 g; increase Lasix IV 60 mg TID -Most likely related to cirrhosis with volume overload and height follow-up anemia -Add spironolactone when transitioning to p.o. -[place] negative for DVT -See ventricular tachycardia -10/16 fluid restrict patient to 1 L fluid per day.  Patient understood that  she was supposed to not drink significant amounts of fluid however states the staff had been urging her to drink more fluid.  Sign has not been posted on her  door -10/17 increase Lasix IV 80 mg QID  Severe sepsis positive E. coli UTI -Met guidelines for sepsis on admission HR > 90, RR> 20, sign of infection urinary tract.>  2 - Present on admission. Urine cx with e. Coli sensitive to ancef, continue anitbiotics (7 days) Follow blood cultures - NGTD Sepsis physiology improved  Hypokalemia -Potassium goal > 4  -10/17 K-Dur 50 mEq  Hypomagnesmia -Magnesium goal> 2   Hyperlipidemia with hepatic steatosis: -Elevated triglycerides, LDL not calculated.  HDL <10.  Continue to follow.  History of cervical cancer status post total hysterectomy: -In 2012. CT scan with no evidence of recurrent cancer or obstruction.  Abnormal CXR: -Repeat 10/7 with persistent bibasilar atelectasis/infiltrates.  Small L pleural effusion, possible.  Continue to monitor.  Goals of care -Per PT, no PT follow-up required   DVT prophylaxis: Lovenox Code Status: Full Family Communication:  Status is: Inpatient    Dispo: The patient is from: Home              Anticipated d/c is to: Home              Anticipated d/c date is: 10/18              Patient currently unstable      Consultants:  Cardiology GI    Procedures/Significant Events:  10/6 MRCP:-Hepatomegaly and diffuse hepatic steatosis. Slightly narrowed appearance of the common bile duct is probably incidental given the lack of upstream dilatation or abnormal wall thickening.  -No obstructive cause for jaundice is identified;  -Gallbladder wall thickening up to 0.6 cm, with multiple small gallstones. Although cholecystitis can produce gallbladder wall thickening, so can hypoalbuminemia (the patient currently has an albumin level of 2.1 grams/deciliter). -Trace ascites along the anterior border of the lateral segment left hepatic lobe. -Small suspected hemangioma in the upper spleen. -. Mild bibasilar atelectasis. 10/11 liver needle core biopsy;-severely active steatohepatitis (grade 3  of 3) with cirrhosis (stage 4  of 4)  - Features suggestive of overlapping sepsis  10/12 echocardiogram; LVEF= 60 to 65%. 10/12 lower extremity Doppler ultrasound; negative DVT bilateral lower extremity      I have personally reviewed and interpreted all radiology studies and my findings are as above.  VENTILATOR SETTINGS:    Cultures 10/5 urine positive E. Coli 10/5 blood RIGHT AC negative 10/5 HIV screen negative 10/5 acute hepatitis panel negative 10/5 SARS coronavirus negative 10/5 influenza A/B Negative    Antimicrobials: Anti-infectives (From admission, onward)   Start     Ordered Stop   10/26/19 1200  ceFAZolin (ANCEF) IVPB 1 g/50 mL premix        10/26/19 1044 10/30/19 2103   10/25/19 0330  azithromycin (ZITHROMAX) 500 mg in sodium chloride 0.9 % 250 mL IVPB        10/25/19 0321 10/25/19 0617   10/25/19 0145  cefTRIAXone (ROCEPHIN) 1 g in sodium chloride 0.9 % 100 mL IVPB  Status:  Discontinued        10/25/19 0132 10/26/19 1024       Devices    LINES / TUBES:      Continuous Infusions:   Objective: Vitals:   11/05/19 2111 11/06/19 0536 11/06/19 0737 11/06/19 1238  BP: 93/66 107/72  100/61  Pulse: 80 83  78  Resp: 15 18  18  Temp: 97.9 F (36.6 C) 98.2 F (36.8 C)  98.4 F (36.9 C)  TempSrc: Oral Oral  Oral  SpO2: 92% 92%  95%  Weight:   113.4 kg   Height:        Intake/Output Summary (Last 24 hours) at 11/06/2019 1442 Last data filed at 11/06/2019 1354 Gross per 24 hour  Intake 618 ml  Output 203 ml  Net 415 ml   Filed Weights   11/03/19 0459 11/04/19 0500 11/06/19 0737  Weight: 116.6 kg 115 kg 113.4 kg   Physical Exam:  General: A/O x4, No acute respiratory distress Eyes: negative scleral hemorrhage, negative anisocoria, negative icterus ENT: Negative Runny nose, negative gingival bleeding, Neck:  Negative scars, masses, torticollis, lymphadenopathy, JVD Lungs: Clear to auscultation bilaterally without wheezes or  crackles Cardiovascular: Regular rate and rhythm without murmur gallop or rub normal S1 and S2 Abdomen: OBESE, negative abdominal pain, nondistended, positive soft, bowel sounds, no rebound, no ascites, no appreciable mass Extremities: No significant cyanosis, clubbing, 2+ bilateral lower extremity edema to knees Skin: Negative rashes, lesions, ulcers Psychiatric:  Negative depression, negative anxiety, negative fatigue, negative mania  Central nervous system:  Cranial nerves II through XII intact, tongue/uvula midline, all extremities muscle strength 5/5, sensation intact throughout,negative dysarthria, negative expressive aphasia, negative receptive aphasia. .     Data Reviewed: Care during the described time interval was provided by me .  I have reviewed this patient's available data, including medical history, events of note, physical examination, and all test results as part of my evaluation.  CBC: Recent Labs  Lab 11/02/19 0607 11/03/19 0442 11/04/19 0427 11/05/19 0526 11/06/19 0452  WBC 17.4* 16.1* 17.2* 18.4* 17.2*  NEUTROABS 13.7* 12.6* 13.2* 14.3* 13.5*  HGB 9.7* 10.0* 9.9* 10.1* 10.1*  HCT 29.0* 30.3* 28.3* 29.1* 29.5*  MCV 101.0* 103.1* 98.3 100.3* 99.7  PLT 351 374 367 398 932*   Basic Metabolic Panel: Recent Labs  Lab 11/02/19 0607 11/03/19 0442 11/04/19 0427 11/05/19 0526 11/06/19 0452  NA 132* 135 136 137 138  K 3.9 4.1 3.3* 4.1 3.4*  CL 99 98 96* 95* 93*  CO2 '24 27 27 29 30  ' GLUCOSE 104* 117* 98 103* 94  BUN '10 12 13 16 20  ' CREATININE 0.47 0.52 0.54 0.56 0.74  CALCIUM 8.4* 8.8* 8.6* 8.7* 8.9  MG 2.2 1.9 1.8 2.6* 2.3  PHOS 3.5 4.2 4.3 4.4 4.8*   GFR: Estimated Creatinine Clearance: 107.5 mL/min (by C-G formula based on SCr of 0.74 mg/dL). Liver Function Tests: Recent Labs  Lab 11/02/19 0607 11/03/19 0442 11/04/19 0427 11/05/19 0526 11/06/19 0452  AST 86* 93* 101* 119* 135*  ALT '17 16 19 23 25  ' ALKPHOS 170* 180* 167* 176* 190*  BILITOT 12.2*  12.9* 13.4* 14.3* 14.4*  PROT 6.8 7.0 6.9 7.3 7.4  ALBUMIN 2.3* 2.1* 2.4* 2.6* 2.4*   No results for input(s): LIPASE, AMYLASE in the last 168 hours. Recent Labs  Lab 11/05/19 0526 11/06/19 0452  AMMONIA 32 44*   Coagulation Profile: Recent Labs  Lab 10/31/19 0531 11/01/19 0500 11/02/19 0607 11/03/19 0442  INR 1.5* 1.5* 1.5* 1.5*   Cardiac Enzymes: No results for input(s): CKTOTAL, CKMB, CKMBINDEX, TROPONINI in the last 168 hours. BNP (last 3 results) No results for input(s): PROBNP in the last 8760 hours. HbA1C: No results for input(s): HGBA1C in the last 72 hours. CBG: No results for input(s): GLUCAP in the last 168 hours. Lipid Profile: No results for input(s): CHOL, HDL,  LDLCALC, TRIG, CHOLHDL, LDLDIRECT in the last 72 hours. Thyroid Function Tests: No results for input(s): TSH, T4TOTAL, FREET4, T3FREE, THYROIDAB in the last 72 hours. Anemia Panel: No results for input(s): VITAMINB12, FOLATE, FERRITIN, TIBC, IRON, RETICCTPCT in the last 72 hours. Sepsis Labs: No results for input(s): PROCALCITON, LATICACIDVEN in the last 168 hours.  No results found for this or any previous visit (from the past 240 hour(s)).       Radiology Studies: No results found.      Scheduled Meds: . enoxaparin (LOVENOX) injection  60 mg Subcutaneous Q24H  . famotidine  20 mg Oral BID  . furosemide  60 mg Intravenous TID  . metoprolol tartrate  25 mg Oral BID  . sodium chloride flush  3 mL Intravenous Q12H   Continuous Infusions:   LOS: 12 days    Time spent:40 min    Tonjua Rossetti, Geraldo Docker, MD [facility] Pager [phone]  If 7PM-7AM, please contact night-coverage [email or web address] Password [email or web address] 11/06/2019, 2:42 PM

## 2019-11-06 NOTE — Plan of Care (Signed)
  Problem: Health Behavior/Discharge Planning: Goal: Ability to manage health-related needs will improve Outcome: Progressing   Problem: Nutrition: Goal: Adequate nutrition will be maintained Outcome: Progressing   

## 2019-11-07 ENCOUNTER — Inpatient Hospital Stay (HOSPITAL_COMMUNITY): Payer: Medicaid Other

## 2019-11-07 DIAGNOSIS — K7010 Alcoholic hepatitis without ascites: Secondary | ICD-10-CM | POA: Diagnosis not present

## 2019-11-07 DIAGNOSIS — K7581 Nonalcoholic steatohepatitis (NASH): Secondary | ICD-10-CM | POA: Diagnosis not present

## 2019-11-07 DIAGNOSIS — K7030 Alcoholic cirrhosis of liver without ascites: Secondary | ICD-10-CM | POA: Diagnosis not present

## 2019-11-07 DIAGNOSIS — A419 Sepsis, unspecified organism: Secondary | ICD-10-CM | POA: Diagnosis not present

## 2019-11-07 DIAGNOSIS — N390 Urinary tract infection, site not specified: Secondary | ICD-10-CM | POA: Diagnosis not present

## 2019-11-07 DIAGNOSIS — E876 Hypokalemia: Secondary | ICD-10-CM | POA: Diagnosis not present

## 2019-11-07 DIAGNOSIS — R9389 Abnormal findings on diagnostic imaging of other specified body structures: Secondary | ICD-10-CM | POA: Diagnosis not present

## 2019-11-07 DIAGNOSIS — R945 Abnormal results of liver function studies: Secondary | ICD-10-CM | POA: Diagnosis not present

## 2019-11-07 DIAGNOSIS — R188 Other ascites: Secondary | ICD-10-CM | POA: Diagnosis not present

## 2019-11-07 DIAGNOSIS — K7460 Unspecified cirrhosis of liver: Secondary | ICD-10-CM | POA: Diagnosis not present

## 2019-11-07 DIAGNOSIS — R748 Abnormal levels of other serum enzymes: Secondary | ICD-10-CM | POA: Diagnosis not present

## 2019-11-07 LAB — CBC WITH DIFFERENTIAL/PLATELET
Abs Immature Granulocytes: 0.1 10*3/uL — ABNORMAL HIGH (ref 0.00–0.07)
Basophils Absolute: 0.1 10*3/uL (ref 0.0–0.1)
Basophils Relative: 1 %
Eosinophils Absolute: 0.1 10*3/uL (ref 0.0–0.5)
Eosinophils Relative: 1 %
HCT: 30.1 % — ABNORMAL LOW (ref 36.0–46.0)
Hemoglobin: 10.3 g/dL — ABNORMAL LOW (ref 12.0–15.0)
Immature Granulocytes: 1 %
Lymphocytes Relative: 11 %
Lymphs Abs: 2.2 10*3/uL (ref 0.7–4.0)
MCH: 35 pg — ABNORMAL HIGH (ref 26.0–34.0)
MCHC: 34.2 g/dL (ref 30.0–36.0)
MCV: 102.4 fL — ABNORMAL HIGH (ref 80.0–100.0)
Monocytes Absolute: 0.9 10*3/uL (ref 0.1–1.0)
Monocytes Relative: 5 %
Neutro Abs: 15.9 10*3/uL — ABNORMAL HIGH (ref 1.7–7.7)
Neutrophils Relative %: 81 %
Platelets: 399 10*3/uL (ref 150–400)
RBC: 2.94 MIL/uL — ABNORMAL LOW (ref 3.87–5.11)
RDW: 17.4 % — ABNORMAL HIGH (ref 11.5–15.5)
WBC: 19.3 10*3/uL — ABNORMAL HIGH (ref 4.0–10.5)
nRBC: 0 % (ref 0.0–0.2)

## 2019-11-07 LAB — COMPREHENSIVE METABOLIC PANEL
ALT: 28 U/L (ref 0–44)
AST: 159 U/L — ABNORMAL HIGH (ref 15–41)
Albumin: 2.5 g/dL — ABNORMAL LOW (ref 3.5–5.0)
Alkaline Phosphatase: 195 U/L — ABNORMAL HIGH (ref 38–126)
Anion gap: 15 (ref 5–15)
BUN: 22 mg/dL — ABNORMAL HIGH (ref 6–20)
CO2: 30 mmol/L (ref 22–32)
Calcium: 9 mg/dL (ref 8.9–10.3)
Chloride: 93 mmol/L — ABNORMAL LOW (ref 98–111)
Creatinine, Ser: 0.86 mg/dL (ref 0.44–1.00)
GFR, Estimated: >60 mL/min (ref >60)
Glucose, Bld: 128 mg/dL — ABNORMAL HIGH (ref 70–99)
Potassium: 3.7 mmol/L (ref 3.5–5.1)
Sodium: 138 mmol/L (ref 135–145)
Total Bilirubin: 14.9 mg/dL — ABNORMAL HIGH (ref 0.3–1.2)
Total Protein: 7.8 g/dL (ref 6.5–8.1)

## 2019-11-07 LAB — MAGNESIUM: Magnesium: 2.2 mg/dL (ref 1.7–2.4)

## 2019-11-07 LAB — AMMONIA: Ammonia: 47 umol/L — ABNORMAL HIGH (ref 9–35)

## 2019-11-07 LAB — PHOSPHORUS: Phosphorus: 4.5 mg/dL (ref 2.5–4.6)

## 2019-11-07 NOTE — Progress Notes (Signed)
PROGRESS NOTE    Lydia Vasquez  ZOX:[id number] DOB: 1960/04/30 DOA: 10/25/2019 PCP: Patient, No Pcp Per     Brief Narrative:  60 year old BF PMHx Depression, Anxiety, cervical cancer s/p Hysterectomy, HTN, HLD,  presented to the ER with hematuria, fatigue, nausea and back pain. Several days of gross hematuria with progressive fatigue and nausea. Patient also reported bilateral mid back pain. In the emergency room she was found afebrile, tachycardic, blood pressures were stable. Chest x-ray with right lower lobe atelectasis. Leukocytosis with white cell count of 22.8. Lactic acid 3.6. COVID-19 negative. Urinalysis with grossly abnormal urine. CT scan with no hydronephrosis or collection. Admitted and treated as severe sepsis due to UTI. Patient moved from [place] to [place] about 1 and half year ago and has not taken any of her blood pressure medicine or cholesterol medicines. She had similar episode about 2 months ago did not need hospitalization.  She was admitted with fatigue, nausea, scleral icterus.  She was found to have sepsis 2/2 UTI as well as jaundice with elevated LFT's.  UTI was treated with abx.  GI was consulted for abnormal LFT's which was thought to be 2/2 alcoholic hepatitis and severe steatohepatitis.  She has pending workup with labs including a liver biopsy.  Hospitalization c/b vtach on 10/12, workup per cardiology.  Hopefully d/c in next 24-48 hrs, currently receiving IV diuresis   Subjective: 10/18 afebrile overnight, A/O x4.  Negative abdominal pain except over RUQ which has been constant.  Negative nausea, negative vomiting.    Assessment & Plan: Covid vaccination; no vaccination   Principal Problem:   Sepsis due to urinary tract infection ([place]) Active Problems:   Hypokalemia   Sepsis ([place])   UTI (urinary tract infection)   Ventricular Tachycardia:  -Echo with EF 60-65% (see report) -10/13 metoprolol 25 mg BID -Cardiology recommends EP  follow up with Dr. Lovena Le, consider cardiac MRI -Resolved -10/14 cardiology signed off -Strict in and out +7.1 L (not sure this is accurate reading as patient's weight continues to decrease) -Daily weight Filed Weights   11/04/19 0500 11/06/19 0737 11/07/19 0500  Weight: 115 kg 113.4 kg 111.9 kg    Essential HTN -See ventricular tachycardia  Direct Hyperbilirubinemia  Jaundice  Hepatic Steatosis/EtOH hepatitis: -10/18 elevated bili and alk phos, mildly elevated AST beginning to trend up. -RUQ [place] with cholelithiasis without findings c/w cholecystitis -Follow monospot - negative -Follow MRCP - notable for hepatomegaly and diffuse hepatic steatosis.  No obstructive cause for jaundice identified.  Gallbladder wall thickening (up to 0.6 cm) with multiple small gallstones.  Traces ascites.   -A1 antitrypsin elevated -ANA negative -Antimitochondrial antibody negative -Ceruloplasmin WNL -10/11 liver biopsy; consistent with liver cirrhosis see results below -Would consider Prednisolone ([place] Discriminant Function 39.8, it drops to 26 when using upper limit of normal for our lab 15 instead of 12), will defer to GI -> planning to f/u in 4-6 weeks per GI. -10/18 with patient's liver enzymes beginning to trend up and leukocytosis held patient's discharge.  Contacted [facility] spoke with Dr. Alessandra Bevels, who stated Dr. Cristina Gong would round on patient today.  Spoke with PA Ebony Hail from [facility] they are going to discuss plan of care, will await further recommendations -Schedule follow-up with Dr. Acquanetta Sit EtOH hepatitis/hepatic steatosis in 4 to 6 weeks  Liver cirrhosis stage IV -Stage IV/IV see results for core biopsy below  Bilateral LE edema:  -Hypotension with fluid overload. -10/14 albumin 25 g + Lasix IV 60 mg BID -  10/15 Albumin 50 g; increase Lasix IV 60 mg TID -Most likely related to cirrhosis with volume overload and height follow-up anemia -Add spironolactone when transitioning to  p.o. -[place] negative for DVT -See ventricular tachycardia -10/16 fluid restrict patient to 1 L fluid per day.  Patient understood that she was supposed to not drink significant amounts of fluid however states the staff had been urging her to drink more fluid.  Sign has not been posted on her door -10/17 increase Lasix IV 80 mg QID  Severe sepsis positive E. coli UTI -Met guidelines for sepsis on admission HR > 90, RR> 20, sign of infection urinary tract.>  2 - Present on admission. Urine cx with e. Coli sensitive to ancef, continue anitbiotics (7 days) Follow blood cultures - NGTD Sepsis physiology improved  Hypokalemia -Potassium goal > 4  -10/17 K-Dur 50 mEq  Hypomagnesmia -Magnesium goal> 2  Hyperlipidemia with hepatic steatosis: -Elevated triglycerides, LDL not calculated.  HDL <10.  Continue to follow.  History of cervical cancer status post total hysterectomy: -In 2012. CT scan with no evidence of recurrent cancer or obstruction.  Abnormal CXR: -Repeat 10/7 with persistent bibasilar atelectasis/infiltrates.  Small L pleural effusion, possible.  Continue to monitor.  Goals of care -Per PT, no PT follow-up required   DVT prophylaxis: Lovenox Code Status: Full Family Communication: 10/18 directed to speak to older daughter concerning medical issues Lydia Vasquez [phone].  Spoke with daughter counseled on plan of care answered all questions Status is: Inpatient    Dispo: The patient is from: Home              Anticipated d/c is to: Home              Anticipated d/c date is: 10/18              Patient currently unstable      Consultants:  Cardiology [facility] Dr Penelope Coop     Procedures/Significant Events:  10/6 MRCP:-Hepatomegaly and diffuse hepatic steatosis. Slightly narrowed appearance of the common bile duct is probably incidental given the lack of upstream dilatation or abnormal wall thickening.  -No obstructive cause for jaundice is identified;   -Gallbladder wall thickening up to 0.6 cm, with multiple small gallstones. Although cholecystitis can produce gallbladder wall thickening, so can hypoalbuminemia (the patient currently has an albumin level of 2.1 grams/deciliter). -Trace ascites along the anterior border of the lateral segment left hepatic lobe. -Small suspected hemangioma in the upper spleen. -. Mild bibasilar atelectasis. 10/11 liver needle core biopsy;-severely active steatohepatitis (grade 3 of 3) with cirrhosis (stage 4  of 4)  - Features suggestive of overlapping sepsis  10/12 echocardiogram; LVEF= 60 to 65%. 10/12 lower extremity Doppler ultrasound; negative DVT bilateral lower extremity      I have personally reviewed and interpreted all radiology studies and my findings are as above.  VENTILATOR SETTINGS:    Cultures 10/5 urine positive E. Coli 10/5 blood RIGHT AC negative 10/5 HIV screen negative 10/5 acute hepatitis panel negative 10/5 SARS coronavirus negative 10/5 influenza A/B Negative    Antimicrobials: Anti-infectives (From admission, onward)   Start     Ordered Stop   10/26/19 1200  ceFAZolin (ANCEF) IVPB 1 g/50 mL premix        10/26/19 1044 10/30/19 2103   10/25/19 0330  azithromycin (ZITHROMAX) 500 mg in sodium chloride 0.9 % 250 mL IVPB        10/25/19 0321 10/25/19 0617   10/25/19 0145  cefTRIAXone (ROCEPHIN)  1 g in sodium chloride 0.9 % 100 mL IVPB  Status:  Discontinued        10/25/19 0132 10/26/19 1024       Devices    LINES / TUBES:      Continuous Infusions:   Objective: Vitals:   11/06/19 2024 11/06/19 2129 11/07/19 0500 11/07/19 0547  BP: 100/67 102/62  104/72  Pulse: 84 85  87  Resp: '18 18  18  ' Temp: 98 F (36.7 C) 98.5 F (36.9 C)  98.2 F (36.8 C)  TempSrc: Oral Oral  Oral  SpO2: 97% 95%  91%  Weight:   111.9 kg   Height:        Intake/Output Summary (Last 24 hours) at 11/07/2019 1118 Last data filed at 11/07/2019 0354 Gross per 24 hour   Intake 414 ml  Output 1500 ml  Net -1086 ml   Filed Weights   11/04/19 0500 11/06/19 0737 11/07/19 0500  Weight: 115 kg 113.4 kg 111.9 kg   Physical Exam:  General: A/O x4, No acute respiratory distress Eyes: negative scleral hemorrhage, negative anisocoria, negative icterus ENT: Negative Runny nose, negative gingival bleeding, Neck:  Negative scars, masses, torticollis, lymphadenopathy, JVD Lungs: Clear to auscultation bilaterally without wheezes or crackles Cardiovascular: Regular rate and rhythm without murmur gallop or rub normal S1 and S2 Abdomen: OBESE, positive abdominal pain RUQ, nondistended, positive soft, bowel sounds, no rebound, no ascites, no appreciable mass Extremities: No significant cyanosis, clubbing, or edema bilateral lower extremities Skin: Negative rashes, lesions, ulcers Psychiatric:  Negative depression, negative anxiety, negative fatigue, negative mania  Central nervous system:  Cranial nerves II through XII intact, tongue/uvula midline, all extremities muscle strength 5/5, sensation intact throughout, negative dysarthria, negative expressive aphasia, negative receptive aphasia.  .     Data Reviewed: Care during the described time interval was provided by me .  I have reviewed this patient's available data, including medical history, events of note, physical examination, and all test results as part of my evaluation.  CBC: Recent Labs  Lab 11/03/19 0442 11/04/19 0427 11/05/19 0526 11/06/19 0452 11/07/19 0447  WBC 16.1* 17.2* 18.4* 17.2* 19.3*  NEUTROABS 12.6* 13.2* 14.3* 13.5* 15.9*  HGB 10.0* 9.9* 10.1* 10.1* 10.3*  HCT 30.3* 28.3* 29.1* 29.5* 30.1*  MCV 103.1* 98.3 100.3* 99.7 102.4*  PLT 374 367 398 409* 536   Basic Metabolic Panel: Recent Labs  Lab 11/03/19 0442 11/04/19 0427 11/05/19 0526 11/06/19 0452 11/07/19 0447  NA 135 136 137 138 138  K 4.1 3.3* 4.1 3.4* 3.7  CL 98 96* 95* 93* 93*  CO2 '27 27 29 30 30  ' GLUCOSE 117* 98 103*  94 128*  BUN '12 13 16 20 ' 22*  CREATININE 0.52 0.54 0.56 0.74 0.86  CALCIUM 8.8* 8.6* 8.7* 8.9 9.0  MG 1.9 1.8 2.6* 2.3 2.2  PHOS 4.2 4.3 4.4 4.8* 4.5   GFR: Estimated Creatinine Clearance: 99.4 mL/min (by C-G formula based on SCr of 0.86 mg/dL). Liver Function Tests: Recent Labs  Lab 11/03/19 0442 11/04/19 0427 11/05/19 0526 11/06/19 0452 11/07/19 0447  AST 93* 101* 119* 135* 159*  ALT '16 19 23 25 28  ' ALKPHOS 180* 167* 176* 190* 195*  BILITOT 12.9* 13.4* 14.3* 14.4* 14.9*  PROT 7.0 6.9 7.3 7.4 7.8  ALBUMIN 2.1* 2.4* 2.6* 2.4* 2.5*   No results for input(s): LIPASE, AMYLASE in the last 168 hours. Recent Labs  Lab 11/05/19 0526 11/06/19 0452 11/07/19 0447  AMMONIA 32 44* 47*   Coagulation  Profile: Recent Labs  Lab 11/01/19 0500 11/02/19 0607 11/03/19 0442  INR 1.5* 1.5* 1.5*   Cardiac Enzymes: No results for input(s): CKTOTAL, CKMB, CKMBINDEX, TROPONINI in the last 168 hours. BNP (last 3 results) No results for input(s): PROBNP in the last 8760 hours. HbA1C: No results for input(s): HGBA1C in the last 72 hours. CBG: No results for input(s): GLUCAP in the last 168 hours. Lipid Profile: No results for input(s): CHOL, HDL, LDLCALC, TRIG, CHOLHDL, LDLDIRECT in the last 72 hours. Thyroid Function Tests: No results for input(s): TSH, T4TOTAL, FREET4, T3FREE, THYROIDAB in the last 72 hours. Anemia Panel: No results for input(s): VITAMINB12, FOLATE, FERRITIN, TIBC, IRON, RETICCTPCT in the last 72 hours. Sepsis Labs: No results for input(s): PROCALCITON, LATICACIDVEN in the last 168 hours.  No results found for this or any previous visit (from the past 240 hour(s)).       Radiology Studies: No results found.      Scheduled Meds: . enoxaparin (LOVENOX) injection  60 mg Subcutaneous Q24H  . famotidine  20 mg Oral BID  . furosemide  80 mg Intravenous QID  . metoprolol tartrate  25 mg Oral BID  . sodium chloride flush  3 mL Intravenous Q12H   Continuous  Infusions:   LOS: 13 days    Time spent:40 min    Traycen Goyer, Geraldo Docker, MD [facility] Pager [phone]  If 7PM-7AM, please contact night-coverage [email or web address] Password [email or web address] 11/07/2019, 11:18 AM

## 2019-11-07 NOTE — Progress Notes (Signed)
[facility] Progress Note  Lydia Vasquez 59 y.o. September 21, 1960  CC: Persistently rising WBCs  Subjective: Reports diffuse abdominal pain, though pain is worse in RUQ. She is tolerating a diet.  Denies any nausea or vomiting.   ROS : Review of Systems  Cardiovascular: Negative for chest pain and palpitations.  Gastrointestinal: Positive for abdominal pain. Negative for blood in stool, constipation, diarrhea, heartburn, melena, nausea and vomiting.   Objective: Vital signs in last 24 hours: Vitals:   11/06/19 2129 11/07/19 0547  BP: 102/62 104/72  Pulse: 85 87  Resp: 18 18  Temp: 98.5 F (36.9 C) 98.2 F (36.8 C)  SpO2: 95% 91%    Physical Exam:  General:  Alert, oriented, cooperative, no distress, appears stated age  Head:  Normocephalic, without obvious abnormality, atraumatic  Eyes:  Deep icterus, EOMs intact  Lungs:   Clear to auscultation bilaterally, respirations unlabored  Heart:  Regular rate and rhythm, S1, S2 normal  Abdomen:   Soft and mildly distended with mild diffuse tenderness, bowel sounds active all four quadrants, no guarding or peritoneal signs  Extremities: Extremities normal, atraumatic, no  edema  Pulses: 2+ and symmetric    Lab Results: Recent Labs    11/06/19 0452 11/07/19 0447  NA 138 138  K 3.4* 3.7  CL 93* 93*  CO2 30 30  GLUCOSE 94 128*  BUN 20 22*  CREATININE 0.74 0.86  CALCIUM 8.9 9.0  MG 2.3 2.2  PHOS 4.8* 4.5   Recent Labs    11/06/19 0452 11/07/19 0447  AST 135* 159*  ALT 25 28  ALKPHOS 190* 195*  BILITOT 14.4* 14.9*  PROT 7.4 7.8  ALBUMIN 2.4* 2.5*   Recent Labs    11/06/19 0452 11/07/19 0447  WBC 17.2* 19.3*  NEUTROABS 13.5* 15.9*  HGB 10.1* 10.3*  HCT 29.5* 30.1*  MCV 99.7 102.4*  PLT 409* 399   No results for input(s): LABPROT, INR in the last 72 hours.    Assessment: Cirrhosis, steatohepatitis, suspected alcoholic hepatitis, and persistently rising WBCs despite treatment of UTI -Liver biopsy  results 10/11: Severely active steatohepatitis (grade 3 of 3) with cirrhosis (stage 4 of 4).  Features suggestive of overlapping sepsis. -Hepatic discriminant function >32 (with PT baseline of 13) -Persistently rising WBCs despite treatment of UTI (today, WBCs 19.3); alcoholic hepatitis vs. Infectious etiology (SBP) -LFTs continue to rise: T. Bili 14.9/ AST 159/ALT 28/ALP195, most likely due to alcoholic hepatitis -INR elevated to 1.5 with PT 17.5 as of 11/03/2019 -Acute hepatitis panel negative 10/25/19 -ANA negative 10/8, AMA and ASMA negative 10/9 -Ceruloplasmin normal, no alpha 1 antitrypsin deficiency -Normal ferritin 10/9  Incidental cholelithiasis, gallbladder wall thickening. Cholecystitis vs hypoalbuminemia, albumin 2.0.   Plan: Paracentesis to rule out SBP (if enough ascitic fluid present).    Consider initiation of prednisolone if infectious etiology of leukocytosis is ruled out.  Continue conservative management.    [facility] will follow.  Edrick Kins PA-C 11/07/2019, 11:14 AM  Contact #  [phone]

## 2019-11-07 NOTE — Progress Notes (Signed)
Patient ID: Lydia Vasquez, female   DOB: December 09, 1960, 59 y.o.   MRN: [id number] Pt presented to [place] dept today for paracentesis; on limited US abd in all four quadrants there is no significant ascites present. Procedure cancelled. Pt informed.

## 2019-11-08 DIAGNOSIS — R945 Abnormal results of liver function studies: Secondary | ICD-10-CM | POA: Diagnosis not present

## 2019-11-08 DIAGNOSIS — R0602 Shortness of breath: Secondary | ICD-10-CM

## 2019-11-08 DIAGNOSIS — E876 Hypokalemia: Secondary | ICD-10-CM | POA: Diagnosis not present

## 2019-11-08 DIAGNOSIS — K7460 Unspecified cirrhosis of liver: Secondary | ICD-10-CM | POA: Diagnosis not present

## 2019-11-08 DIAGNOSIS — N390 Urinary tract infection, site not specified: Secondary | ICD-10-CM | POA: Diagnosis not present

## 2019-11-08 DIAGNOSIS — A419 Sepsis, unspecified organism: Secondary | ICD-10-CM | POA: Diagnosis not present

## 2019-11-08 DIAGNOSIS — K7010 Alcoholic hepatitis without ascites: Secondary | ICD-10-CM | POA: Diagnosis not present

## 2019-11-08 DIAGNOSIS — K7030 Alcoholic cirrhosis of liver without ascites: Secondary | ICD-10-CM | POA: Diagnosis not present

## 2019-11-08 DIAGNOSIS — K7581 Nonalcoholic steatohepatitis (NASH): Secondary | ICD-10-CM | POA: Diagnosis not present

## 2019-11-08 DIAGNOSIS — D72829 Elevated white blood cell count, unspecified: Secondary | ICD-10-CM

## 2019-11-08 DIAGNOSIS — R748 Abnormal levels of other serum enzymes: Secondary | ICD-10-CM | POA: Diagnosis not present

## 2019-11-08 LAB — COMPREHENSIVE METABOLIC PANEL
ALT: 33 U/L (ref 0–44)
AST: 157 U/L — ABNORMAL HIGH (ref 15–41)
Albumin: 2.4 g/dL — ABNORMAL LOW (ref 3.5–5.0)
Alkaline Phosphatase: 181 U/L — ABNORMAL HIGH (ref 38–126)
Anion gap: 15 (ref 5–15)
BUN: 25 mg/dL — ABNORMAL HIGH (ref 6–20)
CO2: 28 mmol/L (ref 22–32)
Calcium: 8.8 mg/dL — ABNORMAL LOW (ref 8.9–10.3)
Chloride: 93 mmol/L — ABNORMAL LOW (ref 98–111)
Creatinine, Ser: 0.74 mg/dL (ref 0.44–1.00)
GFR, Estimated: >60 mL/min (ref >60)
Glucose, Bld: 96 mg/dL (ref 70–99)
Potassium: 3.2 mmol/L — ABNORMAL LOW (ref 3.5–5.1)
Sodium: 136 mmol/L (ref 135–145)
Total Bilirubin: 13.9 mg/dL — ABNORMAL HIGH (ref 0.3–1.2)
Total Protein: 7.6 g/dL (ref 6.5–8.1)

## 2019-11-08 LAB — CBC WITH DIFFERENTIAL/PLATELET
Abs Immature Granulocytes: 0.09 10*3/uL — ABNORMAL HIGH (ref 0.00–0.07)
Basophils Absolute: 0.1 10*3/uL (ref 0.0–0.1)
Basophils Relative: 1 %
Eosinophils Absolute: 0.1 10*3/uL (ref 0.0–0.5)
Eosinophils Relative: 1 %
HCT: 28 % — ABNORMAL LOW (ref 36.0–46.0)
Hemoglobin: 9.8 g/dL — ABNORMAL LOW (ref 12.0–15.0)
Immature Granulocytes: 1 %
Lymphocytes Relative: 14 %
Lymphs Abs: 2.5 10*3/uL (ref 0.7–4.0)
MCH: 34.9 pg — ABNORMAL HIGH (ref 26.0–34.0)
MCHC: 35 g/dL (ref 30.0–36.0)
MCV: 99.6 fL (ref 80.0–100.0)
Monocytes Absolute: 1 10*3/uL (ref 0.1–1.0)
Monocytes Relative: 6 %
Neutro Abs: 14.4 10*3/uL — ABNORMAL HIGH (ref 1.7–7.7)
Neutrophils Relative %: 77 %
Platelets: 403 10*3/uL — ABNORMAL HIGH (ref 150–400)
RBC: 2.81 MIL/uL — ABNORMAL LOW (ref 3.87–5.11)
RDW: 17.1 % — ABNORMAL HIGH (ref 11.5–15.5)
WBC: 18.2 10*3/uL — ABNORMAL HIGH (ref 4.0–10.5)
nRBC: 0 % (ref 0.0–0.2)

## 2019-11-08 LAB — AMMONIA: Ammonia: 43 umol/L — ABNORMAL HIGH (ref 9–35)

## 2019-11-08 LAB — PHOSPHORUS: Phosphorus: 5 mg/dL — ABNORMAL HIGH (ref 2.5–4.6)

## 2019-11-08 LAB — MAGNESIUM: Magnesium: 2.2 mg/dL (ref 1.7–2.4)

## 2019-11-08 MED ORDER — FUROSEMIDE 40 MG PO TABS
80.0000 mg | ORAL_TABLET | Freq: Four times a day (QID) | ORAL | Status: DC
  Administered 2019-11-08: 80 mg via ORAL
  Filled 2019-11-08: qty 2

## 2019-11-08 MED ORDER — FUROSEMIDE 80 MG PO TABS
80.0000 mg | ORAL_TABLET | Freq: Three times a day (TID) | ORAL | 0 refills | Status: DC
Start: 2019-11-08 — End: 2020-06-11

## 2019-11-08 MED ORDER — POTASSIUM CHLORIDE CRYS ER 20 MEQ PO TBCR: 40.0000 meq | EXTENDED_RELEASE_TABLET | Freq: Every day | ORAL | Status: DC

## 2019-11-08 MED ORDER — POTASSIUM CHLORIDE CRYS ER 20 MEQ PO TBCR
50.0000 meq | EXTENDED_RELEASE_TABLET | Freq: Once | ORAL | Status: AC
  Administered 2019-11-08: 50 meq via ORAL
  Filled 2019-11-08: qty 1

## 2019-11-08 MED ORDER — ONDANSETRON HCL 4 MG PO TABS: 4.0000 mg | ORAL_TABLET | Freq: Four times a day (QID) | ORAL | 0 refills | Status: DC | PRN

## 2019-11-08 MED ORDER — METOPROLOL TARTRATE 25 MG PO TABS
25.0000 mg | ORAL_TABLET | Freq: Two times a day (BID) | ORAL | 0 refills | Status: DC
Start: 2019-11-08 — End: 2020-06-11

## 2019-11-08 MED ORDER — POTASSIUM CHLORIDE CRYS ER 20 MEQ PO TBCR: 40.0000 meq | EXTENDED_RELEASE_TABLET | Freq: Every day | ORAL | 0 refills | Status: DC

## 2019-11-08 MED ORDER — FAMOTIDINE 20 MG PO TABS
20.0000 mg | ORAL_TABLET | Freq: Two times a day (BID) | ORAL | 0 refills | Status: DC
Start: 2019-11-08 — End: 2020-06-11

## 2019-11-08 NOTE — Plan of Care (Signed)
  Problem: Clinical Measurements: Goal: Respiratory complications will improve Outcome: Adequate for Discharge Goal: Cardiovascular complication will be avoided Outcome: Adequate for Discharge   Problem: Activity: Goal: Risk for activity intolerance will decrease Outcome: Adequate for Discharge   Problem: Nutrition: Goal: Adequate nutrition will be maintained Outcome: Adequate for Discharge   Problem: Coping: Goal: Level of anxiety will decrease Outcome: Adequate for Discharge   

## 2019-11-08 NOTE — Discharge Summary (Addendum)
Physician Discharge Summary  Jillianna Stanek UMP:[id number] DOB: 09/11/1960 DOA: 10/25/2019  PCP: Patient, No Pcp Per  Admit date: 10/25/2019 Discharge date: 11/08/2019  Time spent: 35 minutes  Recommendations for Outpatient Follow-up:   Covid vaccination; no vaccination  Ventricular Tachycardia:  -Echo with EF 60-65% (see report) -10/13 metoprolol 25 mg BID -Resolved -10/14 cardiology signed off -Strict in and out  +6.6 L -Daily weight Filed Weights   11/06/19 0737 11/07/19 0500 11/08/19 0447  Weight: 113.4 kg 111.9 kg 111.3 kg  -Follow-up with cardiology  in 2 weeks with EP Dr. Cristopher Peru, per [facility] recommendation secondary to ventricular tachycardia during hospitalization. Consider cardiac MRI  Essential HTN -See ventricular tachycardia  Direct Hyperbilirubinemia   Jaundice   Hepatic Steatosis/EtOH hepatitis: -10/18 elevated bili and alk phos, mildly elevated AST beginning to trend up. -RUQ [place] with cholelithiasis without findings c/w cholecystitis -Follow monospot - negative -Follow MRCP - notable for hepatomegaly and diffuse hepatic steatosis. No obstructive cause for jaundice identified. Gallbladder wall thickening (up to 0.6 cm) with multiple small gallstones. Traces ascites.  -A1 antitrypsin elevated -ANA negative -Antimitochondrial antibody negative -Ceruloplasmin WNL -10/11 liver biopsy; consistent with liver cirrhosis see results below -Would consider Prednisolone ([place] Discriminant Function 39.8, it drops to 26 when using upper limit of normal for our lab 15 instead of 12), will defer to GI-> planning to f/u in 4-6 weeks per GI. -10/18 with patient's liver enzymes beginning to trend up and leukocytosis held patient's discharge.  Contacted [facility] spoke with Dr. Alessandra Bevels, who stated Dr. Cristina Gong would round on patient today.  Spoke with PA Ebony Hail from [facility] they are going to discuss plan of care, will await further recommendations -[facility] evaluated  patient, and has scheduled follow-up appointment with Dr. Therisa Doyne on 11/19.  Liver cirrhosis stage IV -Stage IV/IV see results for core biopsy below  Bilateral LE edema:  -Hypotension with fluid overload. -10/14 albumin 25 g + Lasix IV 60 mg BID -10/15 Albumin 50 g; increase Lasix IV 60 mg TID -Most likely related to cirrhosis with volume overload and height follow-up anemia -[place] negative for DVT -See ventricular tachycardia -Limit fluid intake to 1 L/day. -Lasix p.o. 80 mg TID; Lasix has been working well for patient WILL NOT add spironolactone at this time.  Will defer to cardiology or GI if they wish to add at her follow-up appointment.  Severe sepsis positive E. coli UTI -Met guidelines for sepsis on admission HR > 90, RR> 20, sign of infection urinary tract.>  2 -Present on admission. -Urine cx with e. Coli sensitive to ancef, completed 7 days of antibiotics -Follow blood cultures - NGTD -Sepsis physiology improved  Hypokalemia -Potassium goal > 4  -10/19 K-Dur 50 mEq prior to discharge -K-Dur 40 mEq QID  Hypomagnesmia -Magnesium goal> 2  Hyperlipidemia with hepatic steatosis: -Elevated triglycerides, LDL not calculated. HDL <10. Continue to follow.  History of cervical cancer status post total hysterectomy: -In 2012. CT scan with no evidence of recurrent cancer or obstruction.  Abnormal CXR: -Repeat 10/7 with persistent bibasilar atelectasis/infiltrates. Small L pleural effusion, possible. Continue to monitor.  Leukocytosis/Elevated liver enzymes -GI feels that elevation secondary to EtOH hepatitis and active steatohepatitis.  -10/19 GI has signed off and states patient is safe for discharge.     Goals of care -Per PT, no PT follow-up required   Discharge Diagnoses:  Principal Problem:   Sepsis due to urinary tract infection ([place]) Active Problems:   Hypokalemia   Sepsis ([place])  UTI (urinary tract infection)   Discharge Condition:  Stable  Diet recommendation: 2g/day sodium in his diet.  1 L of fluid in a day.    Filed Weights   11/06/19 0737 11/07/19 0500 11/08/19 0447  Weight: 113.4 kg 111.9 kg 111.3 kg    History of present illness:  59 year old BF PMHx Depression, Anxiety, cervical cancer s/p Hysterectomy, HTN, HLD,  presented to the ER with hematuria, fatigue, nausea and back pain. Several days of gross hematuria with progressive fatigue and nausea. Patient also reported bilateral mid back pain. In the emergency room she was found afebrile, tachycardic, blood pressures were stable. Chest x-ray with right lower lobe atelectasis. Leukocytosis with white cell count of 22.8. Lactic acid 3.6. COVID-19 negative. Urinalysis with grossly abnormal urine. CT scan with no hydronephrosis or collection. Admitted and treated as severe sepsis due to UTI. Patient moved from [place] to [place] about 1 and half year ago and has not taken any of her blood pressure medicine or cholesterol medicines. She had similar episode about 2 months ago did not need hospitalization.  She was admitted with fatigue, nausea, scleral icterus. She was found to have sepsis 2/2 UTI as well as jaundice with elevated LFT's. UTI was treated with abx. GI was consulted for abnormal LFT's which was thought to be 2/2 alcoholic hepatitis and severe steatohepatitis. She has pending workup with labs including a liver biopsy. Hospitalization c/b vtach on 10/12, workup per cardiology.Hopefully d/c in next 24-48 hrs, currently receiving IV diuresis  Hospital Course:  See above  Procedures: 10/6 MRCP:-Hepatomegaly and diffuse hepatic steatosis. Slightly narrowed appearance of the common bile duct is probably incidental given the lack of upstream dilatation or abnormal wall thickening.  -No obstructive cause for jaundice is identified;  -Gallbladder wall thickening up to 0.6 cm, with multiple small gallstones. Although cholecystitis can  produce gallbladder wall thickening, so can hypoalbuminemia (the patient currently has an albumin level of 2.1 grams/deciliter). -Trace ascites along the anterior border of the lateral segment left hepatic lobe. -Small suspected hemangioma in the upper spleen. -. Mild bibasilar atelectasis. 10/11 liver needle core biopsy;-severely active steatohepatitis (grade 3 of 3) with cirrhosis (stage 4  of 4)  - Features suggestive of overlapping sepsis  10/12 echocardiogram; LVEF= 60 to 65%. 10/12 lower extremity Doppler ultrasound; negative DVT bilateral lower extremity 10/18 US paracentesis;on limited US abd in all four quadrants there is no significant ascites present. Procedure cancelled.   Consultations: Cardiology [facility] Dr Penelope Coop    Cultures  10/5 urine positive E. Coli 10/5 blood RIGHT AC negative 10/5 HIV screen negative 10/5 acute hepatitis panel negative 10/5 SARS coronavirus negative 10/5 influenza A/B Negative   Antibiotics Anti-infectives (From admission, onward)   Start     Ordered Stop   10/26/19 1200  ceFAZolin (ANCEF) IVPB 1 g/50 mL premix        10/26/19 1044 10/30/19 2103   10/25/19 0330  azithromycin (ZITHROMAX) 500 mg in sodium chloride 0.9 % 250 mL IVPB        10/25/19 0321 10/25/19 0617   10/25/19 0145  cefTRIAXone (ROCEPHIN) 1 g in sodium chloride 0.9 % 100 mL IVPB  Status:  Discontinued        10/25/19 0132 10/26/19 1024       Discharge Exam: Vitals:   11/07/19 1351 11/07/19 2111 11/08/19 0027 11/08/19 0447  BP: (!) 91/59 (!) 92/56 (!) 98/59 111/69  Pulse: 73 86 84 90  Resp: 18   18  Temp: 97.7 F (36.5 C)   98.5 F (36.9 C)  TempSrc: Oral   Oral  SpO2: 95%   93%  Weight:    111.3 kg  Height:        General: A/O x4, No acute respiratory distress Eyes: negative scleral hemorrhage, negative anisocoria, negative icterus ENT: Negative Runny nose, negative gingival bleeding, Neck:  Negative scars, masses, torticollis, lymphadenopathy, JVD Lungs:  Clear to auscultation bilaterally without wheezes or crackles Cardiovascular: Regular rate and rhythm without murmur gallop or rub normal S1 and S2 Abdomen: OBESE, positive abdominal pain RUQ, nondistended, positive soft, bowel sounds, no rebound, no ascites, no appreciable mass   Discharge Instructions   Allergies as of 11/08/2019   No Known Allergies     Medication List    STOP taking these medications   potassium chloride 10 MEQ tablet Commonly known as: KLOR-CON     TAKE these medications   famotidine 20 MG tablet Commonly known as: PEPCID Take 1 tablet (20 mg total) by mouth 2 (two) times daily.   furosemide 80 MG tablet Commonly known as: LASIX Take 1 tablet (80 mg total) by mouth in the morning, at noon, and at bedtime.   metoprolol tartrate 25 MG tablet Commonly known as: LOPRESSOR Take 1 tablet (25 mg total) by mouth 2 (two) times daily.   ondansetron 4 MG tablet Commonly known as: ZOFRAN Take 1 tablet (4 mg total) by mouth every 6 (six) hours as needed for nausea.   potassium chloride SA 20 MEQ tablet Commonly known as: KLOR-CON Take 2 tablets (40 mEq total) by mouth daily. Start taking on: November 09, 2019      No Known Allergies  Follow-up Information    Ronnette Juniper, MD. Go on 12/09/2019.   Specialty: Gastroenterology Why: Follow-up appointment scheduled for November 19 at 3:30 PM Contact information: [address] [place] [address] [phone]        Evans Lance, MD. Schedule an appointment as soon as possible for a visit in 2 week(s).   Specialty: Cardiology Why: -Follow-up with cardiology  in 2 weeks with EP Dr. Cristopher Peru, per [facility] recommendation secondary to ventricular tachycardia during hospitalization. Consider cardiac MRI Contact information: [address] [place] [address] [phone]                The results of significant diagnostics from this hospitalization (including  imaging, microbiology, ancillary and laboratory) are listed below for reference.    Significant Diagnostic Studies: CT ABDOMEN PELVIS W CONTRAST  Result Date: 10/25/2019 CLINICAL DATA:  Abdominal abscess/infection suspected EXAM: CT ABDOMEN AND PELVIS WITH CONTRAST TECHNIQUE: Multidetector CT imaging of the abdomen and pelvis was performed using the standard protocol following bolus administration of intravenous contrast. CONTRAST:  122m OMNIPAQUE IOHEXOL 300 MG/ML  SOLN COMPARISON:  07/05/2019 noncontrast abdominal CT FINDINGS: Lower chest: Atelectasis or scarring at the lung bases, also seen on prior. Hepatobiliary: Hepatic steatosis which is marked.Layering gallstone. No detected acute inflammation or gallbladder over distension. No bile duct dilatation. Pancreas: Unremarkable. Spleen: Nonspecific subcapsular low-density in the upper spleen, likely subtly present on prior. In isolation, usually these lesions are incidental and noncontributory. Adrenals/Urinary Tract: Negative adrenals. No hydronephrosis or stone. Unremarkable bladder. Stomach/Bowel:  No obstruction. No evidence of bowel inflammation. Vascular/Lymphatic: No acute vascular abnormality. Multifocal atherosclerosis, notably extensive for age. No mass or adenopathy. Reproductive:Hysterectomy. Other: No ascites or pneumoperitoneum. Musculoskeletal: No acute abnormalities. IMPRESSION: 1. No acute finding. 2. Severe  hepatic steatosis. 3. Cholelithiasis. 4. Atherosclerosis. Electronically Signed   By: Monte Fantasia M.D.   On: 10/25/2019 07:09   US BIOPSY (LIVER)  Result Date: 10/31/2019 INDICATION: Elevated LFTs of uncertain etiology. Please perform ultrasound-guided biopsy for tissue diagnostic purposes. EXAM: ULTRASOUND GUIDED LIVER BIOPSY COMPARISON:  Right upper quadrant abdominal ultrasound-10/25/2019; abdominal MRI-10/27/2018 MEDICATIONS: None ANESTHESIA/SEDATION: Fentanyl 100 mcg IV; Versed 3 mg IV Total Moderate Sedation time: 12  minutes; The patient was continuously monitored during the procedure by the interventional radiology nurse under my direct supervision. COMPLICATIONS: None immediate. PROCEDURE: Informed written consent was obtained from the patient after a discussion of the risks, benefits and alternatives to treatment. The patient understands and consents the procedure. A timeout was performed prior to the initiation of the procedure. Ultrasound scanning was performed of the right upper abdominal quadrant and the procedure was planned. The right upper abdomen was prepped and draped in the usual sterile fashion. The overlying soft tissues were anesthetized with 1% lidocaine with epinephrine. A 17 gauge, 6.8 cm co-axial needle was advanced into a peripheral aspect of the right lobe of the liver and 3 core biopsies were obtained with an 18 gauge core device under direct ultrasound guidance. The co-axial needle track was embolized with the administration of a Gel-Foam slurry. Superficial hemostasis was obtained with manual compression. Post procedural scanning was negative for definitive area of hemorrhage. A dressing was placed. The patient tolerated the procedure well without immediate post procedural complication. IMPRESSION: Technically successful ultrasound guided liver biopsy. Electronically Signed   By: Sandi Mariscal M.D.   On: 10/31/2019 11:53   DG CHEST PORT 1 VIEW  Result Date: 11/02/2019 CLINICAL DATA:  Short of breath and hematuria EXAM: PORTABLE CHEST 1 VIEW COMPARISON:  10/27/2019 FINDINGS: Elevated right hemidiaphragm with right lower lobe atelectasis again noted. Mild left lower lobe atelectasis improved. Negative for heart failure or effusion. IMPRESSION: Bibasilar atelectasis with mild improvement on the left. No new finding. Electronically Signed   By: Franchot Gallo M.D.   On: 11/02/2019 14:09   DG CHEST PORT 1 VIEW  Result Date: 10/27/2019 CLINICAL DATA:  History of abnormal chest x-ray. EXAM: PORTABLE  CHEST 1 VIEW COMPARISON:  10/25/2019. FINDINGS: Persistent bibasilar atelectasis/infiltrates with slight improvement in aeration on today's exam. Small left pleural effusion cannot be excluded. No pneumothorax. IMPRESSION: Persistent bibasilar atelectasis/infiltrates with slight improvement in aeration on today's exam. Small left pleural effusion cannot be excluded. Electronically Signed   By: Marcello Moores  Register   On: 10/27/2019 06:02   DG Chest Port 1 View  Result Date: 10/25/2019 CLINICAL DATA:  Sepsis EXAM: PORTABLE CHEST 1 VIEW COMPARISON:  None. FINDINGS: The heart size and mediastinal contours are within normal limits. Shallow degree of aeration with subsegmental atelectasis is seen. There is also patchy airspace opacity seen within the right infrahilar region. Again noted is elevation of the right hemidiaphragm. No acute osseous abnormality. IMPRESSION: Shallow degree of aeration with patchy airspace opacity at the right lung base which could be due to atelectasis and/or early infectious etiology. Electronically Signed   By: Prudencio Pair M.D.   On: 10/25/2019 02:34   MR ABDOMEN MRCP W WO CONTAST  Result Date: 10/27/2019 CLINICAL DATA:  Jaundice.  Hepatic steatosis and cholelithiasis. EXAM: MRI ABDOMEN WITHOUT AND WITH CONTRAST (INCLUDING MRCP) TECHNIQUE: Multiplanar multisequence MR imaging of the abdomen was performed both before and after the administration of intravenous contrast. Heavily T2-weighted images of the biliary and pancreatic ducts were obtained, and three-dimensional MRCP images  were rendered by post processing. CONTRAST:  63m GADAVIST GADOBUTROL 1 MMOL/ML IV SOLN COMPARISON:  CT abdomen 10/25/2019 FINDINGS: Despite efforts by the technologist and patient, motion artifact is present on today's exam and could not be eliminated. This reduces exam sensitivity and specificity. Lower chest: Mild atelectasis along both hemidiaphragms, as on the recent CT. Hepatobiliary: Hepatomegaly with  craniocaudad extent of the liver 24.7 cm. Widespread hepatic steatosis, more striking along the margins of the liver. Gallbladder wall thickening up to 0.6 cm, with multiple small gallstones. There is some questionable narrowing of the common hepatic duct for example on image 31/16. I not see definite abnormal enhancement along the walls of the duct in this vicinity, nor is there is significant amount of upstream biliary dilatation to suggest a significant degree of stenosis/stricture. Pancreas: Unremarkable Spleen: T2 hyperintense 1.6 by 1.3 cm lesion of the upper spleen demonstrates progressive marginal enhancement and is probably a hemangioma. Adrenals/Urinary Tract:  Unremarkable Stomach/Bowel: Unremarkable Vascular/Lymphatic: Aortoiliac atherosclerotic vascular disease. No pathologic adenopathy identified. Other: No supplemental non-categorized findings. Trace ascites along the anterior border of the lateral segment left hepatic lobe. Musculoskeletal: Unremarkable IMPRESSION: 1. Hepatomegaly and diffuse hepatic steatosis. Slightly narrowed appearance of the common bile duct is probably incidental given the lack of upstream dilatation or abnormal wall thickening. No obstructive cause for jaundice is identified; if clinically warranted, nuclear medicine hepatobiliary scan could be utilized to assess for hepatocellular dysfunction. 2. Gallbladder wall thickening up to 0.6 cm, with multiple small gallstones. Although cholecystitis can produce gallbladder wall thickening, so can hypoalbuminemia (the patient currently has an albumin level of 2.1 grams/deciliter). 3. Trace ascites along the anterior border of the lateral segment left hepatic lobe. 4. 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 5. Mild bibasilar atelectasis. 6.  Aortic Atherosclerosis (ICD10-I70.0). Electronically Signed   By: WVan ClinesM.D.   On: 10/27/2019 08:53   ECHOCARDIOGRAM COMPLETE  Result Date: 11/01/2019    ECHOCARDIOGRAM  REPORT   Patient Name:   TDEEANNE DEININGERDate of Exam: 11/01/2019 Medical Rec #:  [id number]    Height:       71.0 in Accession #:    [id number]   Weight:       273.6 lb Date of Birth:  [birth year]    BSA:          2.409 m Patient Age:    89years      BP:           122/75 mmHg Patient Gender: F             HR:           101 bpm. Exam Location:  Inpatient Procedure: 2D Echo Indications:    Ventricular Tachycardia I47.2  History:        Patient has no prior history of Echocardiogram examinations.                 Risk Factors:Hypertension and Dyslipidemia. Sepsis due to                 urinary tract infection, hepatic steatosis, past history of                 cancer.  Sonographer:    TDarlina SicilianRDCS Referring Phys: [id number] CALDWELL POWELL JRock[place] 1. Left ventricular ejection fraction, by estimation, is 60 to 65%. The left ventricle has normal function. The left ventricle has no regional wall motion abnormalities. Left ventricular diastolic parameters  were normal.  2. Right ventricular systolic function is normal. The right ventricular size is normal.  3. The mitral valve is normal in structure. No evidence of mitral valve regurgitation. No evidence of mitral stenosis.  4. The aortic valve is normal in structure. Aortic valve regurgitation is not visualized. No aortic stenosis is present.  5. The inferior vena cava is normal in size with greater than 50% respiratory variability, suggesting right atrial pressure of 3 mmHg. FINDINGS  Left Ventricle: Left ventricular ejection fraction, by estimation, is 60 to 65%. The left ventricle has normal function. The left ventricle has no regional wall motion abnormalities. The left ventricular internal cavity size was normal in size. There is  no left ventricular hypertrophy. Left ventricular diastolic parameters were normal. Right Ventricle: The right ventricular size is normal. No increase in right ventricular wall thickness. Right ventricular systolic function is  normal. Left Atrium: Left atrial size was normal in size. Right Atrium: Right atrial size was normal in size. Pericardium: There is no evidence of pericardial effusion. Mitral Valve: The mitral valve is normal in structure. No evidence of mitral valve regurgitation. No evidence of mitral valve stenosis. Tricuspid Valve: The tricuspid valve is normal in structure. Tricuspid valve regurgitation is not demonstrated. No evidence of tricuspid stenosis. Aortic Valve: The aortic valve is normal in structure. Aortic valve regurgitation is not visualized. No aortic stenosis is present. Pulmonic Valve: The pulmonic valve was normal in structure. Pulmonic valve regurgitation is not visualized. No evidence of pulmonic stenosis. Aorta: The aortic root is normal in size and structure. Venous: The inferior vena cava is normal in size with greater than 50% respiratory variability, suggesting right atrial pressure of 3 mmHg. IAS/Shunts: No atrial level shunt detected by color flow Doppler.  LEFT VENTRICLE PLAX 2D LVIDd:         4.85 cm  Diastology LVIDs:         3.15 cm  LV e' medial:    11.40 cm/s LV PW:         1.04 cm  LV E/e' medial:  8.7 LV IVS:        1.07 cm  LV e' lateral:   12.90 cm/s LVOT diam:     2.10 cm  LV E/e' lateral: 7.7 LV SV:         79 LV SV Index:   33 LVOT Area:     3.46 cm  RIGHT VENTRICLE RV S prime:     12.60 cm/s TAPSE (M-mode): 1.8 cm LEFT ATRIUM           Index LA diam:      3.50 cm 1.45 cm/m LA Vol (A2C): 26.8 ml 11.13 ml/m LA Vol (A4C): 33.3 ml 13.82 ml/m  AORTIC VALVE LVOT Vmax:   131.00 cm/s LVOT Vmean:  78.500 cm/s LVOT VTI:    0.228 m  AORTA Ao Root diam: 3.40 cm MITRAL VALVE MV Area (PHT): 3.53 cm    SHUNTS MV Decel Time: 215 msec    Systemic VTI:  0.23 m MV E velocity: 99.00 cm/s  Systemic Diam: 2.10 cm MV A velocity: 98.50 cm/s MV E/A ratio:  1.01 Mihai Croitoru MD Electronically signed by Sanda Klein MD Signature Date/Time: 11/01/2019/2:47:25 PM    Final    [place] ASCITES (ABDOMEN  LIMITED)  Result Date: 11/07/2019 CLINICAL DATA:  Leukocytosis.  Cirrhosis. EXAM: LIMITED ABDOMEN ULTRASOUND FOR ASCITES TECHNIQUE: Limited ultrasound survey for ascites was performed in all four abdominal quadrants. COMPARISON:  Abdominal MRI 10/26/2019  FINDINGS: All 4 quadrants of the abdomen were scanned, and no ascites was visualized. IMPRESSION: No ascites. Electronically Signed   By: Logan Bores M.D.   On: 11/07/2019 15:56   VAS [place] LOWER EXTREMITY VENOUS (DVT)  Result Date: 11/01/2019  Lower Venous DVT Study Indications: Edema.  Limitations: Body habitus and poor ultrasound/tissue interface. Comparison Study: No prior studies. Performing Technologist: Oliver Hum RVT  Examination Guidelines: A complete evaluation includes B-mode imaging, spectral Doppler, color Doppler, and power Doppler as needed of all accessible portions of each vessel. Bilateral testing is considered an integral part of a complete examination. Limited examinations for reoccurring indications may be performed as noted. The reflux portion of the exam is performed with the patient in reverse Trendelenburg.  +---------+---------------+---------+-----------+----------+--------------+  RIGHT     Compressibility Phasicity Spontaneity Properties Thrombus Aging  +---------+---------------+---------+-----------+----------+--------------+  CFV       Full            Yes       Yes                                    +---------+---------------+---------+-----------+----------+--------------+  SFJ       Full                                                             +---------+---------------+---------+-----------+----------+--------------+  FV Prox   Full                                                             +---------+---------------+---------+-----------+----------+--------------+  FV Mid    Full                                                             +---------+---------------+---------+-----------+----------+--------------+   FV Distal Full                                                             +---------+---------------+---------+-----------+----------+--------------+  PFV       Full                                                             +---------+---------------+---------+-----------+----------+--------------+  POP       Full            Yes       Yes                                    +---------+---------------+---------+-----------+----------+--------------+  PTV       Full                                                             +---------+---------------+---------+-----------+----------+--------------+  PERO                                                       Not visualized  +---------+---------------+---------+-----------+----------+--------------+   +---------+---------------+---------+-----------+----------+--------------+  LEFT      Compressibility Phasicity Spontaneity Properties Thrombus Aging  +---------+---------------+---------+-----------+----------+--------------+  CFV       Full            Yes       Yes                                    +---------+---------------+---------+-----------+----------+--------------+  SFJ       Full                                                             +---------+---------------+---------+-----------+----------+--------------+  FV Prox   Full                                                             +---------+---------------+---------+-----------+----------+--------------+  FV Mid    Full                                                             +---------+---------------+---------+-----------+----------+--------------+  FV Distal Full                                                             +---------+---------------+---------+-----------+----------+--------------+  PFV       Full                                                             +---------+---------------+---------+-----------+----------+--------------+  POP       Full            Yes       Yes                                     +---------+---------------+---------+-----------+----------+--------------+  PTV       Full                                                             +---------+---------------+---------+-----------+----------+--------------+  PERO                                                       Not visualized  +---------+---------------+---------+-----------+----------+--------------+     Summary: RIGHT: - There is no evidence of deep vein thrombosis in the lower extremity. However, portions of this examination were limited- see technologist comments above.  - No cystic structure found in the popliteal fossa.  LEFT: - There is no evidence of deep vein thrombosis in the lower extremity. However, portions of this examination were limited- see technologist comments above.  - No cystic structure found in the popliteal fossa.  *See table(s) above for measurements and observations. Electronically signed by Deitra Mayo MD on 11/01/2019 at 8:15:22 PM.    Final    US Abdomen Limited RUQ  Result Date: 10/25/2019 CLINICAL DATA:  Right upper quadrant abdominal pain EXAM: ULTRASOUND ABDOMEN LIMITED RIGHT UPPER QUADRANT COMPARISON:  CT scan 10/25/2019 FINDINGS: Gallbladder: Numerous small echogenic shadowing gallstones noted dependently in the gallbladder. No gallbladder wall thickening, pericholecystic fluid or sonographic Murphy sign to suggest acute cholecystitis. The largest calculus measures 1 cm. Common bile duct: Diameter: 6.0 mm Liver: There is diffuse increased echogenicity of the liver and decreased through transmission consistent with fatty infiltration. No focal lesions or biliary dilatation. Portal vein is patent on color Doppler imaging with normal direction of blood flow towards the liver. Other: None. IMPRESSION: 1. Cholelithiasis without sonographic findings for acute cholecystitis. 2. No intra or extrahepatic biliary dilatation. 3. Advanced fatty infiltration of the liver.  Electronically Signed   By: Marijo Sanes M.D.   On: 10/25/2019 16:32    Microbiology: No results found for this or any previous visit (from the past 240 hour(s)).   Labs: Basic Metabolic Panel: Recent Labs  Lab 11/04/19 0427 11/05/19 0526 11/06/19 0452 11/07/19 0447 11/08/19 0425  NA 136 137 138 138 136  K 3.3* 4.1 3.4* 3.7 3.2*  CL 96* 95* 93* 93* 93*  CO2 _0 GLUCOSE 98 103* 94 128* 96  BUN _1 22* 25*  CREATININE 0.54 0.56 0.74 0.86 0.74  CALCIUM 8.6* 8.7* 8.9 9.0 8.8*  MG 1.8 2.6* 2.3 2.2 2.2  PHOS 4.3 4.4 4.8* 4.5 5.0*   Liver Function Tests: Recent Labs  Lab 11/04/19 0427 11/05/19 0526 11/06/19 0452 11/07/19 0447 11/08/19 0425  AST 101* 119* 135* 159* 157*  ALT _2 33  ALKPHOS 167* 176* 190* 195* 181*  BILITOT 13.4* 14.3* 14.4* 14.9* 13.9*  PROT 6.9 7.3 7.4 7.8 7.6  ALBUMIN 2.4* 2.6* 2.4* 2.5* 2.4*   No results for input(s): LIPASE, AMYLASE in the last 168 hours. Recent Labs  Lab 11/05/19 0526 11/06/19 0452 11/07/19 0447 11/08/19 0425  AMMONIA 32 44* 47* 43*   CBC: Recent Labs  Lab 11/04/19 0427 11/05/19 0526 11/06/19 0452 11/07/19 0447 11/08/19 0425  WBC 17.2* 18.4* 17.2* 19.3*  18.2*  NEUTROABS 13.2* 14.3* 13.5* 15.9* 14.4*  HGB 9.9* 10.1* 10.1* 10.3* 9.8*  HCT 28.3* 29.1* 29.5* 30.1* 28.0*  MCV 98.3 100.3* 99.7 102.4* 99.6  PLT 367 398 409* 399 403*   Cardiac Enzymes: No results for input(s): CKTOTAL, CKMB, CKMBINDEX, TROPONINI in the last 168 hours. BNP: BNP (last 3 results) Recent Labs    11/01/19 0500 11/02/19 0607  BNP 189.4* 325.8*    ProBNP (last 3 results) No results for input(s): PROBNP in the last 8760 hours.  CBG: No results for input(s): GLUCAP in the last 168 hours.     Signed:  Dia Crawford, MD [facility] [phone] pager

## 2019-11-08 NOTE — Progress Notes (Signed)
Pt was discharged and left the Rx. Pt was contacted and she said her pharmacy is at  [facility] [address]. Called the pharmacy and spoke with the pharmacist that I could faxed the Rx. Pt Rx was faxed and let the pt know she can pick up her medicines at that location.

## 2019-11-08 NOTE — Progress Notes (Signed)
[facility] Progress Note  Lydia Vasquez 59 y.o. 12-06-1960  CC: Persistently rising WBCs  Subjective: Reports lower abdominal pain at Lovenox injection sites.  Also reports some RUQ discomfort.  Denies any nausea or vomiting.  She is tolerating a diet.  ROS : Review of Systems  Cardiovascular: Negative for chest pain and palpitations.  Gastrointestinal: Positive for abdominal pain. Negative for blood in stool, constipation, diarrhea, heartburn, melena, nausea and vomiting.   Objective: Vital signs in last 24 hours: Vitals:   11/08/19 0027 11/08/19 0447  BP: (!) 98/59 111/69  Pulse: 84 90  Resp:  18  Temp:  98.5 F (36.9 C)  SpO2:  93%    Physical Exam:  General:  Alert, oriented, cooperative, no distress, appears stated age  Head:  Normocephalic, without obvious abnormality, atraumatic  Eyes:  Deep icterus, EOMs intact  Lungs:   Clear to auscultation bilaterally, respirations unlabored  Heart:  Regular rate and rhythm, S1, S2 normal  Abdomen:   Soft and mildly distended with mild diffuse tenderness, bowel sounds active all four quadrants, no guarding or peritoneal signs  Extremities: Extremities normal, atraumatic, no  edema  Pulses: 2+ and symmetric    Lab Results: Recent Labs    11/07/19 0447 11/08/19 0425  NA 138 136  K 3.7 3.2*  CL 93* 93*  CO2 30 28  GLUCOSE 128* 96  BUN 22* 25*  CREATININE 0.86 0.74  CALCIUM 9.0 8.8*  MG 2.2 2.2  PHOS 4.5 5.0*   Recent Labs    11/07/19 0447 11/08/19 0425  AST 159* 157*  ALT 28 33  ALKPHOS 195* 181*  BILITOT 14.9* 13.9*  PROT 7.8 7.6  ALBUMIN 2.5* 2.4*   Recent Labs    11/07/19 0447 11/08/19 0425  WBC 19.3* 18.2*  NEUTROABS 15.9* 14.4*  HGB 10.3* 9.8*  HCT 30.1* 28.0*  MCV 102.4* 99.6  PLT 399 403*   No results for input(s): LABPROT, INR in the last 72 hours.    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  Features suggestive of overlapping sepsis. -NO ascitic fluid on [place] yesterday -WBCs 18.2, mildly decreased from 19.2 yesterday, most likely related to alcoholic hepatitis -Transaminitis, most likely from alcoholic hepatitis and active steatohepatitis.  T bili mildly decreased today to 13.9 as compared to 14.9 yesterday. -INR elevated to 1.5 with PT 17.5 as of 11/03/2019 -Acute hepatitis panel negative 10/25/19 -ANA negative 10/8, AMA and ASMA negative 10/9 -Ceruloplasmin normal, no alpha 1 antitrypsin deficiency -Normal ferritin 10/9  Incidental cholelithiasis, gallbladder wall thickening. Cholecystitis vs hypoalbuminemia, albumin 2.0.   Plan: Patient's leukocytosis and transaminitis are most likely from alcoholic hepatitis and active steatohepatitis.    Complete alcohol cessation.  OK to discharge from a GI standpoint.  Follow-up with Dr. Marca Ancona has been arranged on 11/19.  [facility] will sign off. Thank you for the consultation. Please contact us if we can be of any further assistance during this hospital stay.  Edrick Kins PA-C 11/08/2019, 10:03 AM  Contact #  [phone]

## 2019-11-21 DIAGNOSIS — Z419 Encounter for procedure for purposes other than remedying health state, unspecified: Secondary | ICD-10-CM | POA: Diagnosis not present

## 2019-11-22 DIAGNOSIS — K7460 Unspecified cirrhosis of liver: Secondary | ICD-10-CM | POA: Diagnosis not present

## 2019-11-29 ENCOUNTER — Emergency Department (HOSPITAL_COMMUNITY)
Admission: EM | Admit: 2019-11-29 | Discharge: 2019-11-29 | Disposition: A | Discharge status: Home / Self Care | Payer: Medicaid Other | Attending: Emergency Medicine | Admitting: Emergency Medicine

## 2019-11-29 ENCOUNTER — Encounter (HOSPITAL_COMMUNITY): Payer: Self-pay | Admitting: *Deleted

## 2019-11-29 ENCOUNTER — Other Ambulatory Visit: Payer: Self-pay

## 2019-11-29 DIAGNOSIS — I10 Essential (primary) hypertension: Secondary | ICD-10-CM | POA: Diagnosis not present

## 2019-11-29 DIAGNOSIS — R202 Paresthesia of skin: Secondary | ICD-10-CM | POA: Diagnosis not present

## 2019-11-29 DIAGNOSIS — R200 Anesthesia of skin: Secondary | ICD-10-CM

## 2019-11-29 DIAGNOSIS — Z79899 Other long term (current) drug therapy: Secondary | ICD-10-CM | POA: Insufficient documentation

## 2019-11-29 LAB — ETHANOL: Alcohol, Ethyl (B): <10 mg/dL (ref <10)

## 2019-11-29 LAB — COMPREHENSIVE METABOLIC PANEL
ALT: 36 U/L (ref 0–44)
AST: 151 U/L — ABNORMAL HIGH (ref 15–41)
Albumin: 2.6 g/dL — ABNORMAL LOW (ref 3.5–5.0)
Alkaline Phosphatase: 201 U/L — ABNORMAL HIGH (ref 38–126)
Anion gap: 10 (ref 5–15)
BUN: 10 mg/dL (ref 6–20)
CO2: 29 mmol/L (ref 22–32)
Calcium: 9.1 mg/dL (ref 8.9–10.3)
Chloride: 97 mmol/L — ABNORMAL LOW (ref 98–111)
Creatinine, Ser: 0.95 mg/dL (ref 0.44–1.00)
GFR, Estimated: >60 mL/min (ref >60)
Glucose, Bld: 103 mg/dL — ABNORMAL HIGH (ref 70–99)
Potassium: 3.9 mmol/L (ref 3.5–5.1)
Sodium: 136 mmol/L (ref 135–145)
Total Bilirubin: 5.9 mg/dL — ABNORMAL HIGH (ref 0.3–1.2)
Total Protein: 8.5 g/dL — ABNORMAL HIGH (ref 6.5–8.1)

## 2019-11-29 LAB — CBC WITH DIFFERENTIAL/PLATELET
Abs Immature Granulocytes: 0.04 10*3/uL (ref 0.00–0.07)
Basophils Absolute: 0.1 10*3/uL (ref 0.0–0.1)
Basophils Relative: 1 %
Eosinophils Absolute: 0.2 10*3/uL (ref 0.0–0.5)
Eosinophils Relative: 1 %
HCT: 35.8 % — ABNORMAL LOW (ref 36.0–46.0)
Hemoglobin: 11.3 g/dL — ABNORMAL LOW (ref 12.0–15.0)
Immature Granulocytes: 0 %
Lymphocytes Relative: 19 %
Lymphs Abs: 2.2 10*3/uL (ref 0.7–4.0)
MCH: 32.9 pg (ref 26.0–34.0)
MCHC: 31.6 g/dL (ref 30.0–36.0)
MCV: 104.4 fL — ABNORMAL HIGH (ref 80.0–100.0)
Monocytes Absolute: 0.6 10*3/uL (ref 0.1–1.0)
Monocytes Relative: 5 %
Neutro Abs: 8.2 10*3/uL — ABNORMAL HIGH (ref 1.7–7.7)
Neutrophils Relative %: 74 %
Platelets: 365 10*3/uL (ref 150–400)
RBC: 3.43 MIL/uL — ABNORMAL LOW (ref 3.87–5.11)
RDW: 13.3 % (ref 11.5–15.5)
WBC: 11.2 10*3/uL — ABNORMAL HIGH (ref 4.0–10.5)
nRBC: 0 % (ref 0.0–0.2)

## 2019-11-29 LAB — BRAIN NATRIURETIC PEPTIDE: B Natriuretic Peptide: 96.7 pg/mL (ref 0.0–100.0)

## 2019-11-29 LAB — LIPASE, BLOOD: Lipase: 43 U/L (ref 11–51)

## 2019-11-29 LAB — MAGNESIUM: Magnesium: 1.9 mg/dL (ref 1.7–2.4)

## 2019-11-29 LAB — LACTIC ACID, PLASMA
Lactic Acid, Venous: 1.7 mmol/L (ref 0.5–1.9)
Lactic Acid, Venous: 1.3 mmol/L (ref 0.5–1.9)

## 2019-11-29 MED ORDER — NEPHRO-VITE RX 1 MG PO TABS: 1.0000 | ORAL_TABLET | Freq: Every day | ORAL | 2 refills | Status: DC

## 2019-11-29 NOTE — ED Triage Notes (Addendum)
Pt states she had a Liver biopsy in Oct, since then she has had increased numbness in her feet and legs. Also, reports nose bleeds. Throughout triage she list complaints of abd pain, shob when walking, also weight loss.

## 2019-11-29 NOTE — Discharge Instructions (Signed)
As discussed, your evaluation today has been largely reassuring.  But, it is important that you monitor your condition carefully, and do not hesitate to return to the ED if you develop new, or concerning changes in your condition. ? ?Otherwise, please follow-up with your physician for appropriate ongoing care. ? ?

## 2019-11-29 NOTE — ED Provider Notes (Signed)
[facility]-EMERGENCY DEPT Provider Note   CSN: [id number] Arrival date & time: 11/29/19  0746     History Chief Complaint  Patient presents with   Numbness    Lydia Vasquez is a 59 y.o. female.  HPI Adult female with recent hospitalization for sepsis during which she had a liver biopsy due to cirrhosis, alcohol abuse now complains of bilateral lower extremity loss of sensation.  She notes that since about the biopsy which was 2 weeks ago she has had a diminished sensation on the dorsum of both feet extending to the posterior of each knee.  Sensation interferes with ambulation, though she is able to do so.  No falling.  She has preserved sensation on the plantar surfaces of each leg, has no other new numbness.  On review of systems questioning she does add answer in the affirmative to multiple questions, but it seems as though these are the most notable recent changes.  Patient states that she stopped drinking alcohol after the hospitalization.  She has not yet followed up with her physician though she is scheduled to follow-up with both cardiology and gastroenterology in the next 2 weeks. No interval fever, vomiting, appreciable chest pain, abdominal pain.   Past Medical History:  Diagnosis Date   Anxiety    Depression    Hyperlipidemia    Hypertension     Patient Active Problem List   Diagnosis Date Noted   Sepsis due to urinary tract infection (HCC) 10/25/2019   Hypokalemia 10/25/2019   Sepsis (HCC) 10/25/2019   UTI (urinary tract infection) 10/25/2019    Past Surgical History:  Procedure Laterality Date   ABDOMINAL HYSTERECTOMY     CESAREAN SECTION       OB History   No obstetric history on file.     No family history on file.  Social History   Tobacco Use   Smoking status: Never Smoker   Smokeless tobacco: Never Used  Substance Use Topics   Alcohol use: Never   Drug use: Never    Home Medications Prior to Admission  medications   Medication Sig Start Date End Date Taking? Authorizing Provider  famotidine (PEPCID) 20 MG tablet Take 1 tablet (20 mg total) by mouth 2 (two) times daily. 11/08/19  Yes Drema Dallas, MD  furosemide (LASIX) 80 MG tablet Take 1 tablet (80 mg total) by mouth in the morning, at noon, and at bedtime. 11/08/19  Yes Drema Dallas, MD  metoprolol tartrate (LOPRESSOR) 25 MG tablet Take 1 tablet (25 mg total) by mouth 2 (two) times daily. 11/08/19  Yes Drema Dallas, MD  ondansetron (ZOFRAN) 4 MG tablet Take 1 tablet (4 mg total) by mouth every 6 (six) hours as needed for nausea. 11/08/19  Yes Drema Dallas, MD  potassium chloride SA (KLOR-CON) 20 MEQ tablet Take 2 tablets (40 mEq total) by mouth daily. 11/09/19  Yes Drema Dallas, MD    Allergies    Patient has no known allergies.  Review of Systems   Review of Systems  Constitutional:       Per HPI, otherwise negative  HENT:       Per HPI, otherwise negative  Respiratory:       Per HPI, otherwise negative  Cardiovascular:       Per HPI, otherwise negative  Gastrointestinal: Negative for vomiting.  Endocrine:       Negative aside from HPI  Genitourinary:       Neg aside from  HPI   Musculoskeletal:       Per HPI, otherwise negative  Skin: Negative.   Neurological: Positive for numbness. Negative for syncope.    Physical Exam Updated Vital Signs BP 104/82    Pulse 88    Temp 98.4 F (36.9 C) (Oral)    Resp 13    Ht 5\' 11"  (1.803 m)    Wt 108.9 kg    SpO2 96%    BMI 33.47 kg/m   Physical Exam Vitals and nursing note reviewed.  Constitutional:      General: She is not in acute distress.    Appearance: She is well-developed.  HENT:     Head: Normocephalic and atraumatic.  Eyes:     Conjunctiva/sclera: Conjunctivae normal.  Cardiovascular:     Rate and Rhythm: Normal rate and regular rhythm.  Pulmonary:     Effort: Pulmonary effort is normal. No respiratory distress.     Breath sounds: Normal breath  sounds. No stridor.  Abdominal:     General: There is no distension.    Skin:    General: Skin is warm and dry.  Neurological:     Mental Status: She is alert and oriented to person, place, and time.     Cranial Nerves: No cranial nerve deficit.     Comments: Preserved distal plantar sensation and reflexes in both lower extremities.  Patient notes no sensation on the dorsal surface of either leg, though has preserved sensation proximally.  Cranial nerves unremarkable.     ED Results / Procedures / Treatments   Labs (all labs ordered are listed, but only abnormal results are displayed) Labs Reviewed  COMPREHENSIVE METABOLIC PANEL - Abnormal; Notable for the following components:      Result Value   Chloride 97 (*)    Glucose, Bld 103 (*)    Total Protein 8.5 (*)    Albumin 2.6 (*)    AST 151 (*)    Alkaline Phosphatase 201 (*)    Total Bilirubin 5.9 (*)    All other components within normal limits  CBC WITH DIFFERENTIAL/PLATELET - Abnormal; Notable for the following components:   WBC 11.2 (*)    RBC 3.43 (*)    Hemoglobin 11.3 (*)    HCT 35.8 (*)    MCV 104.4 (*)    Neutro Abs 8.2 (*)    All other components within normal limits  ETHANOL  BRAIN NATRIURETIC PEPTIDE  LACTIC ACID, PLASMA  MAGNESIUM  LIPASE, BLOOD  LACTIC ACID, PLASMA    Procedures Procedures (including critical care time)   ED Course  I have reviewed the triage vital signs and the nursing notes.  Pertinent labs & imaging results that were available during my care of the patient were reviewed by me and considered in my medical decision making (see chart for details).   After initial evaluation with consideration of post hospitalization sequelae including electrolyte abnormalities, progression of cirrhosis, ongoing alcohol intoxication, malnutrition, broad differential considered, labs ordered.  Reviewing the patient's chart includes documentation of discharge diagnoses as below: Discharge Diagnoses:    Principal Problem:   Sepsis due to urinary tract infection (HCC) Active Problems:   Hypokalemia   Sepsis (HCC)   UTI (urinary tract infection)  MDM Rules/Calculators/A&P                          1:16 PM Patient in no distress, calm.  On we discussed today's findings which are generally reassuring.  On labs have improved from most recent hospitalization, with bilirubin substantially better, electrolytes better.  Vital signs remain unremarkable.  We discussed possibilities for her numbness, and there is some suspicion for vitamin deficiency secondary to alcohol abuse. Patient is not currently taking any thiamine or folic acid, will be started both of these supplements, given otherwise reassuring findings, no evidence for sepsis, decompensation, and with general improvement compared to recent hospitalization patient is appropriate for discharge with ongoing outpatient therapy. Final Clinical Impression(s) / ED Diagnoses Final diagnoses:  Numbness   MDM Number of Diagnoses or Management Options Numbness: established, worsening   Amount and/or Complexity of Data Reviewed Clinical lab tests: reviewed Tests in the medicine section of CPT: reviewed Decide to obtain previous medical records or to obtain history from someone other than the patient: yes Review and summarize past medical records: yes  Risk of Complications, Morbidity, and/or Mortality Presenting problems: high Diagnostic procedures: high Management options: high  Critical Care Total time providing critical care: < 30 minutes  Patient Progress Patient progress: stable  Rx / DC Orders ED Discharge Orders         Ordered    B Complex-C-Folic Acid (B COMPLEX-VITAMIN C-FOLIC ACID) 1 MG tablet  Daily with breakfast        11/29/19 1317           Gerhard Munch, MD 11/29/19 1318

## 2019-12-21 DIAGNOSIS — Z419 Encounter for procedure for purposes other than remedying health state, unspecified: Secondary | ICD-10-CM | POA: Diagnosis not present

## 2020-01-21 DIAGNOSIS — Z419 Encounter for procedure for purposes other than remedying health state, unspecified: Secondary | ICD-10-CM | POA: Diagnosis not present

## 2020-02-21 DIAGNOSIS — Z419 Encounter for procedure for purposes other than remedying health state, unspecified: Secondary | ICD-10-CM | POA: Diagnosis not present

## 2020-03-20 DIAGNOSIS — Z419 Encounter for procedure for purposes other than remedying health state, unspecified: Secondary | ICD-10-CM | POA: Diagnosis not present

## 2020-04-20 DIAGNOSIS — Z419 Encounter for procedure for purposes other than remedying health state, unspecified: Secondary | ICD-10-CM | POA: Diagnosis not present

## 2020-05-20 DIAGNOSIS — Z419 Encounter for procedure for purposes other than remedying health state, unspecified: Secondary | ICD-10-CM | POA: Diagnosis not present

## 2020-06-10 NOTE — Progress Notes (Signed)
Subjective:    Patient ID: Lydia Vasquez, female    DOB: Sep 07, 1960, 60 y.o.   MRN: [id number]  60 y.o.F here to est PCP  06/11/20 This a 60 year old female seen today to establish for primary care.  This patient previously was living in [place] came to [place] 2 years ago to help care for her mother who is quite elderly.  The patient subsequent was hospitalized in October with urosepsis with blood in the urine precipitated by alcohol use as well and volume depletion.  Below is Documentation of this admission.  During the admission she did have documented ventricular tachycardia and was recommended she have follow-up with cardiology but this never occurred.  Also she had hepatic steatosis and liver cirrhosis on liver biopsy recommended to follow-up with gastroenterology this never occurred either.  Note patient now has full Medicaid. Admit date: 10/25/2019 Discharge date: 11/08/2019  Time spent: 35 minutes  Recommendations for Outpatient Follow-up:   Covid vaccination; no vaccination  Ventricular Tachycardia:  -Echo with EF 60-65% (see report) -10/13 metoprolol 25 mg BID -Resolved -10/14 cardiology signed off -Strict in and out +6.6 L -Daily weight Filed Weights  11/06/19 0737 11/07/19 0500 11/08/19 0447 Weight: 113.4 kg 111.9 kg 111.3 kg -Follow-up with cardiology  in 2 weeks with EP Dr. Cristopher Peru, per [facility] MG recommendation secondary to ventricular tachycardia during hospitalization. Consider cardiac MRI  Essential HTN -See ventricular tachycardia  Direct Hyperbilirubinemia  Jaundice  Hepatic Steatosis/EtOH hepatitis: -10/18 elevated bili and alk phos, mildly elevated ASTbeginning to trend up. -RUQ [place] with cholelithiasis without findings c/w cholecystitis -Follow monospot - negative -Follow MRCP - notable for hepatomegaly and diffuse hepatic steatosis. No obstructive cause for jaundice identified. Gallbladder wall thickening (up to 0.6 cm) with  multiple small gallstones. Traces ascites.  -A1 antitrypsin elevated -ANA negative -Antimitochondrial antibody negative -Ceruloplasmin WNL -10/11 liver biopsy; consistent with liver cirrhosis see results below -Would consider Prednisolone([place] Discriminant Function 39.8, it drops to 26 when using upper limit of normal for our lab 15 instead of 12), will defer to GI-> planning to f/u in 4-6 weeks per GI. -10/18 with patient's liver enzymes beginning to trend up and leukocytosis held patient's discharge. Contacted [facility] spoke with Dr. Alessandra Bevels, who stated Dr. Marcelyn Ditty round on patient today. Spoke with PA Allisonfrom [facility] they are going to discuss plan of care, will await further recommendations -[facility] evaluated patient, and has scheduled follow-up appointment with Dr. Therisa Doyne on 11/19.  Liver cirrhosis stage IV -Stage IV/IV see results for core biopsy below  Bilateral LE edema:  -Hypotension with fluid overload. -10/14 albumin 25 g + Lasix IV 60 mg BID -10/15 Albumin 50 g; increase Lasix IV 60 mg TID -Most likely related to cirrhosis with volume overload and height follow-up anemia -[place] negative for DVT -See ventricular tachycardia -Limit fluid intake to 1 L/day. -Lasix p.o. 80 mg TID; Lasix has been working well for patient WILL NOT add spironolactone at this time.  Will defer to cardiology or GI if they wish to add at her follow-up appointment.  Severe sepsis positiveE. coli UTI -Met guidelines for sepsis on admission HR >90, RR>20, sign of infection urinary tract.>2 -Present on admission. -Urine cx with e. Coli sensitive to ancef, completed 7 days of antibiotics -Follow blood cultures - NGTD -Sepsis physiology improved  Hypokalemia -Potassium goal >4  -10/19 K-Dur 50 mEq prior to discharge -K-Dur 40 mEq QID  Hypomagnesmia -Magnesium goal> 2  Hyperlipidemia with hepatic steatosis: -Elevated  triglycerides, LDL not calculated. HDL <10.  Continue to follow.  History of cervical cancer status post total hysterectomy: -In 2012. CT scan with no evidence of recurrent cancer or obstruction.  Abnormal CXR: -Repeat 10/7 with persistent bibasilar atelectasis/infiltrates. Small L pleural effusion, possible. Continue to monitor.  Leukocytosis/Elevated liver enzymes -GI feels that elevation secondary to EtOH hepatitis and active steatohepatitis.  -10/19 GI has signed off and states patient is safe for discharge.    Goals of care -Per PT, no PT follow-up required   Discharge Diagnoses:  Principal Problem:   Sepsis due to urinary tract infection ([place]) Active Problems:   Hypokalemia   Sepsis ([place])   UTI (urinary tract infection)  Patient continues to complain of blood in the urine most recently as of the last several days.  She has difficulty starting her stream.  Patient also has difficulty with major depression and is having difficulty with adjustment to her mother's illness and her own conditions.  She has quit drinking alcohol at this time.  She notes her abdominal pain and swelling has improved somewhat.  Past Medical History:  Diagnosis Date  . Anxiety   . Depression   . Hyperlipidemia   . Hypertension      History reviewed. No pertinent family history.   Social History   Socioeconomic History  . Marital status: Single    Spouse name: Not on file  . Number of children: Not on file  . Years of education: Not on file  . Highest education level: Not on file  Occupational History  . Not on file  Tobacco Use  . Smoking status: Never Smoker  . Smokeless tobacco: Never Used  Substance and Sexual Activity  . Alcohol use: Never  . Drug use: Never  . Sexual activity: Not on file  Other Topics Concern  . Not on file  Social History Narrative  . Not on file   Social Determinants of Health   Financial Resource Strain: Not on file  Food Insecurity: Not on file  Transportation Needs: Not on file   Physical Activity: Not on file  Stress: Not on file  Social Connections: Not on file  Intimate Partner Violence: Not on file     No Known Allergies   Outpatient Medications Prior to Visit  Medication Sig Dispense Refill  . B Complex-C-Folic Acid (B COMPLEX-VITAMIN C-FOLIC ACID) 1 MG tablet Take 1 tablet by mouth daily with breakfast. (Patient not taking: Reported on 06/11/2020) 30 tablet 2  . famotidine (PEPCID) 20 MG tablet Take 1 tablet (20 mg total) by mouth 2 (two) times daily. (Patient not taking: Reported on 06/11/2020) 60 tablet 0  . furosemide (LASIX) 80 MG tablet Take 1 tablet (80 mg total) by mouth in the morning, at noon, and at bedtime. (Patient not taking: Reported on 06/11/2020) 90 tablet 0  . metoprolol tartrate (LOPRESSOR) 25 MG tablet Take 1 tablet (25 mg total) by mouth 2 (two) times daily. (Patient not taking: Reported on 06/11/2020) 60 tablet 0  . ondansetron (ZOFRAN) 4 MG tablet Take 1 tablet (4 mg total) by mouth every 6 (six) hours as needed for nausea. (Patient not taking: Reported on 06/11/2020) 20 tablet 0  . potassium chloride SA (KLOR-CON) 20 MEQ tablet Take 2 tablets (40 mEq total) by mouth daily. (Patient not taking: Reported on 06/11/2020) 30 tablet 0   No facility-administered medications prior to visit.      Review of Systems  Constitutional: Positive for appetite change and fatigue.  HENT:  Positive for voice change. Negative for ear discharge, ear pain, postnasal drip and rhinorrhea.   Respiratory: Positive for shortness of breath.   Gastrointestinal: Positive for abdominal distention and abdominal pain. Negative for anal bleeding, blood in stool, constipation, diarrhea, nausea, rectal pain and vomiting.  Genitourinary: Positive for difficulty urinating, flank pain and hematuria. Negative for dysuria.  Psychiatric/Behavioral: Positive for dysphoric mood. Negative for decreased concentration, self-injury and suicidal ideas. The patient is nervous/anxious.         Objective:   Physical Exam Vitals:   06/11/20 0954  BP: 137/82  Pulse: 89  SpO2: 95%  Weight: 253 lb 12.8 oz (115.1 kg)  Height: '5\' 11"'  (1.803 m)    Gen: Pleasant,obese, in no distress,  tearful affect  ENT: No lesions,  mouth clear,  oropharynx clear, no postnasal drip  Neck: No JVD, no TMG, no carotid bruits  Lungs: No use of accessory muscles, no dullness to percussion, clear without rales or rhonchi  Cardiovascular: RRR, heart sounds normal, no murmur or gallops, no peripheral edema  Abdomen: soft and NT, no HSM,  BS normal  Musculoskeletal: No deformities, no cyanosis or clubbing  Neuro: alert, non focal  Skin: Warm, no lesions or rashes  PHQ9 SCORE ONLY 06/11/2020  PHQ-9 Total Score 23   BMP Latest Ref Rng & Units 11/29/2019 11/08/2019 11/07/2019  Glucose 70 - 99 mg/dL 103(H) 96 128(H)  BUN 6 - 20 mg/dL 10 25(H) 22(H)  Creatinine 0.44 - 1.00 mg/dL 0.95 0.74 0.86  Sodium 135 - 145 mmol/L 136 136 138  Potassium 3.5 - 5.1 mmol/L 3.9 3.2(L) 3.7  Chloride 98 - 111 mmol/L 97(L) 93(L) 93(L)  CO2 22 - 32 mmol/L '29 28 30  ' Calcium 8.9 - 10.3 mg/dL 9.1 8.8(L) 9.0   Hepatic Function Latest Ref Rng & Units 11/29/2019 11/08/2019 11/07/2019  Total Protein 6.5 - 8.1 g/dL 8.5(H) 7.6 7.8  Albumin 3.5 - 5.0 g/dL 2.6(L) 2.4(L) 2.5(L)  AST 15 - 41 U/L 151(H) 157(H) 159(H)  ALT 0 - 44 U/L 36 33 28  Alk Phosphatase 38 - 126 U/L 201(H) 181(H) 195(H)  Total Bilirubin 0.3 - 1.2 mg/dL 5.9(H) 13.9(H) 14.9(H)  Bilirubin, Direct 0.0 - 0.2 mg/dL - - -   CBC Latest Ref Rng & Units 11/29/2019 11/08/2019 11/07/2019  WBC 4.0 - 10.5 K/uL 11.2(H) 18.2(H) 19.3(H)  Hemoglobin 12.0 - 15.0 g/dL 11.3(L) 9.8(L) 10.3(L)  Hematocrit 36.0 - 46.0 % 35.8(L) 28.0(L) 30.1(L)  Platelets 150 - 400 K/uL 365 403(H) 399          Assessment & Plan:  I personally reviewed all images and lab data in the [facility] system as well as any outside material available during this office visit and agree with the   radiology impressions.   UTI (urinary tract infection) Suspect recurrent urinary tract infection  Plan obtain urinalysis and urine culture  Antibiotics once results available  Given history of cervical cancer will refer to urology since patient has ongoing hematuria  Hypokalemia Follow-up metabolic panel to assess potassium levels  Ventricular tachycardia (HCC) History of ventricular tachycardia we will resume metoprolol 25 mg twice daily and obtain cardiology consultation  Alcoholic cirrhosis of liver without ascites ([place]) Alcoholic liver cirrhosis without ascites and steatosis  Referral to gastroenterology made  Patient is abstinent from alcohol use  Hypomagnesemia Recheck magnesium levels  Major depression, recurrent, chronic (HCC) We will begin sertraline 50 mg daily and get this patient connected to our licensed clinical social worker  History of alcohol  abuse History of alcohol abuse currently in remission but high risk for recurrence  Connect with mental health  BMI 35.0-35.9,adult Elevated BMI patient given dietary and exercise regimen  History of cervical cancer History of cervical cancer status post complete hysterectomy  Recent CT scan of the abdomen negative for recurrence   [facility] was seen today for new patient (initial visit).  Diagnoses and all orders for this visit:  Alcoholic cirrhosis of liver without ascites ([place]) -     Ambulatory referral to Gastroenterology  Acute cystitis with hematuria -     Ambulatory referral to Urology -     Urinalysis -     Urine Culture -     CBC with Differential/Platelet  Ventricular tachycardia ([place]) -     Ambulatory referral to Cardiology -     Comprehensive metabolic panel  Encounter for screening mammogram for malignant neoplasm of breast -     MM DIGITAL SCREENING BILATERAL; Future  Hypokalemia -     Comprehensive metabolic panel  Hypomagnesemia -     Magnesium  Need for hepatitis C screening  test -     HCV Ab w Reflex to Quant PCR  Major depression, recurrent, chronic (HCC)  History of alcohol abuse  BMI 35.0-35.9,adult  History of cervical cancer  Other orders -     B Complex-C-Folic Acid (B COMPLEX-VITAMIN C-FOLIC ACID) 1 MG tablet; Take 1 tablet by mouth daily with breakfast. -     famotidine (PEPCID) 20 MG tablet; Take 1 tablet (20 mg total) by mouth 2 (two) times daily. -     metoprolol tartrate (LOPRESSOR) 25 MG tablet; Take 1 tablet (25 mg total) by mouth 2 (two) times daily. -     sertraline (ZOLOFT) 50 MG tablet; Take 1 tablet (50 mg total) by mouth daily.   I spent 46 minutes evaluating this patient reviewing prior discharge summary, reviewing all records and [employer] link examining the patient highly complex medical decision making also spent in multiple referrals issued

## 2020-06-11 ENCOUNTER — Ambulatory Visit: Payer: Medicaid Other | Attending: Critical Care Medicine | Admitting: Critical Care Medicine

## 2020-06-11 ENCOUNTER — Other Ambulatory Visit: Payer: Self-pay

## 2020-06-11 ENCOUNTER — Encounter: Payer: Self-pay | Admitting: Critical Care Medicine

## 2020-06-11 ENCOUNTER — Encounter (INDEPENDENT_AMBULATORY_CARE_PROVIDER_SITE_OTHER): Payer: Self-pay

## 2020-06-11 VITALS — BP 137/82 | HR 89 | Ht 71.0 in | Wt 253.8 lb

## 2020-06-11 DIAGNOSIS — E876 Hypokalemia: Secondary | ICD-10-CM

## 2020-06-11 DIAGNOSIS — K7030 Alcoholic cirrhosis of liver without ascites: Secondary | ICD-10-CM | POA: Diagnosis not present

## 2020-06-11 DIAGNOSIS — I472 Ventricular tachycardia, unspecified: Secondary | ICD-10-CM

## 2020-06-11 DIAGNOSIS — Z1159 Encounter for screening for other viral diseases: Secondary | ICD-10-CM | POA: Diagnosis not present

## 2020-06-11 DIAGNOSIS — Z6841 Body Mass Index (BMI) 40.0 and over, adult: Secondary | ICD-10-CM | POA: Insufficient documentation

## 2020-06-11 DIAGNOSIS — F339 Major depressive disorder, recurrent, unspecified: Secondary | ICD-10-CM

## 2020-06-11 DIAGNOSIS — Z8541 Personal history of malignant neoplasm of cervix uteri: Secondary | ICD-10-CM

## 2020-06-11 DIAGNOSIS — Z6835 Body mass index (BMI) 35.0-35.9, adult: Secondary | ICD-10-CM

## 2020-06-11 DIAGNOSIS — N3001 Acute cystitis with hematuria: Secondary | ICD-10-CM | POA: Diagnosis not present

## 2020-06-11 DIAGNOSIS — F1011 Alcohol abuse, in remission: Secondary | ICD-10-CM | POA: Diagnosis not present

## 2020-06-11 DIAGNOSIS — Z1231 Encounter for screening mammogram for malignant neoplasm of breast: Secondary | ICD-10-CM | POA: Diagnosis not present

## 2020-06-11 HISTORY — DX: Ventricular tachycardia, unspecified: I47.20

## 2020-06-11 MED ORDER — SERTRALINE HCL 50 MG PO TABS: 50.0000 mg | ORAL_TABLET | Freq: Every day | ORAL | 3 refills | Status: DC

## 2020-06-11 MED ORDER — NEPHRO-VITE RX 1 MG PO TABS
1.0000 | ORAL_TABLET | Freq: Every day | ORAL | 2 refills | Status: DC
Start: 2020-06-11 — End: 2022-06-18

## 2020-06-11 MED ORDER — FAMOTIDINE 20 MG PO TABS
20.0000 mg | ORAL_TABLET | Freq: Two times a day (BID) | ORAL | 3 refills | Status: DC
Start: 2020-06-11 — End: 2020-10-14

## 2020-06-11 MED ORDER — METOPROLOL TARTRATE 25 MG PO TABS
25.0000 mg | ORAL_TABLET | Freq: Two times a day (BID) | ORAL | 3 refills | Status: DC
Start: 2020-06-11 — End: 2020-10-14

## 2020-06-11 NOTE — Assessment & Plan Note (Signed)
Follow-up metabolic panel to assess potassium levels

## 2020-06-11 NOTE — Assessment & Plan Note (Signed)
History of ventricular tachycardia we will resume metoprolol 25 mg twice daily and obtain cardiology consultation

## 2020-06-11 NOTE — Assessment & Plan Note (Signed)
History of alcohol abuse currently in remission but high risk for recurrence  Connect with mental health

## 2020-06-11 NOTE — Assessment & Plan Note (Signed)
Elevated BMI patient given dietary and exercise regimen

## 2020-06-11 NOTE — Assessment & Plan Note (Signed)
Alcoholic liver cirrhosis without ascites and steatosis  Referral to gastroenterology made  Patient is abstinent from alcohol use

## 2020-06-11 NOTE — Patient Instructions (Signed)
A referral to gastroenterology will be made to assess your liver A referral to cardiology will be made because of your history of palpitations and you will be restarted on metoprolol 1 pill twice daily for your palpitations and heart rate, this was sent to your [facility] pharmacy  A referral to urology is made for recurrent urinary tract infections  Labs today include metabolic profile blood counts urine studies hepatitis C assays  Refill on Pepcid sent to the pharmacy resume your vitamin B complex vitamin and resume metoprolol 1 pill twice daily  Urine culture is sent once results are available and antibiotic likely will be ordered  You are due for mammogram we will order this  You do need a colonoscopy but cannot hold off on this to we have you see the gastroenterologist for your liver condition  Continue to avoid all alcohol  I am starting sertraline 1 pill daily for depression and also get you in with [facility] our licensed clinical social worker for counseling  Follow a healthy diet as outlined below  Return to see Dr. Delford Field 2 months   Obesity, Adult Obesity is having too much body fat. Being obese means that your weight is more than what is healthy for you. BMI is a number that explains how much body fat you have. If you have a BMI of 30 or more, you are obese. Obesity is often caused by eating or drinking more calories than your body uses. Changing your lifestyle can help you lose weight. Obesity can cause serious health problems, such as:  Stroke.  Coronary artery disease (CAD).  Type 2 diabetes.  Some types of cancer, including cancers of the colon, breast, uterus, and gallbladder.  Osteoarthritis.  High blood pressure (hypertension).  High cholesterol.  Sleep apnea.  Gallbladder stones.  Infertility problems. What are the causes?  Eating meals each day that are high in calories, sugar, and fat.  Being born with genes that may make you more likely to become  obese.  Having a medical condition that causes obesity.  Taking certain medicines.  Sitting a lot (having a sedentary lifestyle).  Not getting enough sleep.  Drinking a lot of drinks that have sugar in them. What increases the risk?  Having a family history of obesity.  Being an [place] American woman.  Being a Hispanic man.  Living in an area with limited access to: ? [facility], recreation centers, or sidewalks. ? Healthy food choices, such as grocery stores and farmers' markets. What are the signs or symptoms? The main sign is having too much body fat. How is this treated?  Treatment for this condition often includes changing your lifestyle. Treatment may include: ? Changing your diet. This may include making a healthy meal plan. ? Exercise. This may include activity that causes your heart to beat faster (aerobic exercise) and strength training. Work with your doctor to design a program that works for you. ? Medicine to help you lose weight. This may be used if you are not able to lose 1 pound a week after 6 weeks of healthy eating and more exercise. ? Treating conditions that cause the obesity. ? Surgery. Options may include gastric banding and gastric bypass. This may be done if:  Other treatments have not helped to improve your condition.  You have a BMI of 40 or higher.  You have life-threatening health problems related to obesity. Follow these instructions at home: Eating and drinking  Follow advice from your doctor about what to eat  and drink. Your doctor may tell you to: ? Limit fast food, sweets, and processed snack foods. ? Choose low-fat options. For example, choose low-fat milk instead of whole milk. ? Eat 5 or more servings of fruits or vegetables each day. ? Eat at home more often. This gives you more control over what you eat. ? Choose healthy foods when you eat out. ? Learn to read food labels. This will help you learn how much food is in 1 serving. ? Keep  low-fat snacks available. ? Avoid drinks that have a lot of sugar in them. These include soda, fruit juice, iced tea with sugar, and flavored milk.  Drink enough water to keep your pee (urine) pale yellow.  Do not go on fad diets.   Physical activity  Exercise often, as told by your doctor. Most adults should get up to 150 minutes of moderate-intensity exercise every week.Ask your doctor: ? What types of exercise are safe for you. ? How often you should exercise.  Warm up and stretch before being active.  Do slow stretching after being active (cool down).  Rest between times of being active. Lifestyle  Work with your doctor and a food expert (dietitian) to set a weight-loss goal that is best for you.  Limit your screen time.  Find ways to reward yourself that do not involve food.  Do not drink alcohol if: ? Your doctor tells you not to drink. ? You are pregnant, may be pregnant, or are planning to become pregnant.  If you drink alcohol: ? Limit how much you use to:  0-1 drink a day for women.  0-2 drinks a day for men. ? Be aware of how much alcohol is in your drink. In the [place] one drink equals one 12 oz bottle of beer (355 mL), one 5 oz glass of wine (148 mL), or one 1 oz glass of hard liquor (44 mL). General instructions  Keep a weight-loss journal. This can help you keep track of: ? The food that you eat. ? How much exercise you get.  Take over-the-counter and prescription medicines only as told by your doctor.  Take vitamins and supplements only as told by your doctor.  Think about joining a support group.  Keep all follow-up visits as told by your doctor. This is important. Contact a doctor if:  You cannot meet your weight loss goal after you have changed your diet and lifestyle for 6 weeks. Get help right away if you:  Are having trouble breathing.  Are having thoughts of harming yourself. Summary  Obesity is having too much body fat.  Being obese  means that your weight is more than what is healthy for you.  Work with your doctor to set a weight-loss goal.  Get regular exercise as told by your doctor. This information is not intended to replace advice given to you by your health care provider. Make sure you discuss any questions you have with your health care provider. Document Revised: 09/10/2017 Document Reviewed: 09/10/2017 Elsevier Patient Education  2021 [employer].

## 2020-06-11 NOTE — Assessment & Plan Note (Signed)
Recheck magnesium levels

## 2020-06-11 NOTE — Assessment & Plan Note (Signed)
History of cervical cancer status post complete hysterectomy  Recent CT scan of the abdomen negative for recurrence

## 2020-06-11 NOTE — Assessment & Plan Note (Signed)
We will begin sertraline 50 mg daily and get this patient connected to our licensed clinical social worker

## 2020-06-11 NOTE — Progress Notes (Signed)
States she sees blood in her urine at times. Having numbness in legs down to feet.

## 2020-06-11 NOTE — Assessment & Plan Note (Addendum)
Suspect recurrent urinary tract infection  Plan obtain urinalysis and urine culture  Antibiotics once results available  Given history of cervical cancer will refer to urology since patient has ongoing hematuria

## 2020-06-12 LAB — CBC WITH DIFFERENTIAL/PLATELET
Basophils Absolute: 0 10*3/uL (ref 0.0–0.2)
Basos: 1 %
EOS (ABSOLUTE): 0.1 10*3/uL (ref 0.0–0.4)
Eos: 2 %
Hematocrit: 36.9 % (ref 34.0–46.6)
Hemoglobin: 11.8 g/dL (ref 11.1–15.9)
Immature Grans (Abs): 0 10*3/uL (ref 0.0–0.1)
Immature Granulocytes: 0 %
Lymphocytes Absolute: 1.7 10*3/uL (ref 0.7–3.1)
Lymphs: 38 %
MCH: 25.3 pg — ABNORMAL LOW (ref 26.6–33.0)
MCHC: 32 g/dL (ref 31.5–35.7)
MCV: 79 fL (ref 79–97)
Monocytes Absolute: 0.4 10*3/uL (ref 0.1–0.9)
Monocytes: 8 %
Neutrophils Absolute: 2.3 10*3/uL (ref 1.4–7.0)
Neutrophils: 51 %
Platelets: 267 10*3/uL (ref 150–450)
RBC: 4.66 x10E6/uL (ref 3.77–5.28)
RDW: 14.1 % (ref 11.7–15.4)
WBC: 4.5 10*3/uL (ref 3.4–10.8)

## 2020-06-12 LAB — URINALYSIS
Bilirubin, UA: NEGATIVE
Glucose, UA: NEGATIVE
Ketones, UA: NEGATIVE
Nitrite, UA: POSITIVE — AB
RBC, UA: NEGATIVE
Specific Gravity, UA: 1.021 (ref 1.005–1.030)
Urobilinogen, Ur: 1 mg/dL (ref 0.2–1.0)
pH, UA: 6 (ref 5.0–7.5)

## 2020-06-12 LAB — COMPREHENSIVE METABOLIC PANEL
ALT: 17 IU/L (ref 0–32)
AST: 40 IU/L (ref 0–40)
Albumin/Globulin Ratio: 1 — ABNORMAL LOW (ref 1.2–2.2)
Albumin: 4 g/dL (ref 3.8–4.9)
Alkaline Phosphatase: 195 IU/L — ABNORMAL HIGH (ref 44–121)
BUN/Creatinine Ratio: 20 (ref 9–23)
BUN: 12 mg/dL (ref 6–24)
Bilirubin Total: 0.9 mg/dL (ref 0.0–1.2)
CO2: 20 mmol/L (ref 20–29)
Calcium: 9.2 mg/dL (ref 8.7–10.2)
Chloride: 105 mmol/L (ref 96–106)
Creatinine, Ser: 0.59 mg/dL (ref 0.57–1.00)
Globulin, Total: 4.1 g/dL (ref 1.5–4.5)
Glucose: 100 mg/dL — ABNORMAL HIGH (ref 65–99)
Potassium: 3.7 mmol/L (ref 3.5–5.2)
Sodium: 141 mmol/L (ref 134–144)
Total Protein: 8.1 g/dL (ref 6.0–8.5)
eGFR: 104 mL/min/{1.73_m2} (ref >59)

## 2020-06-12 LAB — HCV AB W REFLEX TO QUANT PCR: HCV Ab: <0.1 s/co ratio (ref 0.0–0.9)

## 2020-06-12 LAB — MAGNESIUM: Magnesium: 1.6 mg/dL (ref 1.6–2.3)

## 2020-06-12 LAB — HCV INTERPRETATION

## 2020-06-14 ENCOUNTER — Other Ambulatory Visit: Payer: Self-pay | Admitting: Critical Care Medicine

## 2020-06-14 LAB — URINE CULTURE

## 2020-06-14 MED ORDER — CIPROFLOXACIN HCL 500 MG PO TABS: 500.0000 mg | ORAL_TABLET | Freq: Two times a day (BID) | ORAL | 0 refills | Status: DC

## 2020-06-15 ENCOUNTER — Other Ambulatory Visit: Payer: Self-pay | Admitting: Critical Care Medicine

## 2020-06-15 MED ORDER — SULFAMETHOXAZOLE-TRIMETHOPRIM 800-160 MG PO TABS: 1.0000 | ORAL_TABLET | Freq: Two times a day (BID) | ORAL | 0 refills | Status: DC

## 2020-06-20 DIAGNOSIS — Z419 Encounter for procedure for purposes other than remedying health state, unspecified: Secondary | ICD-10-CM | POA: Diagnosis not present

## 2020-06-27 ENCOUNTER — Telehealth: Payer: Self-pay | Admitting: Critical Care Medicine

## 2020-06-27 NOTE — Telephone Encounter (Signed)
I asked about medicaid and she did not know how to do that   Any help would be good  I really need her to see the cardiology appt Friday

## 2020-06-27 NOTE — Telephone Encounter (Signed)
This pt has appt with cardiology this Friday on [address] with dr Ladona Ridgel   She does not have any transporation and does not drive  Can we get [facility] to get her???

## 2020-06-28 ENCOUNTER — Telehealth: Payer: Self-pay

## 2020-06-28 NOTE — Telephone Encounter (Signed)
Call placed to patient to inquire if she contacted her insurance company about a ride to her appointment tomorrow.  She confirmed that she called [facility] transportation and they are picking her up tomorrow for her appointment.  Reminded her that she can call them to schedule transportation to future medical appointments.

## 2020-06-29 ENCOUNTER — Other Ambulatory Visit: Payer: Self-pay

## 2020-06-29 ENCOUNTER — Other Ambulatory Visit: Payer: Self-pay | Admitting: Internal Medicine

## 2020-06-29 ENCOUNTER — Ambulatory Visit (INDEPENDENT_AMBULATORY_CARE_PROVIDER_SITE_OTHER): Payer: Medicaid Other | Admitting: Internal Medicine

## 2020-06-29 ENCOUNTER — Encounter: Payer: Self-pay | Admitting: Internal Medicine

## 2020-06-29 ENCOUNTER — Ambulatory Visit (INDEPENDENT_AMBULATORY_CARE_PROVIDER_SITE_OTHER): Payer: Medicaid Other

## 2020-06-29 DIAGNOSIS — R002 Palpitations: Secondary | ICD-10-CM

## 2020-06-29 DIAGNOSIS — Z7689 Persons encountering health services in other specified circumstances: Secondary | ICD-10-CM | POA: Diagnosis not present

## 2020-06-29 NOTE — Progress Notes (Unsigned)
Enrolled patient for a 7 day Zio XT monitor to be mailed to patients home.  

## 2020-06-29 NOTE — Progress Notes (Signed)
HPI Lydia Vasquez is referred by Dr. Delford Field for evaluation of PVC's and NSVT. She is a pleasant 60 yo woman with morbid obesity. She was admitted for sepsis in October and found to have NSVT. She has done fairly well since her admit. She does have continued intermittent palpitations. She has a h/o remote ETOH abuse. She has a h/o ETOH cirrhosis. She has not had syncope. No Known Allergies   Current Outpatient Medications  Medication Sig Dispense Refill   B Complex-C-Folic Acid (B COMPLEX-VITAMIN C-FOLIC ACID) 1 MG tablet Take 1 tablet by mouth daily with breakfast. 30 tablet 2   famotidine (PEPCID) 20 MG tablet Take 1 tablet (20 mg total) by mouth 2 (two) times daily. 60 tablet 3   metoprolol tartrate (LOPRESSOR) 25 MG tablet Take 1 tablet (25 mg total) by mouth 2 (two) times daily. 60 tablet 3   sertraline (ZOLOFT) 50 MG tablet Take 1 tablet (50 mg total) by mouth daily. 30 tablet 3   sulfamethoxazole-trimethoprim (BACTRIM DS) 800-160 MG tablet Take 1 tablet by mouth 2 (two) times daily. 14 tablet 0   No current facility-administered medications for this visit.     Past Medical History:  Diagnosis Date   Anxiety    Depression    Hyperlipidemia    Hypertension     ROS:   All systems reviewed and negative except as noted in the HPI.   Past Surgical History:  Procedure Laterality Date   ABDOMINAL HYSTERECTOMY     CESAREAN SECTION       History reviewed. No pertinent family history.   Social History   Socioeconomic History   Marital status: Single    Spouse name: Not on file   Number of children: Not on file   Years of education: Not on file   Highest education level: Not on file  Occupational History   Not on file  Tobacco Use   Smoking status: Never   Smokeless tobacco: Never  Substance and Sexual Activity   Alcohol use: Never   Drug use: Never   Sexual activity: Not on file  Other Topics Concern   Not on file  Social History Narrative   Not on file    Social Determinants of Health   Financial Resource Strain: Not on file  Food Insecurity: Not on file  Transportation Needs: Not on file  Physical Activity: Not on file  Stress: Not on file  Social Connections: Not on file  Intimate Partner Violence: Not on file     BP 107/71   Pulse (!) 56   Ht 5\' 11"  (1.803 m)   Wt 253 lb (114.8 kg)   SpO2 94%   BMI 35.29 kg/m   Physical Exam:  Obese appearing NAD HEENT: Unremarkable Neck:  No JVD, no thyromegally Lymphatics:  No adenopathy Back:  No CVA tenderness Lungs:  Clear with no wheezes HEART:  Regular rate rhythm, no murmurs, no rubs, no clicks Abd:  soft, positive bowel sounds, no organomegally, no rebound, no guarding Ext:  2 plus pulses, no edema, no cyanosis, no clubbing Skin:  No rashes no nodules Neuro:  CN II through XII intact, motor grossly intact  EKG - nsr  Assess/Plan:  NSVT/PVC's - I do not have any ECG's of her ectopy. I will ask her to wear a 7 day Zio patch monitor.  HTN - her bp is fairly well controlled. Obesity - she will need to lose weight.  ETOH abuse - she is  encouraged to avoid ETOH.   Lydia Gowda Teasia Zapf,MD

## 2020-06-29 NOTE — Patient Instructions (Signed)
Medication Instructions:  Your physician recommends that you continue on your current medications as directed. Please refer to the Current Medication list given to you today.  Labwork: None ordered.  Testing/Procedures: Your physician has recommended that you wear a holter monitor. Holter monitors are medical devices that record the heart's electrical activity. Doctors most often use these monitors to diagnose arrhythmias. Arrhythmias are problems with the speed or rhythm of the heartbeat. The monitor is a small, portable device. You can wear one while you do your normal daily activities. This is usually used to diagnose what is causing palpitations/syncope (passing out).;Florina Ou will wear a 7 day ZIO monitor  Follow-Up: Your physician wants you to follow-up in: 4 months with Lewayne Bunting, MD    Any Other Special Instructions Will Be Listed Below (If Applicable).  If you need a refill on your cardiac medications before your next appointment, please call your pharmacy.    Your physician has recommended that you wear a Zio monitor.   This monitor is a medical device that records the heart's electrical activity. Doctors most often use these monitors to diagnose arrhythmias. Arrhythmias are problems with the speed or rhythm of the heartbeat. The monitor is a small device applied to your chest. You can wear one while you do your normal daily activities. While wearing this monitor if you have any symptoms to push the button and record what you felt. Once you have worn this monitor for the period of time provider prescribed (Usually 14 days), you will return the monitor device in the postage paid box. Once it is returned they will download the data collected and provide [place] with a report which the provider will then review and we will call you with those results. Important tips:  Avoid showering during the first 24 hours of wearing the monitor. Avoid excessive sweating to help maximize wear  time. Do not submerge the device, no hot tubs, and no swimming pools. Keep any lotions or oils away from the patch. After 24 hours you may shower with the patch on. Take brief showers with your back facing the shower head.  Do not remove patch once it has been placed because that will interrupt data and decrease adhesive wear time. Push the button when you have any symptoms and write down what you were feeling. Once you have completed wearing your monitor, remove and place into box which has postage paid and place in your outgoing mailbox.  If for some reason you have misplaced your box then call our office and we can provide another box and/or mail it off for you.

## 2020-07-19 ENCOUNTER — Telehealth: Payer: Self-pay | Admitting: Critical Care Medicine

## 2020-07-19 ENCOUNTER — Telehealth: Payer: Self-pay | Admitting: *Deleted

## 2020-07-19 NOTE — Telephone Encounter (Signed)
Informed patient, Dr. Delford Field had contacted [place] and let us know you had not received your monitor. He had updated your address in the Epic system.   I contacted [employer] with your updated address.  The will expedite shipping on your monitor.  You should receive it tomorrow or the following day. Reviewed with patient how to return monitor to [facility] once she has worn for 7 days.

## 2020-07-19 NOTE — Telephone Encounter (Signed)
The patient contacted me by phone and she is yet to receive her Zio patch monitor by mail  I corrected her address in Epic can you resend a monitor thanks   Dr Delford Field

## 2020-07-20 DIAGNOSIS — Z419 Encounter for procedure for purposes other than remedying health state, unspecified: Secondary | ICD-10-CM | POA: Diagnosis not present

## 2020-07-23 DIAGNOSIS — R002 Palpitations: Secondary | ICD-10-CM

## 2020-08-12 NOTE — Progress Notes (Deleted)
Subjective:    Patient ID: Lydia Vasquez, female    DOB: 06-16-1960, 60 y.o.   MRN: [id number]  60 y.o.F here to est PCP  06/11/20 This a 60 year old female seen today to establish for primary care.  This patient previously was living in [place] came to [place] 2 years ago to help care for her mother who is quite elderly.  The patient subsequent was hospitalized in October with urosepsis with blood in the urine precipitated by alcohol use as well and volume depletion.  Below is Documentation of this admission.  During the admission she did have documented ventricular tachycardia and was recommended she have follow-up with cardiology but this never occurred.  Also she had hepatic steatosis and liver cirrhosis on liver biopsy recommended to follow-up with gastroenterology this never occurred either.  Note patient now has full Medicaid. Admit date: 10/25/2019 Discharge date: 11/08/2019   Time spent: 35 minutes   Recommendations for Outpatient Follow-up:    Covid vaccination; no vaccination   Ventricular Tachycardia:  -Echo with EF 60-65% (see report) -10/13 metoprolol 25 mg BID -Resolved -10/14 cardiology signed off -Strict in and out  +6.6 L -Daily weight Filed Weights   11/06/19 0737 11/07/19 0500 11/08/19 0447 Weight: 113.4 kg 111.9 kg 111.3 kg -Follow-up with cardiology  in 2 weeks with EP Dr. Cristopher Peru, per [facility] MG recommendation secondary to ventricular tachycardia during hospitalization. Consider cardiac MRI   Essential HTN -See ventricular tachycardia   Direct Hyperbilirubinemia  Jaundice  Hepatic Steatosis/EtOH hepatitis: -10/18 elevated bili and alk phos, mildly elevated AST beginning to trend up. -RUQ [place] with cholelithiasis without findings c/w cholecystitis -Follow monospot - negative -Follow MRCP - notable for hepatomegaly and diffuse hepatic steatosis.  No obstructive cause for jaundice identified.  Gallbladder wall thickening (up to 0.6 cm) with  multiple small gallstones.  Traces ascites.   -A1 antitrypsin elevated -ANA negative -Antimitochondrial antibody negative -Ceruloplasmin WNL -10/11 liver biopsy; consistent with liver cirrhosis see results below -Would consider Prednisolone ([place] Discriminant Function 39.8, it drops to 26 when using upper limit of normal for our lab 15 instead of 12), will defer to GI -> planning to f/u in 4-6 weeks per GI. -10/18 with patient's liver enzymes beginning to trend up and leukocytosis held patient's discharge.  Contacted [facility] spoke with Dr. Alessandra Bevels, who stated Dr. Cristina Gong would round on patient today.  Spoke with PA Ebony Hail from [facility] they are going to discuss plan of care, will await further recommendations -[facility] evaluated patient, and has scheduled follow-up appointment with Dr. Therisa Doyne on 11/19.    Liver cirrhosis stage IV -Stage IV/IV see results for core biopsy below   Bilateral LE edema:  -Hypotension with fluid overload. -10/14 albumin 25 g + Lasix IV 60 mg BID -10/15 Albumin 50 g; increase Lasix IV 60 mg TID -Most likely related to cirrhosis with volume overload and height follow-up anemia -[place] negative for DVT -See ventricular tachycardia -Limit fluid intake to 1 L/day. -Lasix p.o. 80 mg TID; Lasix has been working well for patient WILL NOT add spironolactone at this time.  Will defer to cardiology or GI if they wish to add at her follow-up appointment.   Severe sepsis positive E. coli UTI -Met guidelines for sepsis on admission HR > 90, RR> 20, sign of infection urinary tract.>  2 - Present on admission. -Urine cx with e. Coli sensitive to ancef, completed 7 days of antibiotics -Follow blood cultures - NGTD -Sepsis physiology  improved   Hypokalemia -Potassium goal > 4  -10/19 K-Dur 50 mEq prior to discharge -K-Dur 40 mEq QID   Hypomagnesmia -Magnesium goal> 2   Hyperlipidemia with hepatic steatosis: -Elevated triglycerides, LDL not calculated.  HDL <10.   Continue to follow.   History of cervical cancer status post total hysterectomy: -In 2012.  CT scan with no evidence of recurrent cancer or obstruction.   Abnormal CXR: -Repeat 10/7 with persistent bibasilar atelectasis/infiltrates.  Small L pleural effusion, possible.  Continue to monitor.   Leukocytosis/Elevated liver enzymes -GI feels that elevation secondary to EtOH hepatitis and active steatohepatitis.  -10/19 GI has signed off and states patient is safe for discharge.        Goals of care -Per PT, no PT follow-up required     Discharge Diagnoses:  Principal Problem:   Sepsis due to urinary tract infection ([place]) Active Problems:   Hypokalemia   Sepsis ([place])   UTI (urinary tract infection)   Patient continues to complain of blood in the urine most recently as of the last several days.  She has difficulty starting her stream.  Patient also has difficulty with major depression and is having difficulty with adjustment to her mother's illness and her own conditions.  She has quit drinking alcohol at this time.  She notes her abdominal pain and swelling has improved somewhat.  7/25  UTI (urinary tract infection) Suspect recurrent urinary tract infection  Plan obtain urinalysis and urine culture  Antibiotics once results available  Given history of cervical cancer will refer to urology since patient has ongoing hematuria  Hypokalemia Follow-up metabolic panel to assess potassium levels  Ventricular tachycardia (HCC) History of ventricular tachycardia we will resume metoprolol 25 mg twice daily and obtain cardiology consultation  Alcoholic cirrhosis of liver without ascites ([place]) Alcoholic liver cirrhosis without ascites and steatosis  Referral to gastroenterology made  Patient is abstinent from alcohol use  Hypomagnesemia Recheck magnesium levels  Major depression, recurrent, chronic (HCC) We will begin sertraline 50 mg daily and get this patient connected to our  licensed clinical social worker  History of alcohol abuse History of alcohol abuse currently in remission but high risk for recurrence  Connect with mental health  BMI 35.0-35.9,adult Elevated BMI patient given dietary and exercise regimen  History of cervical cancer History of cervical cancer status post complete hysterectomy  Recent CT scan of the abdomen negative for recurrence   Lydia Vasquez was seen today for new patient (initial visit).  Diagnoses and all orders for this visit:  Alcoholic cirrhosis of liver without ascites ([place]) -     Ambulatory referral to Gastroenterology  Acute cystitis with hematuria -     Ambulatory referral to Urology -     Urinalysis -     Urine Culture -     CBC with Differential/Platelet  Ventricular tachycardia ([place]) -     Ambulatory referral to Cardiology -     Comprehensive metabolic panel  Encounter for screening mammogram for malignant neoplasm of breast -     MM DIGITAL SCREENING BILATERAL; Future  Hypokalemia -     Comprehensive metabolic panel  Hypomagnesemia -     Magnesium  Need for hepatitis C screening test -     HCV Ab w Reflex to Quant PCR  Major depression, recurrent, chronic (HCC)  History of alcohol abuse  BMI 35.0-35.9,adult  History of cervical cancer  Other orders -     B Complex-C-Folic Acid (B COMPLEX-VITAMIN C-FOLIC ACID) 1 MG  tablet; Take 1 tablet by mouth daily with breakfast. -     famotidine (PEPCID) 20 MG tablet; Take 1 tablet (20 mg total) by mouth 2 (two) times daily. -     metoprolol tartrate (LOPRESSOR) 25 MG tablet; Take 1 tablet (25 mg total) by mouth 2 (two) times daily. -     sertraline (ZOLOFT) 50 MG tablet; Take 1 tablet (50 mg total) by mouth daily.    Past Medical History:  Diagnosis Date   Anxiety    Depression    Hyperlipidemia    Hypertension      No family history on file.   Social History   Socioeconomic History   Marital status: Single    Spouse name: Not on file    Number of children: Not on file   Years of education: Not on file   Highest education level: Not on file  Occupational History   Not on file  Tobacco Use   Smoking status: Never   Smokeless tobacco: Never  Substance and Sexual Activity   Alcohol use: Never   Drug use: Never   Sexual activity: Not on file  Other Topics Concern   Not on file  Social History Narrative   Not on file   Social Determinants of Health   Financial Resource Strain: Not on file  Food Insecurity: Not on file  Transportation Needs: Not on file  Physical Activity: Not on file  Stress: Not on file  Social Connections: Not on file  Intimate Partner Violence: Not on file     No Known Allergies   Outpatient Medications Prior to Visit  Medication Sig Dispense Refill   B Complex-C-Folic Acid (B COMPLEX-VITAMIN C-FOLIC ACID) 1 MG tablet Take 1 tablet by mouth daily with breakfast. 30 tablet 2   famotidine (PEPCID) 20 MG tablet Take 1 tablet (20 mg total) by mouth 2 (two) times daily. 60 tablet 3   metoprolol tartrate (LOPRESSOR) 25 MG tablet Take 1 tablet (25 mg total) by mouth 2 (two) times daily. 60 tablet 3   sertraline (ZOLOFT) 50 MG tablet Take 1 tablet (50 mg total) by mouth daily. 30 tablet 3   sulfamethoxazole-trimethoprim (BACTRIM DS) 800-160 MG tablet Take 1 tablet by mouth 2 (two) times daily. 14 tablet 0   No facility-administered medications prior to visit.      Review of Systems  Constitutional:  Positive for appetite change and fatigue.  HENT:  Positive for voice change. Negative for ear discharge, ear pain, postnasal drip and rhinorrhea.   Respiratory:  Positive for shortness of breath.   Gastrointestinal:  Positive for abdominal distention and abdominal pain. Negative for anal bleeding, blood in stool, constipation, diarrhea, nausea, rectal pain and vomiting.  Genitourinary:  Positive for difficulty urinating, flank pain and hematuria. Negative for dysuria.  Psychiatric/Behavioral:   Positive for dysphoric mood. Negative for decreased concentration, self-injury and suicidal ideas. The patient is nervous/anxious.       Objective:   Physical Exam There were no vitals filed for this visit.   Gen: Pleasant,obese, in no distress,  tearful affect  ENT: No lesions,  mouth clear,  oropharynx clear, no postnasal drip  Neck: No JVD, no TMG, no carotid bruits  Lungs: No use of accessory muscles, no dullness to percussion, clear without rales or rhonchi  Cardiovascular: RRR, heart sounds normal, no murmur or gallops, no peripheral edema  Abdomen: soft and NT, no HSM,  BS normal  Musculoskeletal: No deformities, no cyanosis or clubbing  Neuro:  alert, non focal  Skin: Warm, no lesions or rashes  PHQ9 SCORE ONLY 06/11/2020  PHQ-9 Total Score 23   BMP Latest Ref Rng & Units 06/11/2020 11/29/2019 11/08/2019  Glucose 65 - 99 mg/dL 100(H) 103(H) 96  BUN 6 - 24 mg/dL 12 10 25(H)  Creatinine 0.57 - 1.00 mg/dL 0.59 0.95 0.74  BUN/Creat Ratio 9 - 23 20 - -  Sodium 134 - 144 mmol/L 141 136 136  Potassium 3.5 - 5.2 mmol/L 3.7 3.9 3.2(L)  Chloride 96 - 106 mmol/L 105 97(L) 93(L)  CO2 20 - 29 mmol/L _0 Calcium 8.7 - 10.2 mg/dL 9.2 9.1 8.8(L)   Hepatic Function Latest Ref Rng & Units 06/11/2020 11/29/2019 11/08/2019  Total Protein 6.0 - 8.5 g/dL 8.1 8.5(H) 7.6  Albumin 3.8 - 4.9 g/dL 4.0 2.6(L) 2.4(L)  AST 0 - 40 IU/L 40 151(H) 157(H)  ALT 0 - 32 IU/L 17 36 33  Alk Phosphatase 44 - 121 IU/L 195(H) 201(H) 181(H)  Total Bilirubin 0.0 - 1.2 mg/dL 0.9 5.9(H) 13.9(H)  Bilirubin, Direct 0.0 - 0.2 mg/dL - - -   CBC Latest Ref Rng & Units 06/11/2020 11/29/2019 11/08/2019  WBC 3.4 - 10.8 x10E3/uL 4.5 11.2(H) 18.2(H)  Hemoglobin 11.1 - 15.9 g/dL 11.8 11.3(L) 9.8(L)  Hematocrit 34.0 - 46.6 % 36.9 35.8(L) 28.0(L)  Platelets 150 - 450 x10E3/uL 267 365 403(H)          Assessment & Plan:  I personally reviewed all images and lab data in the [facility] system as well as any outside  material available during this office visit and agree with the  radiology impressions.   No problem-specific Assessment & Plan notes found for this encounter.   There are no diagnoses linked to this encounter.  I spent 46 minutes evaluating this patient reviewing prior discharge summary, reviewing all records and [place] link examining the patient highly complex medical decision making also spent in multiple referrals issued

## 2020-08-13 ENCOUNTER — Ambulatory Visit: Payer: Medicaid Other | Attending: Critical Care Medicine | Admitting: Critical Care Medicine

## 2020-08-13 ENCOUNTER — Other Ambulatory Visit: Payer: Self-pay

## 2020-08-13 ENCOUNTER — Ambulatory Visit (HOSPITAL_BASED_OUTPATIENT_CLINIC_OR_DEPARTMENT_OTHER): Payer: Medicaid Other | Admitting: Clinical

## 2020-08-13 ENCOUNTER — Encounter: Payer: Self-pay | Admitting: Critical Care Medicine

## 2020-08-13 VITALS — BP 123/80 | HR 94 | Temp 98.1°F | Resp 16 | Ht 71.0 in | Wt 269.0 lb

## 2020-08-13 DIAGNOSIS — F339 Major depressive disorder, recurrent, unspecified: Secondary | ICD-10-CM | POA: Diagnosis not present

## 2020-08-13 DIAGNOSIS — I472 Ventricular tachycardia, unspecified: Secondary | ICD-10-CM

## 2020-08-13 DIAGNOSIS — F4321 Adjustment disorder with depressed mood: Secondary | ICD-10-CM | POA: Diagnosis not present

## 2020-08-13 DIAGNOSIS — F419 Anxiety disorder, unspecified: Secondary | ICD-10-CM | POA: Diagnosis not present

## 2020-08-13 DIAGNOSIS — F331 Major depressive disorder, recurrent, moderate: Secondary | ICD-10-CM

## 2020-08-13 DIAGNOSIS — Z23 Encounter for immunization: Secondary | ICD-10-CM

## 2020-08-13 DIAGNOSIS — Z7689 Persons encountering health services in other specified circumstances: Secondary | ICD-10-CM | POA: Diagnosis not present

## 2020-08-13 DIAGNOSIS — N3000 Acute cystitis without hematuria: Secondary | ICD-10-CM

## 2020-08-13 DIAGNOSIS — F4001 Agoraphobia with panic disorder: Secondary | ICD-10-CM | POA: Diagnosis not present

## 2020-08-13 DIAGNOSIS — K7030 Alcoholic cirrhosis of liver without ascites: Secondary | ICD-10-CM | POA: Diagnosis not present

## 2020-08-13 MED ORDER — SERTRALINE HCL 100 MG PO TABS: 100.0000 mg | ORAL_TABLET | Freq: Every day | ORAL | 2 refills | Status: DC

## 2020-08-13 NOTE — Assessment & Plan Note (Signed)
Diagnosed on liver biopsy in 10/2019. Has been referred to GI but having problems with getting referral placed. Will attempt again today. Plan to continue B vitamin complex. Patient is abstaining from alcohol and is motivated to remain alcohol free.

## 2020-08-13 NOTE — Assessment & Plan Note (Signed)
Completed Zio patch but has not gotten results. Still having daily intermittent palpitations and diminished exercise capacity. Complicated by panic attacks and preoccupied with her health and her mother's health. Plan to continue metoprolol and will call the Zio patch company for results.

## 2020-08-13 NOTE — Progress Notes (Signed)
F/u with  Medication management- Results from cardiology test- Zio Patch

## 2020-08-13 NOTE — Progress Notes (Signed)
Established Patient Office Visit  Subjective:  Patient ID: Lydia Vasquez, female    DOB: 1960/02/11  Age: 60 y.o. MRN: [id number]  CC:  Chief Complaint  Patient presents with   Depression   Urinary Tract Infection    HPI Lydia Vasquez presents for a routine two month follow up regarding her chronic conditions. She recently wore and returned a Zio patch ordered by cardiology for ventricular tachycardia but has not gotten the results. She still complains of daily intermittent palpitations that occur randomly and last for a few moments. She also notes severely diminished exercise capacity and cannot walk short distances without getting short of breath. She denies fevers, cough or sputum production. Continues to take metoprolol 25 mg daily.   In October she was seen in the ER for a UTI and admitted to the hospital with sepsis due to [facility]. She finished a course of oral bactrim for a suspected urinary tract infection after her hospitalization. She says the hematuria has resolved and denies any urinary complaints. She was referred to a urologist but was not in her network. She was given the phone number for another urologist to call in her network. 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 She has since abstained from alcohol use, was referred to Gastroenterology but has not been contacted yet.    Since her liver biopsy, she complaints of bilateral coldness and paresthesias to dorsal feet that extends proximally up to her mid calf. She states the neuropathy has been constant since her visit and denies falling or injury to either foot. She was evaluated in the ER and labs were within normal range. Her symptoms may have been associated secondary to chronic alcohol use. She was discharged with B complex vitamins which she continues to take.   At her last follow-up, she was started on sertraline 25 mg for  depressive symptoms related to her personal health and the health of her mother who is currently hospitalized. She says she feels no different and states "I have a high tolerance to medications". Her PHQ9 was positive for suicidal ideations and when discussed, she does not have a plan. She does endorse regular panic attacks and has a comorbid condition of ventricular tachycardia. She is tearful for parts of the visit but maintains a clear thought process.   Past Medical History:  Diagnosis Date   Anxiety    Depression    Hyperlipidemia    Hypertension     Past Surgical History:  Procedure Laterality Date   ABDOMINAL HYSTERECTOMY     CESAREAN SECTION      History reviewed. No pertinent family history.  Social History   Socioeconomic History   Marital status: Single    Spouse name: Not on file   Number of children: Not on file   Years of education: Not on file   Highest education level: Not on file  Occupational History   Not on file  Tobacco Use   Smoking status: Never   Smokeless tobacco: Never  Substance and Sexual Activity   Alcohol use: Never   Drug use: Never   Sexual activity: Not on file  Other Topics Concern   Not on file  Social History Narrative   Not on file   Social Determinants of Health   Financial Resource Strain: Not on file  Food Insecurity: Not on file  Transportation Needs: Not on file  Physical Activity: Not on file  Stress:  Not on file  Social Connections: Not on file  Intimate Partner Violence: Not on file    Outpatient Medications Prior to Visit  Medication Sig Dispense Refill   B Complex-C-Folic Acid (B COMPLEX-VITAMIN C-FOLIC ACID) 1 MG tablet Take 1 tablet by mouth daily with breakfast. 30 tablet 2   famotidine (PEPCID) 20 MG tablet Take 1 tablet (20 mg total) by mouth 2 (two) times daily. 60 tablet 3   metoprolol tartrate (LOPRESSOR) 25 MG tablet Take 1 tablet (25 mg total) by mouth 2 (two) times daily. 60 tablet 3   sertraline  (ZOLOFT) 50 MG tablet Take 1 tablet (50 mg total) by mouth daily. 30 tablet 3   sulfamethoxazole-trimethoprim (BACTRIM DS) 800-160 MG tablet Take 1 tablet by mouth 2 (two) times daily. 14 tablet 0   No facility-administered medications prior to visit.    No Known Allergies  ROS Review of Systems  Constitutional:  Positive for appetite change and fatigue. Negative for chills, diaphoresis and fever.  HENT: Negative.    Eyes: Negative.   Respiratory:  Positive for shortness of breath. Negative for cough and choking.   Cardiovascular:  Positive for palpitations. Negative for chest pain.  Gastrointestinal:  Negative for abdominal distention, abdominal pain, constipation, diarrhea, nausea and vomiting.  Endocrine: Negative.   Genitourinary:  Negative for dysuria, flank pain, frequency and hematuria.  Neurological:  Positive for numbness. Negative for tremors, syncope and weakness.  Psychiatric/Behavioral:  Positive for sleep disturbance and suicidal ideas. Negative for agitation and self-injury. The patient is nervous/anxious.      Objective:    Physical Exam Constitutional:      General: She is not in acute distress.    Appearance: Normal appearance. She is obese. She is not ill-appearing.  HENT:     Head: Normocephalic and atraumatic.     Nose: Nose normal.     Mouth/Throat:     Pharynx: Oropharynx is clear.  Eyes:     Pupils: Pupils are equal, round, and reactive to light.  Cardiovascular:     Rate and Rhythm: Normal rate and regular rhythm.     Pulses: Normal pulses.     Heart sounds: Normal heart sounds.  Pulmonary:     Effort: Pulmonary effort is normal.     Breath sounds: Normal breath sounds.  Abdominal:     General: Bowel sounds are normal.     Palpations: Abdomen is soft.  Musculoskeletal:     Cervical back: Neck supple.  Neurological:     Mental Status: She is alert.  Psychiatric:        Attention and Perception: Attention and perception normal.        Mood and  Affect: Mood is depressed.        Speech: Speech normal.        Behavior: Behavior normal. Behavior is cooperative.        Thought Content: Thought content does not include suicidal plan.        Cognition and Memory: Cognition and memory normal.    BP 123/80 (BP Location: Left Arm, Patient Position: Sitting, Cuff Size: Large)   Pulse 94   Temp 98.1 F (36.7 C) (Oral)   Resp 16   Ht _0  (1.803 m)   Wt 269 lb (122 kg)   SpO2 96%   BMI 37.52 kg/m  Wt Readings from Last 3 Encounters:  08/13/20 269 lb (122 kg)  06/29/20 253 lb (114.8 kg)  06/11/20 253 lb 12.8 oz (115.1 kg)  Health Maintenance Due  Topic Date Due   Pneumococcal Vaccine 70-27 Years old (1 - PCV) Never done   COLONOSCOPY (Pts 45-85yr Insurance coverage will need to be confirmed)  Never done   MAMMOGRAM  Never done   Zoster Vaccines- Shingrix (1 of 2) Never done    There are no preventive care reminders to display for this patient.  No results found for: TSH Lab Results  Component Value Date   WBC 4.5 06/11/2020   HGB 11.8 06/11/2020   HCT 36.9 06/11/2020   MCV 79 06/11/2020   PLT 267 06/11/2020   Lab Results  Component Value Date   NA 141 06/11/2020   K 3.7 06/11/2020   CO2 20 06/11/2020   GLUCOSE 100 (H) 06/11/2020   BUN 12 06/11/2020   CREATININE 0.59 06/11/2020   BILITOT 0.9 06/11/2020   ALKPHOS 195 (H) 06/11/2020   AST 40 06/11/2020   ALT 17 06/11/2020   PROT 8.1 06/11/2020   ALBUMIN 4.0 06/11/2020   CALCIUM 9.2 06/11/2020   ANIONGAP 10 11/29/2019   EGFR 104 06/11/2020   Lab Results  Component Value Date   CHOL 192 10/26/2019   Lab Results  Component Value Date   HDL <10 (L) 10/26/2019   Lab Results  Component Value Date   LDLCALC NOT CALCULATED 10/26/2019   Lab Results  Component Value Date   TRIG 332 (H) 10/26/2019   Lab Results  Component Value Date   CHOLHDL NOT CALCULATED 10/26/2019   No results found for: HGBA1C    Assessment & Plan:   Problem List Items  Addressed This Visit       Cardiovascular and Mediastinum   Ventricular tachycardia ([place]    Completed Zio patch but has not gotten results. Still having daily intermittent palpitations and diminished exercise capacity. Complicated by panic attacks and preoccupied with her health and her mother's health. Plan to continue metoprolol and [place].          Digestive   Alcoholic cirrhosis of liver without ascites ([place]    Diagnosed on liver biopsy in 10/2019. Has been referred to GI but having problems with getting referral placed. Will attempt again today. Plan to continue B vitamin complex. Patient is abstaining from alcohol and is motivated to remain alcohol free.          Genitourinary   UTI (urinary tract infection)    Recently hospitalized in October for UTI causing sepsis. E. Coli identified on culture. She recently completed bactrim and symptoms are now resolved. She was referred to urologist but had to change to a provider in her network.          Other   Major depression, recurrent, chronic (HCC)    Still with active depression , a lot of life stresses, mother is ill in hosp with metastatic CA likely,  Agreeable to med adjustment and need for psych referral.  Pt saw our LCSW Asante:  And helped a lot .  Will faciliate psych referral       Relevant Medications   sertraline (ZOLOFT) 100 MG tablet   Other Relevant Orders   Ambulatory referral to Psychiatry   Other Visit Diagnoses     Need for vaccination against Streptococcus pneumoniae    -  Primary   Relevant Orders   Pneumococcal conjugate vaccine 20-valent (Prevnar 20) (Completed)   Need for Tdap vaccination       Relevant Orders   Pneumococcal conjugate vaccine  20-valent (Prevnar 20) (Completed)       Meds ordered this encounter  Medications   sertraline (ZOLOFT) 100 MG tablet    Sig: Take 1 tablet (100 mg total) by mouth daily.    Dispense:  60 tablet    Refill:  2     Follow-up: Return in about 2 months (around 10/14/2020).  43 min time spent  Asencion Noble, MD

## 2020-08-13 NOTE — BH Specialist Note (Signed)
Integrated Behavioral Health Initial In-Person Visit  MRN: [id number] Name: Lydia Vasquez  Number of Integrated Behavioral Health Clinician visits:: 1/6 Session Start time: 2:40PM  Session End time: 3:20PM Total time: 40  minutes  Types of Service: Individual psychotherapy  Interpretor:No. Interpretor Name and Language: N/A   Warm Hand Off Completed.         Subjective: Lydia Vasquez is a 60 y.o. female accompanied by  self Patient was referred by PCP Delford Field for depression, anxiety, grief, and SI. Patient reports the following symptoms/concerns: Pt reports feeling depression, difficulty sleeping, decreased energy, self-esteem disturbances, fidgeting, anxiousness, excessive worrying, difficulty relaxing, restlessness, irritability, social anxiety, and panic attacks. Reports experiencing grief due to her mother's health. Endorses passive SI.  Duration of problem: 1 year; Severity of problem: severe  Objective: Mood: Anxious, Depressed, and Hopeless and Affect: Appropriate and Tearful Risk of harm to self or others: Suicidal ideation  Life Context: Family and Social: Reports that she has four children who are adults. Reports having multiple siblings. Reports that her mother is currently hospitalized due to health.  School/Work: Pt currently caring for her mother.  Self-Care: Reports limited coping skills. Reports that she watches television as coping skill. Reports a hx of alcohol use but no longer uses due to health complications.  Life Changes: Reports that she is from [place] and came to [place] to care for her mother. Pt believes that her mother is dying and is constantly in the hospital. Pt has difficulty with accepting that her mother may pass due to health. Reports that her mother has significant heart and lung problems and potentially has cancer.   Patient and/or Family's Strengths/Protective Factors: Social connections, Social and Patent attorney, Concrete supports in place  (healthy food, safe environments, etc.), Sense of purpose, and Physical Health (exercise, healthy diet, medication compliance, etc.)  Goals Addressed: Patient will: Reduce symptoms of: anxiety, depression, insomnia, and stress Increase knowledge and/or ability of: coping skills, self-management skills, and stress reduction  Demonstrate ability to: Increase healthy adjustment to current life circumstances and Begin healthy grieving over loss  Progress towards Goals: Ongoing  Interventions: Interventions utilized: Mindfulness or Management consultant, CBT Cognitive Behavioral Therapy, Supportive Counseling, Sleep Hygiene, and Psychoeducation and/or Health Education  Standardized Assessments completed: C-SSRS Short, GAD-7, and PHQ 9  Patient and/or Family Response: Pt receptive to tx.   Patient Centered Plan: Patient is on the following Treatment Plan(s):  Pt will fu with LCSW and will be referred for ongoing outpatient therapy and psychiatry.  Assessment: Pt presents with depressed and anxious mood with an appropriate affect. Endorses passive SI. Denies plan, means, or intent. Endorses protective factors as children. Safety plan completed and pt provided with crisis resources. Acknowledged proper precaution to take if SI arises with plan, means, and intent. Denies HI. Denies auditory/visual hallucinations. Reports concerns with grief due to mother constantly being hospitalized. Pt's mother has significant health complications and pt came from [place] to care for her. Pt believes her mother is dying as she has been informed by medical professionals that there is not much support left to provide for her mother. Pt also worries about her health due to hx of issues with her liver as a result of alcohol use. Pt reports having cancer in the past. Pt worries excessively and reports frequent panic attacks and difficulty sleeping. Pt also reports that she frequently avoids going in public due to increase in  panic. Pt worries excessively about having panic attacks. Pt also appears to dwell on  her mother's death and is not embracing her life. Patient currently experiencing grief, depression, anxiety, and panic disorder.    Patient may benefit from increased activities with her mother in order to decrease her dwelling on her mother's death. Psychoeducation provided. Pt encouraged pt to utilize deep breathing exercises and meditation. Pt has hx of mental health tx and was involved in medication management. Pt would benefit from medication management and ongoing outpatient therapy. Pt will also fu with LCSW.  Plan: Follow up with behavioral health clinician on : 08/29/20 Behavioral recommendations: Utilize provided crisis resources if SI arises with plan, means, and intent. Utilize deep breathing exercises and meditation. Decrease thoughts about mother's death in order to enjoy the remainder of her life (do activities with her that she enjoys).  Referral(s): Integrated Art gallery manager (In Clinic), Psychiatrist, and Counselor "From scale of 1-10, how likely are you to follow plan?": 10  Jj Enyeart C Deveion Denz, LCSW

## 2020-08-13 NOTE — Assessment & Plan Note (Signed)
Still with active depression , a lot of life stresses, mother is ill in hosp with metastatic CA likely,  Agreeable to med adjustment and need for psych referral.  Pt saw our LCSW Asante:  And helped a lot .  Will faciliate psych referral

## 2020-08-13 NOTE — Assessment & Plan Note (Signed)
Recently hospitalized in October for UTI causing sepsis. E. Coli identified on culture. She recently completed bactrim and symptoms are now resolved. She was referred to urologist but had to change to a provider in her network.

## 2020-08-13 NOTE — Patient Instructions (Signed)
A referral to psychiatry will be made  Increase sertraline to 100 mg daily you can take 2 of the 50 mg tablets daily until the bottle is empty and then increase to 100 mg 1 tablet daily that was sent to the [facility] pharmacy  We gave you the number for urology to call to follow-up on your referral  We will communicate with gastroenterology to see where your referral stands  We will communicate with cardiology regarding the results of your Zio patch study  You connected with Asante our clinical social worker today's visit  Return to see Dr. Delford Field in 2 months

## 2020-08-16 ENCOUNTER — Ambulatory Visit: Payer: Medicaid Other

## 2020-08-20 DIAGNOSIS — Z419 Encounter for procedure for purposes other than remedying health state, unspecified: Secondary | ICD-10-CM | POA: Diagnosis not present

## 2020-08-27 ENCOUNTER — Telehealth: Payer: Self-pay | Admitting: Critical Care Medicine

## 2020-08-27 NOTE — Telephone Encounter (Signed)
Copied from CRM [id number]. Topic: General - Other >> Aug 27, 2020  1:10 PM Laural Benes, [place] C wrote: Reason for CRM: pt called in to speak with [facility], pt says that she has an apt 8/10. Pt says that she just buried her mother. Pt says that she is unable to attend that apt. Pt would like to have it cancelled and she will reschedule.

## 2020-08-29 ENCOUNTER — Ambulatory Visit: Payer: Medicaid Other | Admitting: Clinical

## 2020-09-04 ENCOUNTER — Ambulatory Visit
Admission: EM | Admit: 2020-09-04 | Discharge: 2020-09-04 | Disposition: A | Discharge status: Home / Self Care | Payer: Medicaid Other | Attending: Urgent Care | Admitting: Urgent Care

## 2020-09-04 ENCOUNTER — Other Ambulatory Visit: Payer: Self-pay

## 2020-09-04 ENCOUNTER — Ambulatory Visit: Payer: Self-pay | Admitting: *Deleted

## 2020-09-04 DIAGNOSIS — B029 Zoster without complications: Secondary | ICD-10-CM | POA: Diagnosis not present

## 2020-09-04 DIAGNOSIS — R208 Other disturbances of skin sensation: Secondary | ICD-10-CM

## 2020-09-04 MED ORDER — VALACYCLOVIR HCL 1 G PO TABS: 1000.0000 mg | ORAL_TABLET | Freq: Three times a day (TID) | ORAL | 0 refills | Status: DC

## 2020-09-04 MED ORDER — HYDROCODONE-ACETAMINOPHEN 5-325 MG PO TABS: 1.0000 | ORAL_TABLET | Freq: Four times a day (QID) | ORAL | 0 refills | Status: DC | PRN

## 2020-09-04 MED ORDER — GABAPENTIN 300 MG PO CAPS: 300.0000 mg | ORAL_CAPSULE | Freq: Three times a day (TID) | ORAL | 0 refills | Status: DC

## 2020-09-04 NOTE — Telephone Encounter (Signed)
Pt called in c/o terrible pain and itching in a rash that is located on her left side near her left breast, just under her armpit and down her left side wrapping around to her back.  This started 2 days ago and is getting worse.   Pt is crying with the pain.  There are no appts available at [facility].   I gave her the location of the mobile unit on [address] at [facility] in [place].   She informed me she doesn't drive and doesn't have anyone there with her.  "There is an urgent care near me".   I let her know that would be fine to go there.   I asked if it was close enough to walk to and she said,   "No I can't walk that far".    I suggested a taxi or [place].     "I will work out something so I can get to the urgent care near me".    "I can't stand this pain any longer".    "I'll work something out".   She thanked me for my help.

## 2020-09-04 NOTE — Telephone Encounter (Signed)
Reason for Disposition  [1] Shingles rash AND [2] spots start appearing other places on body  Answer Assessment - Initial Assessment Questions 1. ONSET: "When did the pain start?"     Pt calling in.   I have a rash in the middle of my back and under my arm down my side and to my back.  It itches very bad and is very painful.   I had chickenpox.    It started 2 days ago.   Started on my left side and now it's wrapped  around my left side.    2. LOCATION: "Where is the pain located?"     See above 3. PAIN: "How bad is the pain?" (Scale 1-10; or mild, moderate, severe)   - MILD (1-3): doesn't interfere with normal activities   - MODERATE (4-7): interferes with normal activities (e.g., work or school) or awakens from sleep   - SEVERE (8-10): excruciating pain, unable to do any normal activities, unable to hold a cup of water     severe 4. WORK OR EXERCISE: "Has there been any recent work or exercise that involved this part of the body?"     No 5. CAUSE: "What do you think is causing the arm pain?"     Shingles 6. OTHER SYMPTOMS: "Do you have any other symptoms?" (e.g., neck pain, swelling, rash, fever, numbness, weakness)     *No Answer* 7. PREGNANCY: "Is there any chance you are pregnant?" "When was your last menstrual period?"     *No Answer*  Answer Assessment - Initial Assessment Questions 1. APPEARANCE of RASH: "Describe the rash."      It hurts so bad and itches so bad.    2. LOCATION: "Where is the rash located?"      Located on her left side on breast side, on side wrapping around to her back. 3. ONSET: "When did the rash start?"      2 days ago 4. ITCHING: "Does the rash itch?" If Yes, ask: "How bad is the itch?"  (Scale 1-10; or mild, moderate, severe)     Yes 5. PAIN: "Does the rash hurt?" If Yes, ask: "How bad is the pain?"  (Scale 0-10; or none, mild, moderate, severe)    - NONE (0): no pain    - MILD (1-3): doesn't interfere with normal activities     - MODERATE (4-7):  interferes with normal activities or awakens from sleep     - SEVERE (8-10): excruciating pain, unable to do any normal activities     Severe pain 6. OTHER SYMPTOMS: "Do you have any other symptoms?" (e.g., fever)     No 7. PREGNANCY: "Is there any chance you are pregnant?" "When was your last menstrual period?"     N/A  Protocols used: Arm Pain-A-AH, Shingles (Zoster)-A-AH

## 2020-09-04 NOTE — ED Triage Notes (Signed)
Pt c/o a itchy painful rash to lt chest that wraps around to lt upper back x3 days.

## 2020-09-04 NOTE — ED Provider Notes (Signed)
[facility]   MRN: [id number] DOB: 11-23-1960  Subjective:   Lydia Vasquez is a 60 y.o. female presenting for 3-day history of acute onset severely painful rash over the left side of her chest that goes to her back.  Has used Tylenol without any kind of relief.  Denies fever, cough, shortness of breath, back injuries.  No exposure to poisonous plants or other exposures that would have caused the rash.  No current facility-administered medications for this encounter.  Current Outpatient Medications:    B Complex-C-Folic Acid (B COMPLEX-VITAMIN C-FOLIC ACID) 1 MG tablet, Take 1 tablet by mouth daily with breakfast., Disp: 30 tablet, Rfl: 2   famotidine (PEPCID) 20 MG tablet, Take 1 tablet (20 mg total) by mouth 2 (two) times daily., Disp: 60 tablet, Rfl: 3   metoprolol tartrate (LOPRESSOR) 25 MG tablet, Take 1 tablet (25 mg total) by mouth 2 (two) times daily., Disp: 60 tablet, Rfl: 3   sertraline (ZOLOFT) 100 MG tablet, Take 1 tablet (100 mg total) by mouth daily., Disp: 60 tablet, Rfl: 2   No Known Allergies  Past Medical History:  Diagnosis Date   Anxiety    Depression    Hyperlipidemia    Hypertension      Past Surgical History:  Procedure Laterality Date   ABDOMINAL HYSTERECTOMY     CESAREAN SECTION      History reviewed. No pertinent family history.  Social History   Tobacco Use   Smoking status: Never   Smokeless tobacco: Never  Substance Use Topics   Alcohol use: Never   Drug use: Never    ROS   Objective:   Vitals: BP (!) 141/87 (BP Location: Left Arm)   Pulse 77   Temp 97.8 F (36.6 C) (Oral)   Resp 18   SpO2 95%   Physical Exam Constitutional:      General: She is not in acute distress.    Appearance: Normal appearance. She is well-developed. She is not ill-appearing, toxic-appearing or diaphoretic.  HENT:     Head: Normocephalic and atraumatic.     Nose: Nose normal.     Mouth/Throat:     Mouth: Mucous membranes are moist.      Pharynx: Oropharynx is clear.  Eyes:     General: No scleral icterus.       Right eye: No discharge.        Left eye: No discharge.     Extraocular Movements: Extraocular movements intact.     Conjunctiva/sclera: Conjunctivae normal.     Pupils: Pupils are equal, round, and reactive to light.  Cardiovascular:     Rate and Rhythm: Normal rate.  Pulmonary:     Effort: Pulmonary effort is normal.  Skin:    General: Skin is warm and dry.       Neurological:     General: No focal deficit present.     Mental Status: She is alert and oriented to person, place, and time.     Motor: No weakness.     Coordination: Coordination normal.     Gait: Gait normal.     Deep Tendon Reflexes: Reflexes normal.  Psychiatric:        Mood and Affect: Mood normal.        Behavior: Behavior normal.        Thought Content: Thought content normal.        Judgment: Judgment normal.    Assessment and Plan :   I have reviewed the PDMP during  this encounter.  1. Herpes zoster without complication   2. Pain of skin     Start Valtrex to address Shingles rash. Schedule Tylenol, gabapentin and hydrocodone for breakthrough pain. Counseled patient on potential for adverse effects with medications prescribed/recommended today, ER and return-to-clinic precautions discussed, patient verbalized understanding.    Wallis Bamberg, PA-C 09/04/20 1238

## 2020-09-04 NOTE — Discharge Instructions (Addendum)
Please schedule Tylenol at 500 mg - 650 mg once every 6 hours as needed for aches and pains.  If you still have pain despite taking Tylenol regularly, this is breakthrough pain.  You can use hydrocodone once every 6 hours for this.  Once your pain is better controlled, switch back to just Tylenol. You can also take gabapentin for the shingles nerve pain.

## 2020-09-10 ENCOUNTER — Telehealth: Payer: Self-pay | Admitting: *Deleted

## 2020-09-10 NOTE — Telephone Encounter (Signed)
Copied from CRM [id number]. Topic: Appointment Scheduling - Scheduling Inquiry for Clinic >> Sep 10, 2020  1:59 PM Randol Kern wrote: Reason for CRM: Pt called to report that she has shingles and will be unable to attend this week's appt with Asante. Pt prefers having a phone call for her visit (Virtual request)   Best contact: [phone]

## 2020-09-10 NOTE — Telephone Encounter (Signed)
Copied from CRM [id number]. Topic: General - Other >> Sep 10, 2020 11:31 AM Jaquita Rector A wrote: Reason for CRM: Patient called in stated that she was trying to reach [facility] that it is very important need her to call back at the earliest convenience at  Ph# [phone]

## 2020-09-11 ENCOUNTER — Telehealth: Payer: Self-pay | Admitting: Critical Care Medicine

## 2020-09-11 MED ORDER — HYDROCODONE-ACETAMINOPHEN 5-325 MG PO TABS: 1.0000 | ORAL_TABLET | Freq: Four times a day (QID) | ORAL | 0 refills | Status: DC | PRN

## 2020-09-11 MED ORDER — GABAPENTIN 300 MG PO CAPS: 600.0000 mg | ORAL_CAPSULE | Freq: Three times a day (TID) | ORAL | 0 refills | Status: DC

## 2020-09-11 NOTE — Telephone Encounter (Signed)
Pt called to state she has shingles and cannot meet with Asante today.  Also needs refills on pain meds and gabapentin.  I will send short term Rx to her [facility]  also told pt to finish the valtrex and to obtain calamine lotion for the rash  Pdmp databased checked  no signs of drug diversion

## 2020-09-11 NOTE — Telephone Encounter (Signed)
Attempted to call pt to reschedule, no answer, left vm

## 2020-09-12 ENCOUNTER — Ambulatory Visit: Payer: Medicaid Other | Admitting: Clinical

## 2020-09-18 NOTE — Telephone Encounter (Signed)
Spoke with pt and rescheduled appt for 9.21.22

## 2020-09-20 DIAGNOSIS — Z419 Encounter for procedure for purposes other than remedying health state, unspecified: Secondary | ICD-10-CM | POA: Diagnosis not present

## 2020-09-24 ENCOUNTER — Other Ambulatory Visit: Payer: Self-pay | Admitting: Critical Care Medicine

## 2020-09-28 DIAGNOSIS — K766 Portal hypertension: Secondary | ICD-10-CM | POA: Diagnosis not present

## 2020-09-28 DIAGNOSIS — K7030 Alcoholic cirrhosis of liver without ascites: Secondary | ICD-10-CM | POA: Diagnosis not present

## 2020-09-28 DIAGNOSIS — Z1211 Encounter for screening for malignant neoplasm of colon: Secondary | ICD-10-CM | POA: Diagnosis not present

## 2020-09-28 DIAGNOSIS — Z7689 Persons encountering health services in other specified circumstances: Secondary | ICD-10-CM | POA: Diagnosis not present

## 2020-10-04 ENCOUNTER — Ambulatory Visit
Admission: RE | Admit: 2020-10-04 | Discharge: 2020-10-04 | Disposition: A | Discharge status: Home / Self Care | Payer: Medicaid Other | Source: Ambulatory Visit | Attending: Critical Care Medicine | Admitting: Critical Care Medicine

## 2020-10-04 ENCOUNTER — Other Ambulatory Visit: Payer: Self-pay

## 2020-10-04 DIAGNOSIS — Z7689 Persons encountering health services in other specified circumstances: Secondary | ICD-10-CM | POA: Diagnosis not present

## 2020-10-04 DIAGNOSIS — Z1231 Encounter for screening mammogram for malignant neoplasm of breast: Secondary | ICD-10-CM | POA: Diagnosis not present

## 2020-10-08 ENCOUNTER — Telehealth: Payer: Self-pay | Admitting: Critical Care Medicine

## 2020-10-08 DIAGNOSIS — K7030 Alcoholic cirrhosis of liver without ascites: Secondary | ICD-10-CM

## 2020-10-08 NOTE — Telephone Encounter (Unsigned)
Copied from CRM [id number]. Topic: Appointment Scheduling - Scheduling Inquiry for Clinic >> Oct 08, 2020  9:43 AM Elliot Gault wrote: Patient is scheduled with Asante McCoy on 10/10/2020 appointment. Patient thought her appointment was for 1pm. Patient would like to be worked in and would like the # to [employer]. Patient current transportation is not with [facility] and current transportation is not willing to rsc the time.  Patient would like a follow up call

## 2020-10-08 NOTE — Telephone Encounter (Signed)
Pt is scheduled a lab visit or 9/21 at 1045

## 2020-10-08 NOTE — Telephone Encounter (Signed)
GI MD requesting labs   See orders. Pls call pt to set up lab appt

## 2020-10-10 ENCOUNTER — Ambulatory Visit: Payer: Medicaid Other

## 2020-10-10 ENCOUNTER — Other Ambulatory Visit: Payer: Self-pay

## 2020-10-10 ENCOUNTER — Ambulatory Visit: Payer: Medicaid Other | Attending: Critical Care Medicine | Admitting: Clinical

## 2020-10-10 DIAGNOSIS — R45851 Suicidal ideations: Secondary | ICD-10-CM | POA: Diagnosis not present

## 2020-10-10 DIAGNOSIS — F331 Major depressive disorder, recurrent, moderate: Secondary | ICD-10-CM

## 2020-10-10 DIAGNOSIS — F4001 Agoraphobia with panic disorder: Secondary | ICD-10-CM | POA: Diagnosis not present

## 2020-10-10 DIAGNOSIS — K7030 Alcoholic cirrhosis of liver without ascites: Secondary | ICD-10-CM | POA: Diagnosis not present

## 2020-10-10 DIAGNOSIS — Z7689 Persons encountering health services in other specified circumstances: Secondary | ICD-10-CM | POA: Diagnosis not present

## 2020-10-11 ENCOUNTER — Other Ambulatory Visit: Payer: Self-pay | Admitting: Critical Care Medicine

## 2020-10-11 DIAGNOSIS — R928 Other abnormal and inconclusive findings on diagnostic imaging of breast: Secondary | ICD-10-CM

## 2020-10-11 LAB — ANA: Anti Nuclear Antibody (ANA): NEGATIVE

## 2020-10-12 DIAGNOSIS — Z7689 Persons encountering health services in other specified circumstances: Secondary | ICD-10-CM | POA: Diagnosis not present

## 2020-10-12 DIAGNOSIS — K8020 Calculus of gallbladder without cholecystitis without obstruction: Secondary | ICD-10-CM | POA: Diagnosis not present

## 2020-10-12 DIAGNOSIS — K7460 Unspecified cirrhosis of liver: Secondary | ICD-10-CM | POA: Diagnosis not present

## 2020-10-14 ENCOUNTER — Other Ambulatory Visit: Payer: Self-pay | Admitting: Critical Care Medicine

## 2020-10-14 NOTE — Telephone Encounter (Signed)
Zoloft last ordered 08/13/20 #60 with 2 refills. Patient should have enough medication thru the end of the year. Too early to  Refill at this time.

## 2020-10-14 NOTE — Telephone Encounter (Signed)
Only lab appointment scheduled for 10/15/20. Approved per protocol Zoloft last ordered 08/13/20 #30 with 3 refills-too early to refill at this time. Requested Prescriptions  Pending Prescriptions Disp Refills  . famotidine (PEPCID) 20 MG tablet [Pharmacy Med Name: FAMOTIDINE 20MG  TABLETS] 60 tablet 0    Sig: TAKE 1 TABLET(20 MG) BY MOUTH TWICE DAILY     Gastroenterology:  H2 Antagonists Passed - 10/14/2020  3:20 AM      Passed - Valid encounter within last 12 months    Recent Outpatient Visits          2 months ago Major depression, recurrent, chronic (HCC)   [facility] 10/16/2020, MD   4 months ago Alcoholic cirrhosis of liver without ascites [facility])   [place] [facility], MD             . sertraline (ZOLOFT) 50 MG tablet [Pharmacy Med Name: SERTRALINE 50MG  TABLETS] 30 tablet 3    Sig: TAKE 1 TABLET(50 MG) BY MOUTH DAILY     Psychiatry:  Antidepressants - SSRI Passed - 10/14/2020  3:20 AM      Passed - Completed PHQ-2 or PHQ-9 in the last 360 days      Passed - Valid encounter within last 6 months    Recent Outpatient Visits          2 months ago Major depression, recurrent, chronic (HCC)   [facility] , MD   4 months ago Alcoholic cirrhosis of liver without ascites [facility])   [place] [facility], MD             . metoprolol tartrate (LOPRESSOR) 25 MG tablet [Pharmacy Med Name: METOPROLOL TARTRATE 25MG  TABLETS] 60 tablet 3    Sig: TAKE 1 TABLET(25 MG) BY MOUTH TWICE DAILY     Cardiovascular:  Beta Blockers Failed - 10/14/2020  3:20 AM      Failed - Last BP in normal range    BP Readings from Last 1 Encounters:  09/04/20 (!) 141/87         Passed - Last Heart Rate in normal range    Pulse Readings from Last 1 Encounters:  09/04/20 77         Passed - Valid encounter within last 6 months    Recent Outpatient  Visits          2 months ago Major depression, recurrent, chronic (HCC)   [facility] 10/16/2020, MD   4 months ago Alcoholic cirrhosis of liver without ascites [facility])    [facility] Storm Frisk, MD

## 2020-10-15 ENCOUNTER — Ambulatory Visit: Payer: Medicaid Other | Attending: Critical Care Medicine

## 2020-10-15 ENCOUNTER — Other Ambulatory Visit: Payer: Self-pay

## 2020-10-15 DIAGNOSIS — K7030 Alcoholic cirrhosis of liver without ascites: Secondary | ICD-10-CM | POA: Diagnosis not present

## 2020-10-15 DIAGNOSIS — Z7689 Persons encountering health services in other specified circumstances: Secondary | ICD-10-CM | POA: Diagnosis not present

## 2020-10-16 LAB — CMP14+EGFR
ALT: 19 IU/L (ref 0–32)
AST: 35 IU/L (ref 0–40)
Albumin/Globulin Ratio: 1.1 — ABNORMAL LOW (ref 1.2–2.2)
Albumin: 4 g/dL (ref 3.8–4.9)
Alkaline Phosphatase: 191 IU/L — ABNORMAL HIGH (ref 44–121)
BUN/Creatinine Ratio: 18 (ref 9–23)
BUN: 10 mg/dL (ref 6–24)
Bilirubin Total: 1 mg/dL (ref 0.0–1.2)
CO2: 22 mmol/L (ref 20–29)
Calcium: 9.3 mg/dL (ref 8.7–10.2)
Chloride: 100 mmol/L (ref 96–106)
Creatinine, Ser: 0.56 mg/dL — ABNORMAL LOW (ref 0.57–1.00)
Globulin, Total: 3.5 g/dL (ref 1.5–4.5)
Glucose: 95 mg/dL (ref 70–99)
Potassium: 3.6 mmol/L (ref 3.5–5.2)
Sodium: 139 mmol/L (ref 134–144)
Total Protein: 7.5 g/dL (ref 6.0–8.5)
eGFR: 106 mL/min/{1.73_m2} (ref >59)

## 2020-10-16 LAB — ANTI-SMOOTH MUSCLE ANTIBODY, IGG: Smooth Muscle Ab: 5 Units (ref 0–19)

## 2020-10-16 LAB — HEPATITIS B SURFACE ANTIBODY,QUALITATIVE: Hep B Surface Ab, Qual: NONREACTIVE

## 2020-10-16 LAB — IRON,TIBC AND FERRITIN PANEL
Ferritin: 51 ng/mL (ref 15–150)
Iron Saturation: 40 % (ref 15–55)
Iron: 131 ug/dL (ref 27–159)
Total Iron Binding Capacity: 330 ug/dL (ref 250–450)
UIBC: 199 ug/dL (ref 131–425)

## 2020-10-16 LAB — AFP TUMOR MARKER: AFP, Serum, Tumor Marker: 2.5 ng/mL (ref 0.0–9.2)

## 2020-10-16 LAB — MITOCHONDRIAL ANTIBODIES: Mitochondrial Ab: <20 Units (ref 0.0–20.0)

## 2020-10-16 LAB — ALPHA-1-ANTITRYPSIN: A-1 Antitrypsin: 200 mg/dL — ABNORMAL HIGH (ref 101–187)

## 2020-10-16 LAB — PROTIME-INR
INR: 1.1 (ref 0.9–1.2)
Prothrombin Time: 11.7 s (ref 9.1–12.0)

## 2020-10-16 LAB — CERULOPLASMIN: Ceruloplasmin: 32.9 mg/dL (ref 19.0–39.0)

## 2020-10-16 NOTE — BH Specialist Note (Signed)
Integrated Behavioral Health Follow Up In-Person Visit  MRN: [id number] Name: Lydia Vasquez  Number of Integrated Behavioral Health Clinician visits: 2/6 Session Start time: 11:00am  Session End time: 12:00pm Total time: 60 minutes  Types of Service: Individual psychotherapy  Interpretor:No. Interpretor Name and Language: N/A  Subjective: Lydia Vasquez is a 60 y.o. female accompanied by  self Patient was referred by PCP Delford Field for depression, anxiety, grief, and SI. Marland Kitchen Patient reports the following symptoms/concerns: Reports feeling depressed, difficulty sleeping, decreased energy, self-esteem disturbances, trouble concentrating, anxiousness, excessive worrying, difficulty relaxing, irritability, restlessness, and panic attacks. Reports that she often avoids large crowds due to worrying about having a panic attack. Reports that her mother recently passed away and she is experiencing grief. Reports that she feels guilty due to not being with her in the hospital when she passed away. Reports that despite her mother's health complications, she still had hope. Reports that she has been visiting her mother's grave almost every day. Pt reports frustration with her current health circumstances due to being diagnosed with cirrhosis of the liver. Reports that she no longer drinks alcohol.  Duration of problem: 1 year; Severity of problem: moderate  Objective: Mood: Anxious and Depressed and Affect: Appropriate and Tearful Risk of harm to self or others: Suicidal ideation  Life Context: Family and Social: Reports that she has four children who are adults. Reports having multiple siblings.  School/Work: Pt reports receiving disability income.  Self-Care: Reports that she watches television as coping skill. Reports a hx of alcohol use but no longer uses due to health complications. Reports that she has been trying to increase time with family.  Life Changes: Reports that she is from [place] and came to [place] to  care for her mother. Pt's mother recently passed away. Pt also has experienced health challenges since being diagnosed with cirrhosis of the liver.   Patient and/or Family's Strengths/Protective Factors: Social connections, Social and Patent attorney, Concrete supports in place (healthy food, safe environments, etc.), Sense of purpose, and Physical Health (exercise, healthy diet, medication compliance, etc.)  Goals Addressed: Patient will:  Reduce symptoms of: anxiety, depression, insomnia, and stress   Increase knowledge and/or ability of: coping skills, self-management skills, and stress reduction   Demonstrate ability to: Increase healthy adjustment to current life circumstances and Begin healthy grieving over loss  Progress towards Goals: Ongoing  Interventions: Interventions utilized:  Mindfulness or Management consultant, CBT Cognitive Behavioral Therapy, and Supportive Counseling Standardized Assessments completed: C-SSRS Short, GAD-7, and PHQ 9  Patient and/or Family Response: Pt receptive to tx. Pt receptive to psychoeducation provided on anxiety, depression, and grief. Pt receptive to cognitive restructuring utilized to improve cognitive processing skills and decrease worries. Pt receptive to thought reframing with grief reaction. Pt receptive to decreasing the amount of time spent at mother's grave and will begin utilizing journalling. Pt receptive to relaxation techniques and will utilize deep breathing exercises.   Patient Centered Plan: Patient is on the following Treatment Plan(s): Depression, Anxiety, and Grief Assessment: Endorses passive SI. Denies plan, means, or intent. Endorses protective factors as children. Pt provided with crisis resources and verbal safety plan completed. Pt acknowledged proper precaution to take if SI arises with plans, means, and intent. Denies HI. Denies auditory/visual hallucinations. Patient currently experiencing depression and anxiety. Pt is  experiencing grief due to her mother recently passing away. Pt appears to have feelings of guilt as a result of not being with her mother when she passed away. Pt has been  going to her mother's grave site almost every day. Pt is also stressed due to current health complications with cirrhosis of the liver. Pt continues to avoid going in public places and has a fear of having panic attacks. Pt appears to be motivated to improve health due to wanting her mother to be proud.   Patient may benefit from therapy and psychiatry. Pt is still being referred for psychiatry. LCSWA provided psychoeducation on depression, anxiety, and grief. LCSWA utilized Chartered certified accountant. Pt would benefit from decreasing time at mother's grave. Pt reports her mothers birthday is in a month and she will attempt to wait until her mother's birthday to go to her grave. LCSWA encouraged pt to utilize grief journalling and deep breathing exercises. LCSWA will fu with pt.  Plan: Follow up with behavioral health clinician on : 10/24/20 Behavioral recommendations: Utilize deep breathing exercises, grief journalling, and the provided crisis resources if SI arises with plan, means, and intent. Decrease time spent at mother's grave site by waiting until her birthday. Referral(s): Integrated Art gallery manager (In Clinic), Psychiatrist, and Counselor "From scale of 1-10, how likely are you to follow plan?": 10  Chrissa Meetze C Asa Baudoin, LCSW

## 2020-10-20 DIAGNOSIS — Z419 Encounter for procedure for purposes other than remedying health state, unspecified: Secondary | ICD-10-CM | POA: Diagnosis not present

## 2020-10-24 ENCOUNTER — Ambulatory Visit: Payer: Medicaid Other | Admitting: Clinical

## 2020-11-01 ENCOUNTER — Other Ambulatory Visit: Payer: Medicaid Other

## 2020-11-05 ENCOUNTER — Ambulatory Visit: Payer: Medicaid Other | Admitting: Clinical

## 2020-11-14 ENCOUNTER — Other Ambulatory Visit: Payer: Medicaid Other

## 2020-11-19 ENCOUNTER — Ambulatory Visit: Payer: Medicaid Other | Admitting: Clinical

## 2020-11-20 ENCOUNTER — Other Ambulatory Visit: Payer: Medicaid Other

## 2020-11-20 DIAGNOSIS — Z419 Encounter for procedure for purposes other than remedying health state, unspecified: Secondary | ICD-10-CM | POA: Diagnosis not present

## 2020-11-29 ENCOUNTER — Ambulatory Visit
Admission: RE | Admit: 2020-11-29 | Discharge: 2020-11-29 | Disposition: A | Discharge status: Home / Self Care | Payer: Medicaid Other | Source: Ambulatory Visit | Attending: Critical Care Medicine | Admitting: Critical Care Medicine

## 2020-11-29 DIAGNOSIS — Z7689 Persons encountering health services in other specified circumstances: Secondary | ICD-10-CM | POA: Diagnosis not present

## 2020-11-29 DIAGNOSIS — R928 Other abnormal and inconclusive findings on diagnostic imaging of breast: Secondary | ICD-10-CM

## 2020-12-11 ENCOUNTER — Telehealth: Payer: Self-pay | Admitting: Clinical

## 2020-12-11 ENCOUNTER — Telehealth: Payer: Self-pay | Admitting: Critical Care Medicine

## 2020-12-11 NOTE — Telephone Encounter (Signed)
Called pt and made appointment. 

## 2020-12-11 NOTE — Telephone Encounter (Signed)
Called and spoke with pt to see if pt wanted to reschedule her appt with me. Pt seemed confused on the phone. LCSWA informed pt that she has an appt scheduled with [facility]. Pt mentioned that she was unable to make her appt tomorrow. LCSWA provided pt with contact information and address information and encouraged pt to call them and reschedule her appt. Pt mentioned that she will continue her care with [facility].

## 2020-12-11 NOTE — Telephone Encounter (Signed)
Pls call this pt and get her in with me face to face before 12/25  face to face ok to double book

## 2020-12-12 ENCOUNTER — Ambulatory Visit (HOSPITAL_COMMUNITY): Payer: Self-pay | Admitting: Licensed Clinical Social Worker

## 2020-12-20 ENCOUNTER — Ambulatory Visit (HOSPITAL_COMMUNITY): Payer: Self-pay | Admitting: Student in an Organized Health Care Education/Training Program

## 2020-12-20 DIAGNOSIS — Z419 Encounter for procedure for purposes other than remedying health state, unspecified: Secondary | ICD-10-CM | POA: Diagnosis not present

## 2020-12-21 ENCOUNTER — Telehealth: Payer: Self-pay | Admitting: Critical Care Medicine

## 2020-12-21 DIAGNOSIS — G629 Polyneuropathy, unspecified: Secondary | ICD-10-CM

## 2020-12-21 NOTE — Telephone Encounter (Signed)
Pt is wanting to speak with you , Called her to let her know you are out today but can call Monday.

## 2020-12-21 NOTE — Telephone Encounter (Signed)
Copied from CRM [id number]. Topic: General - Other >> Dec 20, 2020 10:26 AM Traci Sermon wrote: Reason for CRM: Pt called in stating if she could speak with PCP directly, please advise.

## 2020-12-24 ENCOUNTER — Encounter: Payer: Self-pay | Admitting: Neurology

## 2020-12-24 NOTE — Telephone Encounter (Signed)
Patient called stating she still having numbness and pain in her feet.  Note history of alcoholic cirrhosis she is no longer drinking since early this year.  She took Neurontin following a bout of zoster she states it did not really help the pain in the sided did not have any effect on her feet.  I plan to refer her to neurology for evaluation of her neuropathy

## 2021-01-03 ENCOUNTER — Ambulatory Visit: Payer: Medicaid Other | Admitting: Critical Care Medicine

## 2021-01-20 DIAGNOSIS — Z419 Encounter for procedure for purposes other than remedying health state, unspecified: Secondary | ICD-10-CM | POA: Diagnosis not present

## 2021-02-12 ENCOUNTER — Ambulatory Visit (INDEPENDENT_AMBULATORY_CARE_PROVIDER_SITE_OTHER): Payer: Medicaid Other | Admitting: Licensed Clinical Social Worker

## 2021-02-12 ENCOUNTER — Other Ambulatory Visit: Payer: Self-pay

## 2021-02-12 DIAGNOSIS — F339 Major depressive disorder, recurrent, unspecified: Secondary | ICD-10-CM | POA: Diagnosis not present

## 2021-02-12 DIAGNOSIS — Z7689 Persons encountering health services in other specified circumstances: Secondary | ICD-10-CM | POA: Diagnosis not present

## 2021-02-12 DIAGNOSIS — F4310 Post-traumatic stress disorder, unspecified: Secondary | ICD-10-CM | POA: Insufficient documentation

## 2021-02-12 NOTE — Plan of Care (Signed)
Pt agreeable to plan  ?
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
## 2021-02-14 ENCOUNTER — Other Ambulatory Visit: Payer: Self-pay

## 2021-02-14 ENCOUNTER — Ambulatory Visit (INDEPENDENT_AMBULATORY_CARE_PROVIDER_SITE_OTHER): Payer: Medicaid Other | Admitting: Student in an Organized Health Care Education/Training Program

## 2021-02-14 ENCOUNTER — Encounter (HOSPITAL_COMMUNITY): Payer: Self-pay | Admitting: Student in an Organized Health Care Education/Training Program

## 2021-02-14 VITALS — BP 154/96 | HR 84 | Temp 98.3°F | Ht 71.0 in | Wt 291.0 lb

## 2021-02-14 DIAGNOSIS — Z7689 Persons encountering health services in other specified circumstances: Secondary | ICD-10-CM | POA: Diagnosis not present

## 2021-02-14 DIAGNOSIS — F4310 Post-traumatic stress disorder, unspecified: Secondary | ICD-10-CM

## 2021-02-14 DIAGNOSIS — F339 Major depressive disorder, recurrent, unspecified: Secondary | ICD-10-CM

## 2021-02-14 MED ORDER — MIRTAZAPINE 15 MG PO TABS
15.0000 mg | ORAL_TABLET | Freq: Every day | ORAL | 0 refills | Status: DC
Start: 2021-02-14 — End: 2021-03-13
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
## 2021-02-20 ENCOUNTER — Ambulatory Visit: Payer: Medicaid Other | Admitting: Critical Care Medicine

## 2021-02-20 DIAGNOSIS — Z419 Encounter for procedure for purposes other than remedying health state, unspecified: Secondary | ICD-10-CM | POA: Diagnosis not present

## 2021-02-20 NOTE — Progress Notes (Deleted)
Established Patient Office Visit  Subjective:  Patient ID: Lydia Vasquez, female    DOB: May 20, 1960  Age: 61 y.o. MRN: [id number]  CC: No chief complaint on file.   HPI Lydia Vasquez presents for    Cardiovascular and Mediastinum    Ventricular tachycardia ([place])      Completed Zio patch but has not gotten results. Still having daily intermittent palpitations and diminished exercise capacity. Complicated by panic attacks and preoccupied with her health and her mother's health. Plan to continue metoprolol and will call the [place] for results.               Digestive    Alcoholic cirrhosis of liver without ascites ([place])      Diagnosed on liver biopsy in 10/2019. Has been referred to GI but having problems with getting referral placed. Will attempt again today. Plan to continue B vitamin complex. Patient is abstaining from alcohol and is motivated to remain alcohol free.               Genitourinary    UTI (urinary tract infection)      Recently hospitalized in October for UTI causing sepsis. E. Coli identified on culture. She recently completed bactrim and symptoms are now resolved. She was referred to urologist but had to change to a provider in her network.               Other    Major depression, recurrent, chronic (HCC)      Still with active depression , a lot of life stresses, mother is ill in hosp with metastatic CA likely,  Agreeable to med adjustment and need for psych referral.  Pt saw our LCSW [place]:  And helped a lot .  Will faciliate psych referral          Relevant Medications    sertraline (ZOLOFT) 100 MG tablet    Other Relevant Orders    Ambulatory referral to Psychiatry    Other Visit Diagnoses       Need for vaccination against Streptococcus pneumoniae    -  Primary    Relevant Orders    Pneumococcal conjugate vaccine 20-valent (Prevnar 20) (Completed)    Need for Tdap vaccination        Relevant Orders    Pneumococcal conjugate vaccine  20-valent (Prevnar 20) (Completed)     Past Medical History:  Diagnosis Date   Anxiety    Depression    Hyperlipidemia    Hypertension     Past Surgical History:  Procedure Laterality Date   ABDOMINAL HYSTERECTOMY     CESAREAN SECTION      No family history on file.  Social History   Socioeconomic History   Marital status: Single    Spouse name: Not on file   Number of children: Not on file   Years of education: Not on file   Highest education level: Not on file  Occupational History   Not on file  Tobacco Use   Smoking status: Never   Smokeless tobacco: Never  Substance and Sexual Activity   Alcohol use: Never   Drug use: Never   Sexual activity: Not on file  Other Topics Concern   Not on file  Social History Narrative   Not on file   Social Determinants of Health   Financial Resource Strain: Low Risk    Difficulty of Paying Living Expenses: Not hard at all  Food Insecurity: Food Insecurity Present   Worried About  Running Out of Food in the Last Year: Sometimes true   Ran Out of Food in the Last Year: Sometimes true  Transportation Needs: Unmet Transportation Needs   Lack of Transportation (Medical): Yes   Lack of Transportation (Non-Medical): Yes  Physical Activity: Inactive   Days of Exercise per Week: 0 days   Minutes of Exercise per Session: 0 min  Stress: Stress Concern Present   Feeling of Stress : Very much  Social Connections: Socially Isolated   Frequency of Communication with Friends and Family: Once a week   Frequency of Social Gatherings with Friends and Family: More than three times a week   Attends Religious Services: Never   Marine scientist or Organizations: No   Attends Music therapist: Never   Marital Status: Never married  Human resources officer Violence: Not At Risk   Fear of Current or Ex-Partner: No   Emotionally Abused: No   Physically Abused: No   Sexually Abused: No    Outpatient Medications Prior to Visit   Medication Sig Dispense Refill   B Complex-C-Folic Acid (B COMPLEX-VITAMIN C-FOLIC ACID) 1 MG tablet Take 1 tablet by mouth daily with breakfast. 30 tablet 2   famotidine (PEPCID) 20 MG tablet TAKE 1 TABLET(20 MG) BY MOUTH TWICE DAILY 60 tablet 0   metoprolol tartrate (LOPRESSOR) 25 MG tablet TAKE 1 TABLET(25 MG) BY MOUTH TWICE DAILY 60 tablet 0   mirtazapine (REMERON) 15 MG tablet Take 1 tablet (15 mg total) by mouth at bedtime. 30 tablet 0   No facility-administered medications prior to visit.    No Known Allergies  ROS Review of Systems    Objective:    Physical Exam  There were no vitals taken for this visit. Wt Readings from Last 3 Encounters:  08/13/20 269 lb (122 kg)  06/29/20 253 lb (114.8 kg)  06/11/20 253 lb 12.8 oz (115.1 kg)     Health Maintenance Due  Topic Date Due   COLONOSCOPY (Pts 45-66yr Insurance coverage will need to be confirmed)  Never done   Zoster Vaccines- Shingrix (1 of 2) Never done   INFLUENZA VACCINE  Never done    There are no preventive care reminders to display for this patient.  No results found for: TSH Lab Results  Component Value Date   WBC 4.5 06/11/2020   HGB 11.8 06/11/2020   HCT 36.9 06/11/2020   MCV 79 06/11/2020   PLT 267 06/11/2020   Lab Results  Component Value Date   NA 139 10/15/2020   K 3.6 10/15/2020   CO2 22 10/15/2020   GLUCOSE 95 10/15/2020   BUN 10 10/15/2020   CREATININE 0.56 (L) 10/15/2020   BILITOT 1.0 10/15/2020   ALKPHOS 191 (H) 10/15/2020   AST 35 10/15/2020   ALT 19 10/15/2020   PROT 7.5 10/15/2020   ALBUMIN 4.0 10/15/2020   CALCIUM 9.3 10/15/2020   ANIONGAP 10 11/29/2019   EGFR 106 10/15/2020   Lab Results  Component Value Date   CHOL 192 10/26/2019   Lab Results  Component Value Date   HDL <10 (L) 10/26/2019   Lab Results  Component Value Date   LDLCALC NOT CALCULATED 10/26/2019   Lab Results  Component Value Date   TRIG 332 (H) 10/26/2019   Lab Results  Component Value Date    CHOLHDL NOT CALCULATED 10/26/2019   No results found for: HGBA1C    Assessment & Plan:   Problem List Items Addressed This Visit   None  No orders of the defined types were placed in this encounter.   Follow-up: No follow-ups on file.    Asencion Noble, MD

## 2021-03-04 ENCOUNTER — Other Ambulatory Visit: Payer: Self-pay

## 2021-03-04 ENCOUNTER — Ambulatory Visit (INDEPENDENT_AMBULATORY_CARE_PROVIDER_SITE_OTHER): Payer: Medicaid Other | Admitting: Neurology

## 2021-03-04 ENCOUNTER — Encounter: Payer: Self-pay | Admitting: Neurology

## 2021-03-04 VITALS — BP 153/89 | Ht 68.0 in | Wt 299.8 lb

## 2021-03-04 DIAGNOSIS — G621 Alcoholic polyneuropathy: Secondary | ICD-10-CM | POA: Diagnosis not present

## 2021-03-04 DIAGNOSIS — Z131 Encounter for screening for diabetes mellitus: Secondary | ICD-10-CM

## 2021-03-04 DIAGNOSIS — Z7689 Persons encountering health services in other specified circumstances: Secondary | ICD-10-CM | POA: Diagnosis not present

## 2021-03-04 NOTE — Patient Instructions (Addendum)
Check labs  Nerve testing of the legs. Do not apply lotion or oil to your legs and feet on the day of testing.  ELECTROMYOGRAM AND NERVE CONDUCTION STUDIES (EMG/NCS) INSTRUCTIONS  How to Prepare The neurologist conducting the EMG will need to know if you have certain medical conditions. Tell the neurologist and other EMG lab personnel if you: Have a pacemaker or any other electrical medical device Take blood-thinning medications Have hemophilia, a blood-clotting disorder that causes prolonged bleeding Bathing Take a shower or bath shortly before your exam in order to remove oils from your skin. Dont apply lotions or creams before the exam.  What to Expect Youll likely be asked to change into a hospital gown for the procedure and lie down on an examination table. The following explanations can help you understand what will happen during the exam.  Electrodes. The neurologist or a technician places surface electrodes at various locations on your skin depending on where youre experiencing symptoms. Or the neurologist may insert needle electrodes at different sites depending on your symptoms.  Sensations. The electrodes will at times transmit a tiny electrical current that you may feel as a twinge or spasm. The needle electrode may cause discomfort or pain that usually ends shortly after the needle is removed. If you are concerned about discomfort or pain, you may want to talk to the neurologist about taking a short break during the exam.  Instructions. During the needle EMG, the neurologist will assess whether there is any spontaneous electrical activity when the muscle is at rest - activity that isnt present in healthy muscle tissue - and the degree of activity when you slightly contract the muscle.  He or she will give you instructions on resting and contracting a muscle at appropriate times. Depending on what muscles and nerves the neurologist is examining, he or she may ask you to change  positions during the exam.  After your EMG You may experience some temporary, minor bruising where the needle electrode was inserted into your muscle. This bruising should fade within several days. If it persists, contact your primary care doctor.  Your provider has requested that you have labwork completed today. Please go to [facility] ([id number]) on the second floor of this building before leaving the office today. You do not need to check in. If you are not called within 15 minutes please check with the front desk.

## 2021-03-04 NOTE — Progress Notes (Signed)
[facility] Neurology Division Clinic Note - Initial Visit   Date: 03/04/21  Lydia Vasquez MRN: [id number] DOB: [birth year]   Dear Dr. Delford Field:  Thank you for your kind referral of Lydia Vasquez for consultation of bilateral feet numbness. Although her history is well known to you, please allow [place] to reiterate it for the purpose of our medical record. The patient was accompanied to the clinic by self.    History of Present Illness: Lydia Vasquez is a 61 y.o. right-handed female with history of alcohol abuse, alcoholic cirrhosis, anxiety/depression, hypertension, and hyperlipidemia presenting for evaluation of bilateral feet and leg numbness.  Numbness started in October while she was hospitalized and underwent liver biopsy which shows alcoholic cirrhosis.  During her hospitalization, she began having numbness involving the feet which has moved up to the mid-calf.  Symptoms are constant and aggravating because she cannot feel the ground.  She endroses some imbalance.  She was previously drinking fifth of liquor for about a year, but has been sober October 2021.  She denies tingling/burning.    She is not working.  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  She lives at home with her daughter.  Prior history of tobacco use.     Past Medical History:  Diagnosis Date   Anxiety    Depression    Hyperlipidemia    Hypertension     Past Surgical History:  Procedure Laterality Date   ABDOMINAL HYSTERECTOMY     CESAREAN SECTION       Medications:  Outpatient Encounter Medications as of 03/04/2021  Medication Sig   B Complex-C-Folic Acid (B COMPLEX-VITAMIN C-FOLIC ACID) 1 MG tablet Take 1 tablet by mouth daily with breakfast.   famotidine (PEPCID) 20 MG tablet TAKE 1 TABLET(20 MG) BY MOUTH TWICE DAILY   metoprolol tartrate (LOPRESSOR) 25 MG tablet TAKE 1 TABLET(25 MG) BY MOUTH TWICE DAILY   mirtazapine (REMERON) 15 MG tablet Take 1 tablet (15 mg total) by mouth at bedtime.   No  facility-administered encounter medications on file as of 03/04/2021.    Allergies: No Known Allergies  Family History: Family History  Problem Relation Age of Onset   Stroke Mother    Dementia Mother     Social History: Social History   Tobacco Use   Smoking status: Former    Types: Cigarettes   Smokeless tobacco: Never  Vaping Use   Vaping Use: Never used  Substance Use Topics   Alcohol use: Not Currently   Drug use: Never   Social History   Social History Narrative   Not on file    Vital Signs:  BP (!) 153/89    Ht 5\' 8"  (1.727 m)    Wt 299 lb 12.8 oz (136 kg)    BMI 45.58 kg/m    Neurological Exam: MENTAL STATUS including orientation to time, place, person, recent and remote memory, attention span and concentration, language, and fund of knowledge is normal.  Speech is not dysarthric.  CRANIAL NERVES: II:  No visual field defects.  III-IV-VI: Pupils equal round and reactive to light.  Normal conjugate, extra-ocular eye movements in all directions of gaze.  No nystagmus.  No ptosis.   V:  Normal facial sensation.    VII:  Normal facial symmetry and movements.   VIII:  Normal hearing and vestibular function.   IX-X:  Normal palatal movement.   XI:  Normal shoulder shrug and head rotation.   XII:  Normal tongue strength and range of  motion, no deviation or fasciculation.  MOTOR:  No atrophy, fasciculations or abnormal movements.  No pronator drift.   Upper Extremity:  Right  Left  Deltoid  5/5   5/5   Biceps  5/5   5/5   Triceps  5/5   5/5   Wrist extensors  5/5   5/5   Wrist flexors  5/5   5/5   Finger extensors  5/5   5/5   Finger flexors  5/5   5/5   Dorsal interossei  5/5   5/5   Abductor pollicis  5/5   5/5   Tone (Ashworth scale)  0  0   Lower Extremity:  Right  Left  Hip flexors  5/5   5/5   Knee flexors  5/5   5/5   Knee extensors  5/5   5/5   Dorsiflexors  5/5   5/5   Plantarflexors  5/5   5/5   Toe extensors  5/5   5/5   Toe flexors  5/5    5/5   Tone (Ashworth scale)  0  0   MSRs:  Right        Left                  brachioradialis 2+  2+  biceps 2+  2+  triceps 2+  2+  patellar 1+  1+  ankle jerk 0  0  Hoffman no  no  plantar response down  down   SENSORY: Vibration is reduced at the ankles, and absent at the great toe bilaterally.  Pin prick and temperature reduced from mid-calf down into feet.   Romberg's sign positive.   COORDINATION/GAIT: Normal finger-to- nose-finger.  Intact rapid alternating movements bilaterally.   Gait mildly wide-based and stable.   IMPRESSION: Alcohol-induced neuropathy.  Symptoms and exam is most suggestive of a distal and symmetric neuropathy. Risk factor:  former heavy alcohol use (sober since 2021).  To be complete, I will screen for other treatable causes for neuropathy.  I had extensive discussion with the patient regarding the pathogenesis, etiology, management, and natural course of neuropathy. Neuropathy tends to be slowly progressive and management is symptomatic.     PLAN/RECOMMENDATIONS:  NCS/EMG of the legs Check vitamin B12, folate, vitamin B1, TSH, HbA1c Symptoms predominately with numbness, so there is no role for medication such as gabapentin which is used for painful paresthesias Patient educated on daily foot inspection, fall prevention, and safety precautions around the home.  Further recommendations pending results.    Thank you for allowing me to participate in patient's care.  If I can answer any additional questions, I would be pleased to do so.    Sincerely,    Ellorie Kindall K. Allena Katz, DO

## 2021-03-07 ENCOUNTER — Encounter: Payer: Medicaid Other | Admitting: Neurology

## 2021-03-08 ENCOUNTER — Other Ambulatory Visit: Payer: Self-pay | Admitting: Critical Care Medicine

## 2021-03-13 ENCOUNTER — Telehealth (HOSPITAL_COMMUNITY): Payer: Self-pay | Admitting: *Deleted

## 2021-03-13 ENCOUNTER — Other Ambulatory Visit: Payer: Self-pay | Admitting: Critical Care Medicine

## 2021-03-13 DIAGNOSIS — F339 Major depressive disorder, recurrent, unspecified: Secondary | ICD-10-CM

## 2021-03-13 MED ORDER — MIRTAZAPINE 15 MG PO TABS: 15.0000 mg | ORAL_TABLET | Freq: Every day | ORAL | 0 refills | Status: DC

## 2021-03-13 NOTE — Telephone Encounter (Signed)
Received call from patient's pharmacy requesting refill of Remeron as she will run out today.  She does have a follow up appointment with me in March so I will send in a one month refill.   -Remeron 15 mg QHS; 15 tablets of 30 mg with 0 refills sent.   Arna Snipe MD Resident

## 2021-03-13 NOTE — Telephone Encounter (Signed)
Pharmacy request for patients Remeron. She has a future appt with Dr Renaldo Fiddler in early March, but she should be out now. WIll notify him of needed rx.

## 2021-03-13 NOTE — Telephone Encounter (Signed)
Requested Prescriptions  Pending Prescriptions Disp Refills   metoprolol tartrate (LOPRESSOR) 25 MG tablet [Pharmacy Med Name: METOPROLOL TARTRATE 25MG  TABLETS] 60 tablet 0    Sig: TAKE 1 TABLET(25 MG) BY MOUTH TWICE DAILY     Cardiovascular:  Beta Blockers Failed - 03/13/2021  3:19 AM      Failed - Last BP in normal range    BP Readings from Last 1 Encounters:  03/04/21 (!) 153/89         Failed - Valid encounter within last 6 months    Recent Outpatient Visits          7 months ago Major depression, recurrent, chronic (HCC)   [facility] 03/06/21, MD   9 months ago Alcoholic cirrhosis of liver without ascites [facility])   [facility], MD      Future Appointments            In 3 weeks Storm Frisk, MD [facility]           Passed - Last Heart Rate in normal range    Pulse Readings from Last 1 Encounters:  09/04/20 77

## 2021-03-13 NOTE — Addendum Note (Signed)
Addended by: Lauro Franklin on: 03/13/2021 03:31 PM   Modules accepted: Orders

## 2021-03-20 ENCOUNTER — Ambulatory Visit (HOSPITAL_COMMUNITY): Payer: Medicaid Other | Admitting: Licensed Clinical Social Worker

## 2021-03-20 DIAGNOSIS — Z419 Encounter for procedure for purposes other than remedying health state, unspecified: Secondary | ICD-10-CM | POA: Diagnosis not present

## 2021-03-27 ENCOUNTER — Telehealth: Payer: Self-pay | Admitting: Critical Care Medicine

## 2021-03-27 NOTE — Telephone Encounter (Signed)
..  Patient declines further follow up and engagement by the Managed Medicaid Team. Appropriate care team members and provider have been notified via electronic communication. The Managed Medicaid Team is available to follow up with the patient after provider conversation with the patient regarding recommendation for engagement and subsequent re-referral to the Managed Medicaid Team.    Jennifer Alley Care Guide, High Risk Medicaid Managed Care Embedded Care Coordination [place]  [facility]   

## 2021-03-28 ENCOUNTER — Encounter (HOSPITAL_COMMUNITY): Payer: Medicaid Other | Admitting: Student in an Organized Health Care Education/Training Program
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
## 2021-04-08 ENCOUNTER — Ambulatory Visit: Payer: Medicaid Other | Attending: Critical Care Medicine | Admitting: Critical Care Medicine

## 2021-04-08 ENCOUNTER — Other Ambulatory Visit: Payer: Self-pay

## 2021-04-08 ENCOUNTER — Encounter: Payer: Self-pay | Admitting: Critical Care Medicine

## 2021-04-08 VITALS — BP 135/85 | HR 79 | Wt 301.4 lb

## 2021-04-08 DIAGNOSIS — Z6841 Body Mass Index (BMI) 40.0 and over, adult: Secondary | ICD-10-CM | POA: Diagnosis not present

## 2021-04-08 DIAGNOSIS — N3000 Acute cystitis without hematuria: Secondary | ICD-10-CM | POA: Diagnosis not present

## 2021-04-08 DIAGNOSIS — I472 Ventricular tachycardia, unspecified: Secondary | ICD-10-CM

## 2021-04-08 DIAGNOSIS — K7030 Alcoholic cirrhosis of liver without ascites: Secondary | ICD-10-CM | POA: Diagnosis not present

## 2021-04-08 DIAGNOSIS — F339 Major depressive disorder, recurrent, unspecified: Secondary | ICD-10-CM

## 2021-04-08 DIAGNOSIS — R002 Palpitations: Secondary | ICD-10-CM

## 2021-04-08 NOTE — Assessment & Plan Note (Signed)
Now on Remeron at bedtime follow-up per mental health ?

## 2021-04-08 NOTE — Assessment & Plan Note (Signed)
Currently stable this time off alcohol ?

## 2021-04-08 NOTE — Assessment & Plan Note (Signed)
Has decreased on metoprolol increased dose ?

## 2021-04-08 NOTE — Patient Instructions (Signed)
Follow lifestyle medicine tips on eating and exercise (walking 3 times a week) ? ?Keep follow up appts with neurology and mental health ? ?No change in medications ? ?Return Dr Delford Field 4 months ?

## 2021-04-08 NOTE — Assessment & Plan Note (Signed)
BMI now 45-1/2 ? ?I spent about 10 minutes with the patient going over lifestyle medicine handout including beginning very small baby steps with an exercise program of walking 10 minutes 3 times a day with time to rest in between 3-4 times a week and slowly increase ? ?I also spent time over a healthier diet and educated her regarding this ?

## 2021-04-08 NOTE — Assessment & Plan Note (Signed)
This has resolved.

## 2021-04-08 NOTE — Assessment & Plan Note (Signed)
No active urinary tract infection.

## 2021-04-10 ENCOUNTER — Other Ambulatory Visit: Payer: Self-pay

## 2021-04-10 ENCOUNTER — Ambulatory Visit (INDEPENDENT_AMBULATORY_CARE_PROVIDER_SITE_OTHER): Payer: Medicaid Other | Admitting: Licensed Clinical Social Worker

## 2021-04-10 ENCOUNTER — Other Ambulatory Visit: Payer: Self-pay | Admitting: Critical Care Medicine

## 2021-04-10 ENCOUNTER — Telehealth (HOSPITAL_COMMUNITY): Payer: Self-pay | Admitting: *Deleted

## 2021-04-10 DIAGNOSIS — Z7689 Persons encountering health services in other specified circumstances: Secondary | ICD-10-CM | POA: Diagnosis not present

## 2021-04-10 DIAGNOSIS — F4310 Post-traumatic stress disorder, unspecified: Secondary | ICD-10-CM

## 2021-04-10 DIAGNOSIS — F339 Major depressive disorder, recurrent, unspecified: Secondary | ICD-10-CM

## 2021-04-10 MED ORDER — MIRTAZAPINE 15 MG PO TABS: 15.0000 mg | ORAL_TABLET | Freq: Every day | ORAL | 0 refills | Status: DC

## 2021-04-10 NOTE — Plan of Care (Signed)
Pt decreased PHQ-9 by 2 point in today's session ?

## 2021-04-10 NOTE — Telephone Encounter (Signed)
Fax request from pharmacy for rx of Mirtazapine 15 mg. Patient should be out as she missed her last appt with provider on 3/9 and has her future appt with Dr Renaldo Fiddler on 05/02/21. Will forward this request to the Dr.  ?

## 2021-04-10 NOTE — Telephone Encounter (Signed)
Received message from patient's pharmacy that patient needed a refill of her Remeron because she missed her last appointment with me.  She has an appointment rescheduled we me for 05/02/21.  A one month refill was sent. ? ? ?Sent: ?Remeron 15 mg: 30 tablets, 0 refills ? ? ?Arna Snipe MD ?Resident ? ?

## 2021-04-10 NOTE — Addendum Note (Signed)
Addended by: Lauro Franklin on: 04/10/2021 02:21 PM ? ? Modules accepted: Orders ? ?

## 2021-04-10 NOTE — Progress Notes (Signed)
? ?  THERAPIST PROGRESS NOTE ? ?Session Time: 40  ? ?Participation Level: Active ? ?Behavioral Response: CasualAlertAnxious, Depressed, and Dysphoric ? ?Type of Therapy: Individual Therapy ? ?Treatment Goals addressed: LTG: Anea WILL SCORE LESS THAN 10 ON THE PATIENT HEALTH QUESTIONNAIRE  ?(PHQ-9) ? ?ProgressTowards Goals: Progressing ? ?Interventions: CBT, Motivational Interviewing, and Supportive ? ?Summary: Lydia Vasquez is a 61 y.o. female who presents with depressed, anxious, and tearful mood\affect.  Patient was pleasant, cooperative, maintained good eye contact.  She engaged well in therapy session and was dressed casually. ? Patient reports primary stressors is grief and loss for her mother.  Patient reports that she has multiple deaths in her family but the main one that she has trouble with closure with is with her mother.  Patient reports "my mother was my shield".  Patient reports that for the first year of all of her mother's death patient would visit her grave site multiple times per week.  Patient has since tried to decrease the amount of times going to her mother's grave site.  LCSW stated new goal and objective for attending gravesite 1 time monthly maximum.  Patient was overwhelmed in session and was tearful throughout.  LCSW utilize grounding techniques for "5, 4, 3, 2, 1 grounding technique".  Which help decrease patient's  anxiety.   ? ?Suicidal/Homicidal: Yeswithout intent/plan ? ?Therapist Response:  ? ? Intervention/Plan: LCSW administered GAD-7.  Patient maxed out on her GAD-7 score.  Goal\objective is below 5.  LCSW administered a PHQ-9 patient decrease score from a 25 to a 23 patient's goal is below a 10.  LCSW taught patient grounding techniques as stated above.  LCSW went over scores using cognitive behavioral education.  LCSW explained the importance of taking medications as prescribed and following up with medication provider.  LCSW referred patient to [facility] for group  therapy and peers support patient to follow-up 1 time in the next month.  ? ?Plan: Return again in 4 weeks. ? ?Diagnosis: Major depression, recurrent, chronic (HCC) ? ?PTSD (post-traumatic stress disorder) ? ?Collaboration of Care: Other [facility] ? ?Patient/Guardian was advised Release of Information must be obtained prior to any record release in order to collaborate their care with an outside provider. Patient/Guardian was advised if they have not already done so to contact the registration department to sign all necessary forms in order for [place] to release information regarding their care.  ? ?Consent: Patient/Guardian gives verbal consent for treatment and assignment of benefits for services provided during this visit. Patient/Guardian expressed understanding and agreed to proceed.  ? ?Weber Cooks, LCSW ?04/10/2021 ? ?

## 2021-04-16 ENCOUNTER — Encounter: Payer: Medicaid Other | Admitting: Neurology

## 2021-04-20 DIAGNOSIS — Z419 Encounter for procedure for purposes other than remedying health state, unspecified: Secondary | ICD-10-CM | POA: Diagnosis not present

## 2021-05-02 ENCOUNTER — Encounter (HOSPITAL_COMMUNITY): Payer: Self-pay | Admitting: Student in an Organized Health Care Education/Training Program

## 2021-05-02 ENCOUNTER — Ambulatory Visit (INDEPENDENT_AMBULATORY_CARE_PROVIDER_SITE_OTHER): Payer: Medicaid Other | Admitting: Student in an Organized Health Care Education/Training Program

## 2021-05-02 VITALS — BP 145/92 | HR 69 | Ht 68.0 in | Wt 294.0 lb

## 2021-05-02 DIAGNOSIS — F339 Major depressive disorder, recurrent, unspecified: Secondary | ICD-10-CM | POA: Diagnosis not present

## 2021-05-02 DIAGNOSIS — F4310 Post-traumatic stress disorder, unspecified: Secondary | ICD-10-CM

## 2021-05-02 DIAGNOSIS — Z7689 Persons encountering health services in other specified circumstances: Secondary | ICD-10-CM | POA: Diagnosis not present

## 2021-05-02 MED ORDER — QUETIAPINE FUMARATE 50 MG PO TABS: 50.0000 mg | ORAL_TABLET | Freq: Every day | ORAL | 1 refills | Status: DC

## 2021-05-02 MED ORDER — MIRTAZAPINE 30 MG PO TABS: 30.0000 mg | ORAL_TABLET | Freq: Every day | ORAL | 1 refills | Status: DC

## 2021-05-02 NOTE — Progress Notes (Signed)
BH MD/PA/NP OP Progress Note ? ?05/02/2021 3:00 PM ?Lydia Vasquez  ?MRN:  [id number] ? ?Chief Complaint:  ?Chief Complaint  ?Patient presents with  ? Medication Management  ?  F/U  ? ?HPI:  ?Lydia Vasquez is a 61 yr old female who presents for follow up and medication management.  PPHx is significant for MDD, Recurrent, Anxiety, Insomnia.  No prior hospitalizations or suicide attempts. ? ?Reports acute worsening of her symptoms since her last appointment.  She states that in mid February her cousin passed away from a heart attack.  She states he was 3046 and that his mother (her aunt) was the one to find him and that he was her only son and that also his 2 kids were in the house at the same time.  She states that she has had so many losses and with this she now thinks who will be next.  She states that her brother is significantly ill stating he needs a new heart and kidneys.  She also reports that a cousin who passed away his wife needs a kidney transplant and while they found a donor she currently has an ulcer on her foot that needs to heal before she can undergo the surgery.  She states that she has been more afraid and easily startled recently.  She states that occasionally her phone ringing will cause her to jump. ? ?She reports that her sleep continues to be poor due to racing thoughts.  She states she has had SI since her last appointment.  She states that they are brief episodes never with a plan or intent and that she is able to force these thoughts out but that they have happened.  She reports her last episode of this occurred over a week ago. ? ?She reports no SI, HI, or AVH today.  She reports no side effects from the Remeron. ? ?Discussed with her increasing her Remeron further for symptom control.  Also discussed starting Seroquel for augmentation of her Remeron since she has been on it in the past without any side effects.  Discussed with her what to do in the event of a future crisis.  Discussed that [facility]  [facility], [facility], go to the nearest ED, or call 911 or [phone].  Discussed with her that she can also call the clinic for an appointment sooner if she has a crisis.  She reported understanding and had no concerns. ? ?Visit Diagnosis:  ?  ICD-10-CM   ?1. Major depression, recurrent, chronic (HCC)  F33.9 mirtazapine (REMERON) 30 MG tablet  ?  QUEtiapine (SEROQUEL) 50 MG tablet  ?  ?2. PTSD (post-traumatic stress disorder)  F43.10 mirtazapine (REMERON) 30 MG tablet  ?  QUEtiapine (SEROQUEL) 50 MG tablet  ?  ? ? ?Past Psychiatric History: MDD, Recurrent, Anxiety, Insomnia.  No prior hospitalizations or suicide attempts. ? ?Past Medical History:  ?Past Medical History:  ?Diagnosis Date  ? Anxiety   ? Depression   ? Hyperlipidemia   ? Hypertension   ? Ventricular tachycardia (HCC) 06/11/2020  ?  ?Past Surgical History:  ?Procedure Laterality Date  ? ABDOMINAL HYSTERECTOMY    ? CESAREAN SECTION    ? ? ?Family Psychiatric History: Maternal Cousin- Schizophrenia or Bipolar Disorder ?No known history of substance abuse or Suicide attempts or completion. ? ?Family History:  ?Family History  ?Problem Relation Age of Onset  ? Stroke Mother   ? Dementia Mother   ? ? ?Social History:  ?Social History  ? ?  Socioeconomic History  ? Marital status: Single  ?  Spouse name: Not on file  ? Number of children: Not on file  ? Years of education: Not on file  ? Highest education level: Not on file  ?Occupational History  ? Not on file  ?Tobacco Use  ? Smoking status: Former  ?  Types: Cigarettes  ? Smokeless tobacco: Never  ?Vaping Use  ? Vaping Use: Never used  ?Substance and Sexual Activity  ? Alcohol use: Not Currently  ? Drug use: Never  ? Sexual activity: Not on file  ?Other Topics Concern  ? Not on file  ?Social History Narrative  ? Not on file  ? ?Social Determinants of Health  ? ?Financial Resource Strain: Low Risk   ? Difficulty of Paying Living Expenses: Not hard at all  ?Food Insecurity: Food Insecurity Present  ? Worried  About Programme researcher, broadcasting/film/video in the Last Year: Sometimes true  ? Ran Out of Food in the Last Year: Sometimes true  ?Transportation Needs: Unmet Transportation Needs  ? Lack of Transportation (Medical): Yes  ? Lack of Transportation (Non-Medical): Yes  ?Physical Activity: Inactive  ? Days of Exercise per Week: 0 days  ? Minutes of Exercise per Session: 0 min  ?Stress: Stress Concern Present  ? Feeling of Stress : Very much  ?Social Connections: Socially Isolated  ? Frequency of Communication with Friends and Family: Once a week  ? Frequency of Social Gatherings with Friends and Family: More than three times a week  ? Attends Religious Services: Never  ? Active Member of Clubs or Organizations: No  ? Attends Banker Meetings: Never  ? Marital Status: Never married  ? ? ?Allergies: No Known Allergies ? ?Metabolic Disorder Labs: ?No results found for: HGBA1C, MPG ?No results found for: PROLACTIN ?Lab Results  ?Component Value Date  ? CHOL 192 10/26/2019  ? TRIG 332 (H) 10/26/2019  ? HDL <10 (L) 10/26/2019  ? CHOLHDL NOT CALCULATED 10/26/2019  ? VLDL 66 (H) 10/26/2019  ? LDLCALC NOT CALCULATED 10/26/2019  ? ?No results found for: TSH ? ?Therapeutic Level Labs: ?No results found for: LITHIUM ?No results found for: VALPROATE ?No components found for:  CBMZ ? ?Current Medications: ?Current Outpatient Medications  ?Medication Sig Dispense Refill  ? B Complex-C-Folic Acid (B COMPLEX-VITAMIN C-FOLIC ACID) 1 MG tablet Take 1 tablet by mouth daily with breakfast. 30 tablet 2  ? famotidine (PEPCID) 20 MG tablet TAKE 1 TABLET(20 MG) BY MOUTH TWICE DAILY 180 tablet 0  ? metoprolol tartrate (LOPRESSOR) 25 MG tablet TAKE 1 TABLET(25 MG) BY MOUTH TWICE DAILY 180 tablet 0  ? QUEtiapine (SEROQUEL) 50 MG tablet Take 1 tablet (50 mg total) by mouth at bedtime. 30 tablet 1  ? mirtazapine (REMERON) 30 MG tablet Take 1 tablet (30 mg total) by mouth at bedtime. 30 tablet 1  ? ?No current facility-administered medications for this  visit.  ? ? ? ?Musculoskeletal: ?Strength & Muscle Tone: within normal limits ?Gait & Station: normal ?Patient leans: N/A ? ?Psychiatric Specialty Exam: ?Review of Systems  ?Respiratory:  Negative for shortness of breath.   ?Cardiovascular:  Negative for chest pain.  ?Gastrointestinal:  Negative for abdominal pain, constipation, diarrhea, nausea and vomiting.  ?Neurological:  Negative for weakness and headaches.  ?Psychiatric/Behavioral:  Positive for dysphoric mood and sleep disturbance. Negative for agitation, hallucinations, self-injury and suicidal ideas. The patient is nervous/anxious.    ?Blood pressure (!) 145/92, pulse 69, height 5\' 8"  (1.727 m), weight  294 lb (133.4 kg).Body mass index is 44.7 kg/m?.  ?General Appearance: Casual and Fairly Groomed  ?Eye Contact:  Good  ?Speech:  Clear and Coherent and Normal Rate  ?Volume:  Normal  ?Mood:  Anxious and Depressed  ?Affect:  Congruent, Depressed, and occasionally tearful during interview  ?Thought Process:  Coherent and Goal Directed  ?Orientation:  Full (Time, Place, and Person)  ?Thought Content: Logical  ?No SI, HI, or AVH. No Paranoia, Ideas of Reference, or First Rank symptoms.  ?Suicidal Thoughts:  No  ?Homicidal Thoughts:  No  ?Memory:  Immediate;   Good ?Recent;   Good  ?Judgement:  Fair  ?Insight:  Fair  ?Psychomotor Activity:  Normal  ?Concentration:  Concentration: Good and Attention Span: Good  ?Recall:  Good  ?Fund of Knowledge: Good  ?Language: Good  ?Akathisia:  Negative  ?Handed:  Right  ?AIMS (if indicated): not done  ?Assets:  Communication Skills ?Desire for Improvement ?Resilience  ?ADL's:  Intact  ?Cognition: WNL  ?Sleep:  Poor  ? ?Screenings: ?GAD-7   ? ?Flowsheet Row Counselor from 04/10/2021 in [facility] Office Visit from 04/08/2021 in [facility] from 10/10/2020 in [facility] Office Visit from 08/13/2020 in [facility] Office Visit from 06/11/2020 in [facility]  ?Total GAD-7 Score 21 20 21 18 19   ? ?  ? ?PHQ2-9   ? ?Flowsheet Row Counselor from 04/10/2021 in

## 2021-05-20 DIAGNOSIS — Z419 Encounter for procedure for purposes other than remedying health state, unspecified: Secondary | ICD-10-CM | POA: Diagnosis not present

## 2021-05-29 ENCOUNTER — Telehealth (HOSPITAL_COMMUNITY): Payer: Self-pay | Admitting: Licensed Clinical Social Worker

## 2021-05-29 ENCOUNTER — Ambulatory Visit (HOSPITAL_COMMUNITY): Payer: Medicaid Other | Admitting: Licensed Clinical Social Worker

## 2021-05-29 NOTE — Telephone Encounter (Signed)
LCSW received phone call from patient stating that she was not feeling well for today and could not attend today's in person session.  LCSW did speak with patient about switching to virtual but patient does not feel she has the electronic access to do a virtual appointment.  Patient also reports that she really is not feeling well with a sore throat and cough.  Message was forwarded to administrative staff ?

## 2021-06-05 ENCOUNTER — Other Ambulatory Visit: Payer: Self-pay | Admitting: Critical Care Medicine

## 2021-06-06 ENCOUNTER — Encounter (HOSPITAL_COMMUNITY): Payer: Self-pay | Admitting: Student in an Organized Health Care Education/Training Program

## 2021-06-06 ENCOUNTER — Ambulatory Visit (INDEPENDENT_AMBULATORY_CARE_PROVIDER_SITE_OTHER): Payer: Medicaid Other | Admitting: Student in an Organized Health Care Education/Training Program

## 2021-06-06 VITALS — BP 128/77 | HR 95 | Ht 68.0 in | Wt 295.0 lb

## 2021-06-06 DIAGNOSIS — F411 Generalized anxiety disorder: Secondary | ICD-10-CM

## 2021-06-06 DIAGNOSIS — F4310 Post-traumatic stress disorder, unspecified: Secondary | ICD-10-CM

## 2021-06-06 DIAGNOSIS — Z7689 Persons encountering health services in other specified circumstances: Secondary | ICD-10-CM | POA: Diagnosis not present

## 2021-06-06 DIAGNOSIS — F339 Major depressive disorder, recurrent, unspecified: Secondary | ICD-10-CM

## 2021-06-06 MED ORDER — HYDROXYZINE HCL 25 MG PO TABS: 25.0000 mg | ORAL_TABLET | Freq: Three times a day (TID) | ORAL | 0 refills | Status: DC | PRN

## 2021-06-06 MED ORDER — QUETIAPINE FUMARATE 100 MG PO TABS: 100.0000 mg | ORAL_TABLET | Freq: Every day | ORAL | 0 refills | Status: DC

## 2021-06-06 MED ORDER — MIRTAZAPINE 30 MG PO TABS: 30.0000 mg | ORAL_TABLET | Freq: Every day | ORAL | 0 refills | Status: DC

## 2021-06-06 NOTE — Progress Notes (Signed)
[facility] MD/PA/NP OP Progress Note  06/06/2021 1:41 PM Lydia Vasquez  MRN:  [id number]  Chief Complaint:  Chief Complaint  Patient presents with   Medication Management   HPI:  Lydia Vasquez is a 61 yr old female who presents for follow up and medication management.  PPHx is significant for MDD, Recurrent, Anxiety, Insomnia.  No prior hospitalizations or suicide attempts.   She reports that she has had some improvement since our last visit.  She reports that her sleep has had some improvement since starting the Seroquel.  She reports she does not stay asleep long enough but it is easier to fall asleep.  She reports she still has significant anxiety over the multiple traumas and losses she has had.  She reports she had some dizziness when first taking the Seroquel but that it is better now.  She reports that Mother's Day was very hard for her.  She reports that the family wanted to visit the grave to place flowers.  She reports she knew it would be hard for her but rest of the family insisted.  She states it is very difficult for her because she is the one who help take care of her mother and that they are unable to fully appreciate everything she went through.  Provided supportive therapy about remembering both good and bad times as this was truly the relationship between her and her mother.  She reports that she does do this by sometimes watching videos of her and her mother and it is helpful.  She also reports another recent stressor was when she went to her PCP and was told that the numbness in her legs is most likely permanent.  She reports that this is made walking sometimes difficult because she cannot feel her feet.  She states that this has caused her a lot of distress and concerns over what the future might hold.  When asked if she had any more thoughts of SI as she did during her last appointment she confirms that sometimes they will just hit her.  She states that whenever these thoughts do  appear she immediately thinks of her family.  She states she knows that she now has to be the matriarch of the family the "shield for them".  She reports she knows she needs to be here for them and so could never hurt herself.  Discussed with her what to do in the event of a future crisis.  Discussed that she can return to the [facility], go to [facility], go to the nearest ED, or call 911 or [phone].   She reported understanding and had no concerns.  Discussed further increasing her Seroquel as this will help further augment her Remeron and should further improve her sleep.  Also discussed with her that it was worth trialing hydroxyzine.  Discussed with her that this medication is not habit-forming nor abusable but can help with acute spikes of anxiety.  She was agreeable to this and had no questions.  She reports no SI, HI, or AVH.  She reports no other concerns at present.   Visit Diagnosis:    ICD-10-CM   1. Major depression, recurrent, chronic (HCC)  F33.9 QUEtiapine (SEROQUEL) 100 MG tablet    mirtazapine (REMERON) 30 MG tablet    2. PTSD (post-traumatic stress disorder)  F43.10 QUEtiapine (SEROQUEL) 100 MG tablet    mirtazapine (REMERON) 30 MG tablet    3. GAD (generalized anxiety disorder)  F41.1 hydrOXYzine (ATARAX) 25 MG tablet  Past Psychiatric History: MDD, Recurrent, Anxiety, Insomnia.  No prior hospitalizations or suicide attempts.  Past Medical History:  Past Medical History:  Diagnosis Date   Anxiety    Depression    Hyperlipidemia    Hypertension    Ventricular tachycardia ([place]) 06/11/2020    Past Surgical History:  Procedure Laterality Date   ABDOMINAL HYSTERECTOMY     CESAREAN SECTION      Family Psychiatric History: Maternal Cousin- Schizophrenia or Bipolar Disorder No known history of substance abuse or Suicide attempts or completion.  Family History:  Family History  Problem Relation Age of Onset   Stroke Mother    Dementia Mother     Social History:  Social  History   Socioeconomic History   Marital status: Single    Spouse name: Not on file   Number of children: Not on file   Years of education: Not on file   Highest education level: Not on file  Occupational History   Not on file  Tobacco Use   Smoking status: Former    Types: Cigarettes   Smokeless tobacco: Never  Vaping Use   Vaping Use: Never used  Substance and Sexual Activity   Alcohol use: Not Currently   Drug use: Never   Sexual activity: Not on file  Other Topics Concern   Not on file  Social History Narrative   Not on file   Social Determinants of Health   Financial Resource Strain: Low Risk    Difficulty of Paying Living Expenses: Not hard at all  Food Insecurity: Food Insecurity Present   Worried About Charity fundraiser in the Last Year: Sometimes true   Arboriculturist in the Last Year: Sometimes true  Transportation Needs: Public librarian (Medical): Yes   Lack of Transportation (Non-Medical): Yes  Physical Activity: Inactive   Days of Exercise per Week: 0 days   Minutes of Exercise per Session: 0 min  Stress: Stress Concern Present   Feeling of Stress : Very much  Social Connections: Socially Isolated   Frequency of Communication with Friends and Family: Once a week   Frequency of Social Gatherings with Friends and Family: More than three times a week   Attends Religious Services: Never   Marine scientist or Organizations: No   Attends Archivist Meetings: Never   Marital Status: Never married    Allergies: No Known Allergies  Metabolic Disorder Labs: No results found for: HGBA1C, MPG No results found for: PROLACTIN Lab Results  Component Value Date   CHOL 192 10/26/2019   TRIG 332 (H) 10/26/2019   HDL <10 (L) 10/26/2019   CHOLHDL NOT CALCULATED 10/26/2019   VLDL 66 (H) 10/26/2019   [place] NOT CALCULATED 10/26/2019   No results found for: TSH  Therapeutic Level Labs: No results found  for: LITHIUM No results found for: VALPROATE No components found for:  CBMZ  Current Medications: Current Outpatient Medications  Medication Sig Dispense Refill   hydrOXYzine (ATARAX) 25 MG tablet Take 1 tablet (25 mg total) by mouth 3 (three) times daily as needed. 60 tablet 0   B Complex-C-Folic Acid (B COMPLEX-VITAMIN C-FOLIC ACID) 1 MG tablet Take 1 tablet by mouth daily with breakfast. 30 tablet 2   famotidine (PEPCID) 20 MG tablet TAKE 1 TABLET(20 MG) BY MOUTH TWICE DAILY 180 tablet 0   metoprolol tartrate (LOPRESSOR) 25 MG tablet TAKE 1 TABLET(25 MG) BY MOUTH TWICE DAILY 180 tablet 0  mirtazapine (REMERON) 30 MG tablet Take 1 tablet (30 mg total) by mouth at bedtime. 30 tablet 0   QUEtiapine (SEROQUEL) 100 MG tablet Take 1 tablet (100 mg total) by mouth at bedtime. 30 tablet 0   No current facility-administered medications for this visit.     Musculoskeletal: Strength & Muscle Tone: within normal limits Gait & Station: normal Patient leans: N/A  Psychiatric Specialty Exam: Review of Systems  Respiratory:  Negative for shortness of breath.   Cardiovascular:  Negative for chest pain.  Gastrointestinal:  Negative for abdominal pain, constipation, diarrhea, nausea and vomiting.  Neurological:  Negative for dizziness, weakness and headaches.  Psychiatric/Behavioral:  Positive for sleep disturbance. Negative for hallucinations, self-injury and suicidal ideas. The patient is nervous/anxious.    Blood pressure 128/77, pulse 95, height 5\' 8"  (1.727 m), weight 295 lb (133.8 kg), SpO2 98 %.Body mass index is 44.85 kg/m.  General Appearance: Casual and Fairly Groomed  Eye Contact:  Good  Speech:  Clear and Coherent and Normal Rate  Volume:  Normal  Mood:  Anxious and Dysphoric  Affect:  Congruent and tearful when discussing her mother  Thought Process:  Coherent and Goal Directed  Orientation:  Full (Time, Place, and Person)  Thought Content: Logical   Suicidal Thoughts:  No   Homicidal Thoughts:  No  Memory:  Immediate;   Good Recent;   Good  Judgement:  Good  Insight:  Good  Psychomotor Activity:  Normal  Concentration:  Concentration: Good and Attention Span: Good  Recall:  Good  Fund of Knowledge: Good  Language: Good  Akathisia:  Negative  Handed:  Right  AIMS (if indicated): done AIMS= 0 No Cpgwheeling or Rigidity Present  Assets:  Communication Skills Desire for Improvement Housing Resilience Social Support  ADL's:  Intact  Cognition: WNL  Sleep:  Fair   Screenings: GAD-7    Health and safety inspector from 04/10/2021 in [facility] Office Visit from 04/08/2021 in [place] from 10/10/2020 in [place] Office Visit from 08/13/2020 in [place] Office Visit from 06/11/2020 in [place]  Total GAD-7 Score 21 20 21 18 19       PHQ2-9    Flowsheet Row Counselor from 04/10/2021 in [facility] Office Visit from 04/08/2021 in [place] Counselor from 02/12/2021 in [place] from 10/10/2020 in [place] Office Visit from 08/13/2020 in [place]  PHQ-2 Total Score 6 6 4 6 5   PHQ-9 Total Score 23 23 25 19 17       Flowsheet Row Counselor from 04/10/2021 in [facility] Counselor from 02/12/2021 in [place] from 10/10/2020 in [place] and Plan:  Danaly has had some improvement with the last increase in her Remeron and starting Seroquel.  As she continues to have significant anxiety and issues with sleep we will  further increase her Seroquel.  To help with acute anxiety we will start PRN Hydroxyzine.  She will return for follow-up in approximately 4 weeks.    MDD, Recurrent, Chronic  PTSD  GAD  Insomnia : -Continue Remeron 30 mg QHS for  depression, anxiety, and sleep -Increase Seroquel to 100 mg QHS for depression, anxiety, and sleep -Start Hydroxyzine 25 mg TID PRN for anxiety   Collaboration of Care: Case Discussed with Supervising Attending Dr. Dwyane Dee   Patient/Guardian was advised Release of Information must be obtained prior to any record release in order to collaborate their care with an outside provider. Patient/Guardian was advised if they have not already done so to contact the registration department to sign all necessary forms in order for [place] to release information regarding their care.   Consent: Patient/Guardian gives verbal consent for treatment and assignment of benefits for services provided during this visit. Patient/Guardian expressed understanding and agreed to proceed.    Briant Cedar, MD 06/06/2021, 1:41 PM

## 2021-06-19 ENCOUNTER — Encounter (HOSPITAL_COMMUNITY): Payer: Self-pay

## 2021-06-19 ENCOUNTER — Ambulatory Visit (INDEPENDENT_AMBULATORY_CARE_PROVIDER_SITE_OTHER): Payer: Medicaid Other | Admitting: Licensed Clinical Social Worker

## 2021-06-19 DIAGNOSIS — F339 Major depressive disorder, recurrent, unspecified: Secondary | ICD-10-CM | POA: Diagnosis not present

## 2021-06-19 DIAGNOSIS — F4310 Post-traumatic stress disorder, unspecified: Secondary | ICD-10-CM

## 2021-06-19 DIAGNOSIS — Z7689 Persons encountering health services in other specified circumstances: Secondary | ICD-10-CM | POA: Diagnosis not present

## 2021-06-19 NOTE — Progress Notes (Signed)
   THERAPIST PROGRESS NOTE  Session Time: 38  Participation Level: Active  Behavioral Response: CasualAlertAnxious and Depressed  Type of Therapy: Individual Therapy  Treatment Goals addressed: decrease PHQ-9 below 10   ProgressTowards Goals: Progressing  Interventions: CBT and Motivational Interviewing  Summary: Lydia Vasquez is a 61 y.o. female who presents with depressed and anxious mood\affect.  Patient was pleasant, cooperative, maintained good eye contact.  She engaged well in therapy session and was dressed casually.  LCSW started off session talking about the standards of therapy.  Patient has 1 no-show in the past 5 months and 1 left without being seen.  LCSW stated that the standard is being seen 1 time monthly and patient has only completed 1 comprehensive clinical assessment and 1 follow-up therapy session since being seen on January 24 for initial assessment.  LCSW spoke with patient about the standards of therapy moving forward and patient was agreeable to be seen at least 1 time monthly.  Patient reports stressors as illness.  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  Patient reports that she has been referred to a pain management doctor and is following up with specialists for both her kidney function and her neuropathy.  Suicidal/Homicidal: Nowithout intent/plan  Therapist Response:    Intervention/Plan: LCSW administered GAD-7.  LCSW administered PHQ-9.  Patient educated on grounding technique for "22979 grounding technique".  LCSW provided education materials on grounding technique in session.  LCSW used supportive therapy for praise and encouragement.  LCSW used solution focused therapy for scaling.    Plan: Return again in 4 weeks.  Diagnosis: Major depression, recurrent, chronic (HCC)  PTSD (post-traumatic stress disorder)  Collaboration of Care: Other none in today's session   Patient/Guardian was advised  Release of Information must be obtained prior to any record release in order to collaborate their care with an outside provider. Patient/Guardian was advised if they have not already done so to contact the registration department to sign all necessary forms in order for [place] to release information regarding their care.   Consent: Patient/Guardian gives verbal consent for treatment and assignment of benefits for services provided during this visit. Patient/Guardian expressed understanding and agreed to proceed.   Weber Cooks, LCSW 06/19/2021

## 2021-06-19 NOTE — Plan of Care (Signed)
  Problem: Depression CCP Problem  1 MDD Goal: LTG: Luzelena WILL SCORE LESS THAN 10 ON THE PATIENT HEALTH QUESTIONNAIRE (PHQ-9) Outcome: Progressing Goal: STG: Reduce overall depression score by a minimum of 25% on the Patient Health Questionnaire (PHQ-9) or the Montgomery-Asberg Depression Rating Scale (MADRS) Outcome: Progressing

## 2021-06-20 DIAGNOSIS — Z419 Encounter for procedure for purposes other than remedying health state, unspecified: Secondary | ICD-10-CM | POA: Diagnosis not present

## 2021-06-26 ENCOUNTER — Telehealth (HOSPITAL_COMMUNITY): Payer: Self-pay | Admitting: *Deleted

## 2021-06-26 DIAGNOSIS — F339 Major depressive disorder, recurrent, unspecified: Secondary | ICD-10-CM

## 2021-06-26 DIAGNOSIS — F4310 Post-traumatic stress disorder, unspecified: Secondary | ICD-10-CM

## 2021-06-26 MED ORDER — QUETIAPINE FUMARATE 100 MG PO TABS: 100.0000 mg | ORAL_TABLET | Freq: Every day | ORAL | 0 refills | Status: DC

## 2021-06-26 NOTE — Telephone Encounter (Signed)
Rx REFILL REQUEST : QUEtiapine (SEROQUEL) 100 MG tablet Take 1 tablet (100 mg total) by mouth at bedtime

## 2021-06-26 NOTE — Telephone Encounter (Signed)
Contacted that patient needed refill of Seroquel.  1 month refill sent to [facility] on [address].   Medications sent- Seroquel 100 mg QHS: 30 tablets with 0 refills    Arna Snipe MD Resident

## 2021-06-26 NOTE — Addendum Note (Signed)
Addended by: Briant Cedar on: 06/26/2021 07:57 AM   Modules accepted: Orders, Level of Service

## 2021-07-02 DIAGNOSIS — Z7689 Persons encountering health services in other specified circumstances: Secondary | ICD-10-CM | POA: Diagnosis not present

## 2021-07-04 ENCOUNTER — Encounter (HOSPITAL_COMMUNITY): Payer: Self-pay | Admitting: Student in an Organized Health Care Education/Training Program

## 2021-07-04 ENCOUNTER — Ambulatory Visit (INDEPENDENT_AMBULATORY_CARE_PROVIDER_SITE_OTHER): Payer: Medicaid Other | Admitting: Student in an Organized Health Care Education/Training Program

## 2021-07-04 VITALS — BP 127/81 | HR 85 | Ht 68.0 in | Wt 298.0 lb

## 2021-07-04 DIAGNOSIS — F4310 Post-traumatic stress disorder, unspecified: Secondary | ICD-10-CM

## 2021-07-04 DIAGNOSIS — F339 Major depressive disorder, recurrent, unspecified: Secondary | ICD-10-CM | POA: Diagnosis not present

## 2021-07-04 DIAGNOSIS — F411 Generalized anxiety disorder: Secondary | ICD-10-CM | POA: Diagnosis not present

## 2021-07-04 DIAGNOSIS — Z7689 Persons encountering health services in other specified circumstances: Secondary | ICD-10-CM | POA: Diagnosis not present

## 2021-07-04 MED ORDER — HYDROXYZINE HCL 50 MG PO TABS: 50.0000 mg | ORAL_TABLET | Freq: Three times a day (TID) | ORAL | 0 refills | Status: AC | PRN

## 2021-07-04 MED ORDER — MIRTAZAPINE 30 MG PO TABS: 30.0000 mg | ORAL_TABLET | Freq: Every day | ORAL | 0 refills | Status: DC

## 2021-07-04 MED ORDER — QUETIAPINE FUMARATE 100 MG PO TABS: 100.0000 mg | ORAL_TABLET | Freq: Every day | ORAL | 0 refills | Status: DC

## 2021-07-04 NOTE — Progress Notes (Signed)
BH MD/PA/NP OP Progress Note  07/04/2021 3:00 PM Lydia Vasquez  MRN:  [id number]  Chief Complaint:  Chief Complaint  Patient presents with   Medication Management    F/U   HPI:  Lydia Vasquez is a 61 yr old female who presents for follow up and medication management.  PPHx is significant for MDD, Recurrent, Anxiety, Insomnia.  No prior hospitalizations or suicide attempts.  She reports that she has not been doing good these last few days as there was another tragedy in the family.  She reports that on Sunday another one of her cousins passed away from a heart attack.  She states that he feels like they had just finished burying the other cousin who passed away in April 09, 2022 and now this happened.  She reports that she does not think she can handle going to the funeral.  Discussed with her that if she needs to stay home for her own wellbeing then she should do that and she should listen to her body in regards to this.  She states that her daughter had been telling her this and so she will plan to do this.  She states that lately her sleep has been significantly impacted but that the Seroquel is helping her get some sleep and reports that it is more than she would be getting having received this news.  She reports her appetite has been both up and down.  She reports that she does have something to look forward to.  She states her daughter is due to give birth to a son next month.  She states she will become a grandmother for the first time and she has been looking forward to this and has already prepared a bassinet and things in her house for the baby.  She states she is excited for this and cannot wait.  She reports that given the death of her cousin she has been thinking of death.  However, she states whenever these thoughts started happening she immediately begins thinking of her family and everything she has to look forward to (birth of her grandson).  She reports no SI, HI, or AVH.  Discussed with  her what to do in the event of a future crisis.  Discussed that she can return to the [facility], go to [facility], go to the nearest ED, or call 911 or [phone].   She reported understanding and had no concerns.    Visit Diagnosis:    ICD-10-CM   1. Major depression, recurrent, chronic (HCC)  F33.9 mirtazapine (REMERON) 30 MG tablet    QUEtiapine (SEROQUEL) 100 MG tablet    2. PTSD (post-traumatic stress disorder)  F43.10 mirtazapine (REMERON) 30 MG tablet    QUEtiapine (SEROQUEL) 100 MG tablet    3. GAD (generalized anxiety disorder)  F41.1 hydrOXYzine (ATARAX) 50 MG tablet      Past Psychiatric History: MDD, Recurrent, Anxiety, Insomnia.  No prior hospitalizations or suicide attempts.  Past Medical History:  Past Medical History:  Diagnosis Date   Anxiety    Depression    Hyperlipidemia    Hypertension    Ventricular tachycardia (HCC) 06/11/2020    Past Surgical History:  Procedure Laterality Date   ABDOMINAL HYSTERECTOMY     CESAREAN SECTION      Family Psychiatric History: Maternal Cousin- Schizophrenia or Bipolar Disorder No known history of substance abuse or Suicide attempts or completion  Family History:  Family History  Problem Relation Age of Onset   Stroke Mother  Dementia Mother     Social History:  Social History   Socioeconomic History   Marital status: Single    Spouse name: Not on file   Number of children: Not on file   Years of education: Not on file   Highest education level: Not on file  Occupational History   Not on file  Tobacco Use   Smoking status: Former    Types: Cigarettes   Smokeless tobacco: Never  Vaping Use   Vaping Use: Never used  Substance and Sexual Activity   Alcohol use: Not Currently   Drug use: Never   Sexual activity: Not on file  Other Topics Concern   Not on file  Social History Narrative   Not on file   Social Determinants of Health   Financial Resource Strain: Low Risk  (02/12/2021)   Overall Financial Resource Strain  (CARDIA)    Difficulty of Paying Living Expenses: Not hard at all  Food Insecurity: Food Insecurity Present (02/12/2021)   Hunger Vital Sign    Worried About Running Out of Food in the Last Year: Sometimes true    Ran Out of Food in the Last Year: Sometimes true  Transportation Needs: Unmet Transportation Needs (02/12/2021)   PRAPARE - Transportation    Lack of Transportation (Medical): Yes    Lack of Transportation (Non-Medical): Yes  Physical Activity: Inactive (02/12/2021)   Exercise Vital Sign    Days of Exercise per Week: 0 days    Minutes of Exercise per Session: 0 min  Stress: Stress Concern Present (02/12/2021)   [employer] of Occupational Health - Occupational Stress Questionnaire    Feeling of Stress : Very much  Social Connections: Socially Isolated (02/12/2021)   Social Connection and Isolation Panel [NHANES]    Frequency of Communication with Friends and Family: Once a week    Frequency of Social Gatherings with Friends and Family: More than three times a week    Attends Religious Services: Never    Database administrator or Organizations: No    Attends Engineer, structural: Never    Marital Status: Never married    Allergies: No Known Allergies  Metabolic Disorder Labs: No results found for: "HGBA1C", "MPG" No results found for: "PROLACTIN" Lab Results  Component Value Date   CHOL 192 10/26/2019   TRIG 332 (H) 10/26/2019   HDL <10 (L) 10/26/2019   CHOLHDL NOT CALCULATED 10/26/2019   VLDL 66 (H) 10/26/2019   LDLCALC NOT CALCULATED 10/26/2019   No results found for: "TSH"  Therapeutic Level Labs: No results found for: "LITHIUM" No results found for: "VALPROATE" No results found for: "CBMZ"  Current Medications: Current Outpatient Medications  Medication Sig Dispense Refill   B Complex-C-Folic Acid (B COMPLEX-VITAMIN C-FOLIC ACID) 1 MG tablet Take 1 tablet by mouth daily with breakfast. 30 tablet 2   famotidine (PEPCID) 20 MG tablet TAKE 1  TABLET(20 MG) BY MOUTH TWICE DAILY 180 tablet 0   metoprolol tartrate (LOPRESSOR) 25 MG tablet TAKE 1 TABLET(25 MG) BY MOUTH TWICE DAILY 180 tablet 0   hydrOXYzine (ATARAX) 50 MG tablet Take 1 tablet (50 mg total) by mouth 3 (three) times daily as needed. 60 tablet 0   mirtazapine (REMERON) 30 MG tablet Take 1 tablet (30 mg total) by mouth at bedtime. 30 tablet 0   QUEtiapine (SEROQUEL) 100 MG tablet Take 1 tablet (100 mg total) by mouth at bedtime. 30 tablet 0   No current facility-administered medications for this visit.  Musculoskeletal: Strength & Muscle Tone: within normal limits Gait & Station: normal Patient leans: N/A  Psychiatric Specialty Exam: Review of Systems  Respiratory:  Negative for shortness of breath.   Cardiovascular:  Negative for chest pain.  Gastrointestinal:  Negative for abdominal pain, constipation, diarrhea, nausea and vomiting.  Neurological:  Negative for dizziness and weakness.  Psychiatric/Behavioral:  Positive for dysphoric mood and sleep disturbance. Negative for hallucinations, self-injury and suicidal ideas. The patient is nervous/anxious.     Blood pressure 127/81, pulse 85, height 5\' 8"  (1.727 m), weight 298 lb (135.2 kg).Body mass index is 45.31 kg/m.  General Appearance: Casual and Fairly Groomed  Eye Contact:  Fair  Speech:  Clear and Coherent and Normal Rate  Volume:  Normal  Mood:  Anxious and Depressed  Affect:  Depressed and Tearful  Thought Process:  Coherent and Goal Directed  Orientation:  Full (Time, Place, and Person)  Thought Content: Logical   Suicidal Thoughts:  No  Homicidal Thoughts:  No  Memory:  Immediate;   Good Recent;   Good  Judgement:  Good  Insight:  Good  Psychomotor Activity:  Normal  Concentration:  Concentration: Good and Attention Span: Good  Recall:  Good  Fund of Knowledge: Good  Language: Good  Akathisia:  Negative  Handed:  Right  AIMS (if indicated): done No Cogwheeling or Rigidity Present   Assets:  Communication Skills Desire for Improvement Housing Resilience Social Support  ADL's:  Intact  Cognition: WNL  Sleep:   acutely poor   Screenings: GAD-7    from 06/19/2021 in [facility] Counselor from 04/10/2021 in [facility] Office Visit from 04/08/2021 in [facility] Integrated Behavioral Health from 10/10/2020 in [facility] Office Visit from 08/13/2020 in [facility]  Total GAD-7 Score 19 21 20 21 18       PHQ2-9    Flowsheet Row Counselor from 06/19/2021 in [facility] Counselor from 04/10/2021 in [facility] Office Visit from 04/08/2021 in [facility] Counselor from 02/12/2021 in [facility] Integrated Behavioral Health from 10/10/2020 in [facility]  PHQ-2 Total Score 3 6 6 4 6   PHQ-9 Total Score 20 23 23 25 19       Flowsheet Row Counselor from 06/19/2021 in [facility] Counselor from 04/10/2021 in [facility] Counselor from 02/12/2021 in [facility]  C-SSRS RISK CATEGORY Moderate Risk Low Risk Low Risk        Assessment and Plan:  Debraann had been seeing some improvement with the increase in her Seroquel, however, she had a recent family tragedy of the sudden passing of another cousin on Sunday.  We will increase her hydroxyzine to better help with her spikes in anxiety.  We will not make any other medication changes at this time.  She will return to the office in 4 weeks.   MDD, Recurrent, Chronic  PTSD  GAD  Insomnia : -Continue Remeron 30 mg QHS for depression, anxiety, and sleep -Continue Seroquel 100 mg QHS for depression, anxiety, and sleep -Increase Hydroxyzine  to 50 mg TID PRN for anxiety   Collaboration of Care: Case Discussed with Supervising Physician Dr[facility]  Patient/Guardian was advised Release of Information must be obtained prior to any record release in order to  collaborate their care with an outside provider. Patient/Guardian was advised if they have not already done so to contact the registration department to sign all necessary forms in order for [place] to release information regarding their care.   Consent: Patient/Guardian gives verbal consent for treatment and assignment of benefits for services provided during this visit. Patient/Guardian expressed understanding and agreed to proceed.    Lauro Franklin, MD 07/04/2021, 3:00 PM

## 2021-07-09 DIAGNOSIS — Z7689 Persons encountering health services in other specified circumstances: Secondary | ICD-10-CM | POA: Diagnosis not present

## 2021-07-20 DIAGNOSIS — Z419 Encounter for procedure for purposes other than remedying health state, unspecified: Secondary | ICD-10-CM | POA: Diagnosis not present

## 2021-07-21 ENCOUNTER — Other Ambulatory Visit: Payer: Self-pay | Admitting: Critical Care Medicine

## 2021-07-22 NOTE — Telephone Encounter (Signed)
Requested Prescriptions  Pending Prescriptions Disp Refills  . famotidine (PEPCID) 20 MG tablet [Pharmacy Med Name: FAMOTIDINE 20MG  TABLETS] 180 tablet 0    Sig: TAKE 1 TABLET(20 MG) BY MOUTH TWICE DAILY     Gastroenterology:  H2 Antagonists Passed - 07/21/2021  6:48 AM      Passed - Valid encounter within last 12 months    Recent Outpatient Visits          3 months ago BMI 45.0-49.9, adult [facility])   [place] [facility], MD   11 months ago Major depression, recurrent, chronic [facility])   [place] [facility], MD   1 year ago Alcoholic cirrhosis of liver without ascites [facility])   [place] [facility], MD      Future Appointments            In 3 weeks Storm Frisk, Sharon Seller [facility]           . metoprolol tartrate (LOPRESSOR) 25 MG tablet [Pharmacy Med Name: METOPROLOL TARTRATE 25MG  TABLETS] 180 tablet 0    Sig: TAKE 1 TABLET(25 MG) BY MOUTH TWICE DAILY     Cardiovascular:  Beta Blockers Passed - 07/21/2021  6:48 AM      Passed - Last BP in normal range    BP Readings from Last 1 Encounters:  04/08/21 135/85         Passed - Last Heart Rate in normal range    Pulse Readings from Last 1 Encounters:  04/08/21 79         Passed - Valid encounter within last 6 months    Recent Outpatient Visits          3 months ago BMI 45.0-49.9, adult [facility])   [place] [facility] 06-15-1981, MD   11 months ago Major depression, recurrent, chronic [facility])   [place] [facility] Storm Frisk, MD   1 year ago Alcoholic cirrhosis of liver without ascites [facility])    [facility] Storm Frisk, MD      Future Appointments            In 3 weeks [facility], Storm Frisk [facility]

## 2021-07-24 DIAGNOSIS — Z7689 Persons encountering health services in other specified circumstances: Secondary | ICD-10-CM | POA: Diagnosis not present

## 2021-07-29 DIAGNOSIS — Z7689 Persons encountering health services in other specified circumstances: Secondary | ICD-10-CM | POA: Diagnosis not present

## 2021-08-06 ENCOUNTER — Ambulatory Visit (INDEPENDENT_AMBULATORY_CARE_PROVIDER_SITE_OTHER): Payer: Medicaid Other | Admitting: Student in an Organized Health Care Education/Training Program

## 2021-08-06 DIAGNOSIS — F339 Major depressive disorder, recurrent, unspecified: Secondary | ICD-10-CM

## 2021-08-06 DIAGNOSIS — F411 Generalized anxiety disorder: Secondary | ICD-10-CM

## 2021-08-06 DIAGNOSIS — F4310 Post-traumatic stress disorder, unspecified: Secondary | ICD-10-CM | POA: Diagnosis not present

## 2021-08-06 MED ORDER — HYDROXYZINE HCL 50 MG PO TABS
50.0000 mg | ORAL_TABLET | Freq: Three times a day (TID) | ORAL | 0 refills | Status: DC | PRN
Start: 2021-08-06 — End: 2021-09-10

## 2021-08-06 MED ORDER — MIRTAZAPINE 30 MG PO TABS: 30.0000 mg | ORAL_TABLET | Freq: Every day | ORAL | 0 refills | Status: DC

## 2021-08-06 MED ORDER — QUETIAPINE FUMARATE 100 MG PO TABS: 100.0000 mg | ORAL_TABLET | Freq: Every day | ORAL | 0 refills | Status: DC

## 2021-08-06 NOTE — Progress Notes (Signed)
Virtual Visit via Telephone Note  I connected with Lydia Vasquez on 08/06/21 at  2:00 PM EDT by telephone and verified that I am speaking with the correct person using two identifiers.  Location: Patient: patient's daughter's house Provider: [facility]   I discussed the limitations, risks, security and privacy concerns of performing an evaluation and management service by telephone and the availability of in person appointments. I also discussed with the patient that there may be a patient responsible charge related to this service. The patient expressed understanding and agreed to proceed.   History of Present Illness: Lydia Vasquez is a 61 yr old female who presents for follow up and medication management.  PPHx is significant for MDD, Recurrent, Anxiety, Insomnia.  No prior hospitalizations or suicide attempts.  She reports that her daughter gave birth 6 days ago.  She states that she has enjoyed helping take care of him.  She states that it has been amazing taking care of him and helped her significantly.  She states the increase in Seroquel has helped her sleep some.  She reports her appetite is still up and down but overall okay.  She reports no side effects from her medications.  Discussed with her that at this time we would keep her medications where they are and then see her back in the office in 1 month.  She is agreeable to this and had no concerns.  She reports she has a therapy appointment tomorrow and has been getting benefit from her therapy.  She reports no SI, HI, or AVH.  Discussed with her what to do in the event of a future crisis.  Discussed that she can return to [facility], go to [facility], go to the nearest ED, or call 911 or 988.   She reported understanding and had no concerns.    Observations/Objective: Patient had a brighter and more upbeat affect than last appointment and her mood is more upbeat than previous.  She spoke in a clear and coherent manner at a normal rate.  Her judgment  and insight are good.  Her memory is good.  Her concentration and attention span were good.  Her language was good.  She reports no SI or HI.  Her thought process was coherent and logical.  Assessment and Plan: Lydia Vasquez is a 61 yr old female who presents for follow up and medication management.  PPHx is significant for MDD, Recurrent, Anxiety, Insomnia.  No prior hospitalizations or suicide attempts.   Lydia Vasquez has improved some with the increase in her Seroquel and improved a moderate amount due to the birth of her grandson.  At this time we will not make any medication changes.  She will return for follow-up appointment in approximately 4 weeks.   MDD, Recurrent, Chronic  PTSD  GAD  Insomnia : -Continue Remeron 30 mg QHS for depression, anxiety, and sleep -Continue Seroquel 100 mg QHS for depression, anxiety, and sleep -Continue Hydroxyzine to 50 mg TID PRN for anxiety    Follow Up Instructions: Follow up in approximately 4 weeks.   I discussed the assessment and treatment plan with the patient. The patient was provided an opportunity to ask questions and all were answered. The patient agreed with the plan and demonstrated an understanding of the instructions.   The patient was advised to call back or seek an in-person evaluation if the symptoms worsen or if the condition fails to improve as anticipated.  I provided 12 minutes of non-face-to-face time during this encounter.  Lauro Franklin, MD

## 2021-08-07 ENCOUNTER — Ambulatory Visit (HOSPITAL_COMMUNITY): Payer: Self-pay | Admitting: Licensed Clinical Social Worker

## 2021-08-08 ENCOUNTER — Encounter (HOSPITAL_COMMUNITY): Payer: Medicaid Other | Admitting: Student in an Organized Health Care Education/Training Program

## 2021-08-15 ENCOUNTER — Ambulatory Visit: Payer: Medicaid Other | Admitting: Physician Assistant

## 2021-08-20 DIAGNOSIS — Z419 Encounter for procedure for purposes other than remedying health state, unspecified: Secondary | ICD-10-CM | POA: Diagnosis not present

## 2021-09-05 ENCOUNTER — Ambulatory Visit (INDEPENDENT_AMBULATORY_CARE_PROVIDER_SITE_OTHER): Payer: Medicaid Other | Admitting: Neurology

## 2021-09-05 DIAGNOSIS — G621 Alcoholic polyneuropathy: Secondary | ICD-10-CM | POA: Diagnosis not present

## 2021-09-05 DIAGNOSIS — Z7689 Persons encountering health services in other specified circumstances: Secondary | ICD-10-CM | POA: Diagnosis not present

## 2021-09-05 NOTE — Procedures (Signed)
[facility]  [address] [place], [address]  [place], [place] [address] Tel: [phone] Fax:  [phone] Test Date:  09/05/2021  Patient: Lydia Vasquez DOB: 01/05/1961 Physician: Roxana Hires Joven Mom,DO  Sex: Female Height: 5\' 8"  Ref Phys: , DO  ID#: Nita Sickle   Technician:    Patient Complaints: This is a 61 year old female referred for evaluation of bilateral feet paresthesias.  NCV & EMG Findings: Extensive electrodiagnostic testing of the right lower extremity and additional studies of the left shows:  Bilateral sural and superficial peroneal sensory responses are absent. Bilateral peroneal and tibial motor responses are within normal limits. Bilateral tibial H reflex studies are absent. There is no evidence of active or chronic motor axonal loss changes affecting any of the tested muscles.  Motor unit configuration and recruitment pattern is within normal limits.  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   ___________________________ 46 [name],DO    Nerve Conduction Studies Anti Sensory Summary Table   Stim Site NR Peak (ms) Norm Peak (ms) P-T Amp (V) Norm P-T Amp  Left Sup Peroneal Anti Sensory (Ant Lat Mall)  32C  12 cm NR  <4.6  >4  Right Sup Peroneal Anti Sensory (Ant Lat Mall)  32C  12 cm NR  <4.6  >4  Left Sural Anti Sensory (Lat Mall)  32C  Calf NR  <4.6  >4  Right Sural Anti Sensory (Lat Mall)  32C  Calf NR  <4.6  >4   Motor Summary Table   Stim Site NR Onset (ms) Norm Onset (ms) O-P Amp (mV) Norm O-P Amp Site1 Site2 Delta-0 (ms) Dist (cm) Vel (m/s) Norm Vel (m/s)  Left Peroneal Motor (Ext Dig Brev)  32C  Ankle    3.7 <6.0 4.4 >2.5 B Fib Ankle 7.4 38.0 51 >40  B Fib    11.1  3.9  Poplt B Fib 1.9 8.0 42 >40  Poplt    13.0  3.9         Right Peroneal Motor (Ext Dig Brev)  32C  Ankle    3.1 <6.0 5.0 >2.5 B Fib Ankle 8.1 40.0 49 >40  B  Fib    11.2  4.2  Poplt B Fib 2.0 10.0 50 >40  Poplt    13.2  3.9         Left Tibial Motor (Abd Hall Brev)  32C  Ankle    3.5 <6.0 8.3 >4 Knee Ankle 10.4 43.0 41 >40  Knee    13.9  4.4         Right Tibial Motor (Abd Hall Brev)  32C  Ankle    3.7 <6.0 8.8 >4 Knee Ankle 9.5 46.0 48 >40  Knee    13.2  6.0          H Reflex Studies   NR H-Lat (ms) Lat Norm (ms) L-R H-Lat (ms)  Left Tibial (Gastroc)  32C  NR  <35   Right Tibial (Gastroc)  32C  NR  <35    EMG   Side Muscle Ins Act Fibs Psw Fasc Number Recrt Dur Dur. Amp Amp. Poly Poly. Comment  Right AntTibialis Nml Nml Nml Nml Nml Nml Nml Nml Nml Nml Nml Nml N/A  Right Gastroc Nml Nml Nml Nml Nml Nml Nml Nml Nml Nml Nml Nml N/A  Right Flex Dig Long Nml Nml Nml Nml Nml Nml Nml Nml Nml Nml Nml Nml N/A  Right RectFemoris Nml Nml  Nml Nml Nml Nml Nml Nml Nml Nml Nml Nml N/A  Right BicepsFemS Nml Nml Nml Nml Nml Nml Nml Nml Nml Nml Nml Nml N/A  Left AntTibialis Nml Nml Nml Nml Nml Nml Nml Nml Nml Nml Nml Nml N/A  Left Gastroc Nml Nml Nml Nml Nml Nml Nml Nml Nml Nml Nml Nml N/A  Left RectFemoris Nml Nml Nml Nml Nml Nml Nml Nml Nml Nml Nml Nml N/A      Waveforms:

## 2021-09-10 ENCOUNTER — Encounter (HOSPITAL_COMMUNITY): Payer: Self-pay | Admitting: Student in an Organized Health Care Education/Training Program

## 2021-09-10 ENCOUNTER — Ambulatory Visit (INDEPENDENT_AMBULATORY_CARE_PROVIDER_SITE_OTHER): Payer: Medicaid Other | Admitting: Student in an Organized Health Care Education/Training Program

## 2021-09-10 ENCOUNTER — Ambulatory Visit (HOSPITAL_COMMUNITY): Payer: Self-pay | Admitting: Licensed Clinical Social Worker

## 2021-09-10 ENCOUNTER — Ambulatory Visit (INDEPENDENT_AMBULATORY_CARE_PROVIDER_SITE_OTHER): Payer: Medicaid Other | Admitting: Licensed Clinical Social Worker

## 2021-09-10 VITALS — BP 116/70 | HR 74 | Ht 70.5 in | Wt 299.2 lb

## 2021-09-10 DIAGNOSIS — F4310 Post-traumatic stress disorder, unspecified: Secondary | ICD-10-CM

## 2021-09-10 DIAGNOSIS — F339 Major depressive disorder, recurrent, unspecified: Secondary | ICD-10-CM

## 2021-09-10 DIAGNOSIS — F411 Generalized anxiety disorder: Secondary | ICD-10-CM

## 2021-09-10 DIAGNOSIS — Z7689 Persons encountering health services in other specified circumstances: Secondary | ICD-10-CM | POA: Diagnosis not present

## 2021-09-10 MED ORDER — MIRTAZAPINE 30 MG PO TABS: 30.0000 mg | ORAL_TABLET | Freq: Every day | ORAL | 1 refills | Status: DC

## 2021-09-10 MED ORDER — QUETIAPINE FUMARATE 100 MG PO TABS: 100.0000 mg | ORAL_TABLET | Freq: Every day | ORAL | 1 refills | Status: DC

## 2021-09-10 MED ORDER — HYDROXYZINE HCL 50 MG PO TABS
50.0000 mg | ORAL_TABLET | Freq: Three times a day (TID) | ORAL | 0 refills | Status: DC | PRN
Start: 2021-09-10 — End: 2021-10-08

## 2021-09-10 NOTE — Plan of Care (Signed)
  Problem: Depression CCP Problem  1 MDD Goal: LTG: Lydia Vasquez WILL SCORE LESS THAN 10 ON THE PATIENT HEALTH QUESTIONNAIRE (PHQ-9) Outcome: Progressing Goal: STG: Lydia Vasquez WILL PARTICIPATE IN AT LEAST 80% OF SCHEDULED INDIVIDUAL PSYCHOTHERAPY SESSIONS Outcome: Progressing Goal: STG: Lydia Vasquez WILL COMPLETE AT LEAST 80% OF ASSIGNED HOMEWORK Outcome: Progressing Goal: STG: Reduce overall depression score by a minimum of 25% on the Patient Health Questionnaire (PHQ-9) or the Montgomery-Asberg Depression Rating Scale (MADRS) Outcome: Progressing   Problem: Anxiety Disorder CCP Problem  1 GAD  Goal: LTG: Patient will score less than 5 on the Generalized Anxiety Disorder 7 Scale (GAD-7) Outcome: Progressing Goal: STG: Patient will participate in at least 80% of scheduled individual psychotherapy sessions Outcome: Progressing   Problem: Anxiety Disorder CCP Problem  1 GAD  Goal: STG: Patient will attend couples/family counseling sessions with partner/family member 1x per week for the next 4 weeks Outcome: Not Met (add Reason) Note: Pt has been struggling with independent session at this time as evidence by 2 no shows and 1 left w/o being seen.    Problem: Depression CCP Problem  1 MDD Intervention: WORK WITH Lydia Vasquez TO TRACK SYMPTOMS, TRIGGERS AND/OR SKILL USE THROUGH A MOOD CHART, DIARY CARD, OR JOURNAL Intervention: Administer the PHQ-9 or MADRS weekly for 4 weeks   Problem: Anxiety Disorder CCP Problem  1 GAD  Intervention: Discuss risks and benefits of medication treatment options for this problem and prescribe as indicated Intervention: Encourage patient to take psychotropic medication as prescribed Intervention: Review results of GAD-7 with the patient to track progress Intervention: Work with patient to track symptoms, triggers and/or skill use through a mood chart, diary card, or journal
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
## 2021-09-10 NOTE — Progress Notes (Signed)
BH MD/PA/NP OP Progress Note  09/10/2021 11:32 AM Lydia Vasquez  MRN:  [id number]  Chief Complaint:  Chief Complaint  Patient presents with   Follow-up   Depression   Anxiety   HPI:  Lydia Vasquez is a 61 yr old female who presents for follow up and medication management.  PPHx is significant for MDD, Recurrent, Anxiety, and Insomnia, and no prior hospitalizations or suicide attempts.  She reports that she has been doing okay since our last appointment.  She reports that her grandson is one of the best thing that ever happened to her.  She reports that whenever she is feeling sad or down she will look at pictures of him or go and play with him.  She reports that overall she has been doing better.  She reports that at night her mind does not race like it used to.  She reports she still does have some ups and downs but overall he is less depressed.  She reports her anxiety is still an issue specifically surrounding her health.  She reports that her doctor told her the numbness in her legs is unlikely to improve.  She reports she is concerned about infections and needing amputations.  Discussed with her the importance of checking her shoes before she puts them on for any debris and at least daily checking her feet to ensure there are no cuts or injuries and she reports that this is what her doctor told her.  She reports that her anxiety is worse and when she is around a lot of people so that it does interfere with her doing things.  She reports that she is only been taking her hydroxyzine at night to help her sleep.  Discussed that it can help with anxiety and to try using it during the day.  Discussed that at first she should try half a tablet to make sure it is not too sedating and to have someone go with her.  Discussed that if this is successful in reducing her anxiety she may be able to do more things that she wants to which should help with her mood overall improve.  Discussed that if this does  not help we could consider an additional antidepressant at her next appointment.  She reported understanding and was agreeable with this plan.  She reports no SI, HI, or AVH.  She reports that her sleep is doing good.  She reports her appetite is doing good.  She will return for follow-up in about 4 weeks.    Visit Diagnosis:    ICD-10-CM   1. Major depression, recurrent, chronic (HCC)  F33.9 QUEtiapine (SEROQUEL) 100 MG tablet    mirtazapine (REMERON) 30 MG tablet    2. GAD (generalized anxiety disorder)  F41.1 hydrOXYzine (ATARAX) 50 MG tablet    3. PTSD (post-traumatic stress disorder)  F43.10 QUEtiapine (SEROQUEL) 100 MG tablet    mirtazapine (REMERON) 30 MG tablet      Past Psychiatric History: MDD, Recurrent, Anxiety, and Insomnia, and no prior hospitalizations or suicide attempts.  Past Medical History:  Past Medical History:  Diagnosis Date   Anxiety    Depression    Hyperlipidemia    Hypertension    Ventricular tachycardia (HCC) 06/11/2020    Past Surgical History:  Procedure Laterality Date   ABDOMINAL HYSTERECTOMY     CESAREAN SECTION      Family Psychiatric History: Maternal Cousin- Schizophrenia or Bipolar Disorder No known Substance Abuse or Suicides.  Family History:  Family  History  Problem Relation Age of Onset   Stroke Mother    Dementia Mother     Social History:  Social History   Socioeconomic History   Marital status: Single    Spouse name: Not on file   Number of children: Not on file   Years of education: Not on file   Highest education level: Not on file  Occupational History   Not on file  Tobacco Use   Smoking status: Former    Types: Cigarettes   Smokeless tobacco: Never  Vaping Use   Vaping Use: Never used  Substance and Sexual Activity   Alcohol use: Not Currently   Drug use: Never   Sexual activity: Not on file  Other Topics Concern   Not on file  Social History Narrative   Not on file   Social Determinants of Health    Financial Resource Strain: Low Risk  (02/12/2021)   Overall Financial Resource Strain (CARDIA)    Difficulty of Paying Living Expenses: Not hard at all  Food Insecurity: Food Insecurity Present (02/12/2021)   Hunger Vital Sign    Worried About Running Out of Food in the Last Year: Sometimes true    Ran Out of Food in the Last Year: Sometimes true  Transportation Needs: Unmet Transportation Needs (02/12/2021)   PRAPARE - Transportation    Lack of Transportation (Medical): Yes    Lack of Transportation (Non-Medical): Yes  Physical Activity: Inactive (02/12/2021)   Exercise Vital Sign    Days of Exercise per Week: 0 days    Minutes of Exercise per Session: 0 min  Stress: Stress Concern Present (02/12/2021)   [employer] of Occupational Health - Occupational Stress Questionnaire    Feeling of Stress : Very much  Social Connections: Socially Isolated (02/12/2021)   Social Connection and Isolation Panel [NHANES]    Frequency of Communication with Friends and Family: Once a week    Frequency of Social Gatherings with Friends and Family: More than three times a week    Attends Religious Services: Never    Database administrator or Organizations: No    Attends Engineer, structural: Never    Marital Status: Never married    Allergies: No Known Allergies  Metabolic Disorder Labs: No results found for: "HGBA1C", "MPG" No results found for: "PROLACTIN" Lab Results  Component Value Date   CHOL 192 10/26/2019   TRIG 332 (H) 10/26/2019   HDL <10 (L) 10/26/2019   CHOLHDL NOT CALCULATED 10/26/2019   VLDL 66 (H) 10/26/2019   LDLCALC NOT CALCULATED 10/26/2019   No results found for: "TSH"  Therapeutic Level Labs: No results found for: "LITHIUM" No results found for: "VALPROATE" No results found for: "CBMZ"  Current Medications: Current Outpatient Medications  Medication Sig Dispense Refill   B Complex-C-Folic Acid (B COMPLEX-VITAMIN C-FOLIC ACID) 1 MG tablet Take 1 tablet  by mouth daily with breakfast. 30 tablet 2   famotidine (PEPCID) 20 MG tablet TAKE 1 TABLET(20 MG) BY MOUTH TWICE DAILY 180 tablet 0   hydrOXYzine (ATARAX) 50 MG tablet Take 1 tablet (50 mg total) by mouth 3 (three) times daily as needed. 60 tablet 0   metoprolol tartrate (LOPRESSOR) 25 MG tablet TAKE 1 TABLET(25 MG) BY MOUTH TWICE DAILY 180 tablet 0   mirtazapine (REMERON) 30 MG tablet Take 1 tablet (30 mg total) by mouth at bedtime. 30 tablet 1   QUEtiapine (SEROQUEL) 100 MG tablet Take 1 tablet (100 mg total) by mouth at bedtime.  30 tablet 1   No current facility-administered medications for this visit.     Musculoskeletal: Strength & Muscle Tone: within normal limits Gait & Station: normal Patient leans: N/A  Psychiatric Specialty Exam: Review of Systems  Respiratory:  Negative for shortness of breath.   Cardiovascular:  Negative for chest pain.  Gastrointestinal:  Negative for abdominal pain, constipation, diarrhea, nausea and vomiting.  Neurological:  Negative for dizziness, weakness and headaches.  Psychiatric/Behavioral:  Positive for dysphoric mood. Negative for hallucinations, self-injury, sleep disturbance and suicidal ideas. The patient is nervous/anxious.     Blood pressure 116/70, pulse 74, height 5' 10.5" (1.791 m), weight 299 lb 3.2 oz (135.7 kg), SpO2 96 %.Body mass index is 42.32 kg/m.  General Appearance: Casual and Fairly Groomed  Eye Contact:  Good  Speech:  Clear and Coherent and Normal Rate  Volume:  Normal  Mood:  Anxious  Affect:  Congruent  Thought Process:  Coherent and Goal Directed  Orientation:  Full (Time, Place, and Person)  Thought Content: Logical   Suicidal Thoughts:  No  Homicidal Thoughts:  No  Memory:  Immediate;   Good Recent;   Good  Judgement:  Good  Insight:  Good  Psychomotor Activity:  Normal  Concentration:  Concentration: Good and Attention Span: Good  Recall:  Good  Fund of Knowledge: Good  Language: Good  Akathisia:   Negative  Handed:  Right  AIMS (if indicated): done AIMS=0  Assets:  Communication Skills Desire for Improvement Housing Leisure Time Resilience Social Support  ADL's:  Intact  Cognition: WNL  Sleep:  Good   Screenings: GAD-7    Health and safety inspector from 09/10/2021 in [facility] Counselor from 06/19/2021 in [facility] Counselor from 04/10/2021 in [facility] Office Visit from 04/08/2021 in [place] from 10/10/2020 in [place]  Total GAD-7 Score 14 19 21 20 21       PHQ2-9    Flowsheet Row Counselor from 09/10/2021 in [facility] Counselor from 06/19/2021 in [facility] Counselor from 04/10/2021 in [facility] Office Visit from 04/08/2021 in [place] Counselor from 02/12/2021 in [place]  PHQ-2 Total Score 6 3 6 6 4   PHQ-9 Total Score 19 20 23 23 25       Flowsheet Row Counselor from 09/10/2021 in [facility] Counselor from 06/19/2021 in [facility] Counselor from 04/10/2021 in [place] Moderate Risk Low Risk        Assessment and Plan:  Ellawyn Schisler is a 61 yr old female who presents for follow up and medication management.  PPHx is significant for MDD, Recurrent, Anxiety, and Insomnia, and no prior hospitalizations or suicide attempts.   Dajia has been doing better overall but continues to have issues with anxiety.  She has had significant benefit from her grandson and does noticeably brighten when talking about him.  She has only been taking her hydroxyzine at night to help with her racing thoughts which has been helpful but she  continues to have significant anxiety with crowds and going out during the day.  She will begin to take half a tablet of hydroxyzine prior to going out to see if this can better control her anxiety and allow her  to do more of the things that she wants to do.  If this is not successful in better controlling her anxiety we will consider starting an SSRI at her next appointment.  Her sleep has significantly improved with this combination of the Remeron and Seroquel.  She will return in approximately 4 weeks for follow-up.    MDD, Recurrent, Chronic  PTSD  GAD  Insomnia : -Continue Remeron 30 mg QHS for depression, anxiety, and sleep. 30 tablets with 1 refill. -Continue Seroquel 100 mg QHS for depression, anxiety, and sleep. 30 tablets with 1 refill. -Continue Hydroxyzine 25-50 mg TID PRN for anxiety. 60 (50 mg) tablets with 0 refills.    Collaboration of Care:   Patient/Guardian was advised Release of Information must be obtained prior to any record release in order to collaborate their care with an outside provider. Patient/Guardian was advised if they have not already done so to contact the registration department to sign all necessary forms in order for [place] to release information regarding their care.   Consent: Patient/Guardian gives verbal consent for treatment and assignment of benefits for services provided during this visit. Patient/Guardian expressed understanding and agreed to proceed.    Briant Cedar, MD 09/10/2021, 11:32 AM

## 2021-09-20 DIAGNOSIS — Z419 Encounter for procedure for purposes other than remedying health state, unspecified: Secondary | ICD-10-CM | POA: Diagnosis not present

## 2021-10-02 ENCOUNTER — Ambulatory Visit: Payer: Self-pay | Admitting: *Deleted

## 2021-10-02 ENCOUNTER — Ambulatory Visit: Payer: Medicaid Other | Admitting: Critical Care Medicine

## 2021-10-02 ENCOUNTER — Ambulatory Visit: Payer: Medicaid Other | Admitting: Physician Assistant

## 2021-10-02 NOTE — Telephone Encounter (Signed)
Reason for Disposition  Unable to walk  Answer Assessment - Initial Assessment Questions 1. ONSET: "When did the pain begin?"      She has an appt today at 10:10 today but can't make it.   I was carrying a 40 pack of water.   After putting the water down I got a bad pain on the right side of my back.   When I sit down and get up it really hurts worse.   I can't put pressure on my right leg.    I cancelled my transportation because I can't walk.   I don't have a smart phone.     2. LOCATION: "Where does it hurt?" (upper, mid or lower back)     My right side and across my back and the top of my leg.   After trying to walk after sitting it really hurts.    3. SEVERITY: "How bad is the pain?"  (e.g., Scale 1-10; mild, moderate, or severe)   - MILD (1-3): Doesn't interfere with normal activities.    - MODERATE (4-7): Interferes with normal activities or awakens from sleep.    - SEVERE (8-10): Excruciating pain, unable to do any normal activities.      Severe 4. PATTERN: "Is the pain constant?" (e.g., yes, no; constant, intermittent)      When I walk or try to stand up straight.   It still hurts when I lay down. 5. RADIATION: "Does the pain shoot into your legs or somewhere else?"     Yes my right leg. It happened Monday.   It's the same.  I'm for a PAP smear today but I can't make it. Can I take some Tylenol.    If the Tylenol doesn't work [employer].   I thought it would get better.    6. CAUSE:  "What do you think is causing the back pain?"      I was carrying a 40 pack of water.    7. BACK OVERUSE:  "Any recent lifting of heavy objects, strenuous work or exercise?"     Yes lifting  8. MEDICINES: "What have you taken so far for the pain?" (e.g., nothing, acetaminophen, NSAIDS)     Nothing.   9. NEUROLOGIC SYMPTOMS: "Do you have any weakness, numbness, or problems with bowel/bladder control?"     I can move it to walk but when I put the pressure on it hurts real bad.   10. OTHER SYMPTOMS:  "Do you have any other symptoms?" (e.g., fever, abdomen pain, burning with urination, blood in urine)       The pain is at the back and hip and hurts down my leg. 11. PREGNANCY: "Is there any chance you are pregnant?" "When was your last menstrual period?"       Not asked  Protocols used: Back Pain-A-AH

## 2021-10-02 NOTE — Telephone Encounter (Signed)
Pt was called and informed that she can obtain extra strength tylenol over the counter.

## 2021-10-02 NOTE — Telephone Encounter (Signed)
  Chief Complaint: Right sided back pain into hip and into right leg. Symptoms: Severe back pain after carrying a heavy 40 pack of water.   Requested her appt for today at 10:10 be rescheduled.    She can't walk.  When tries to put pressure on leg to walk she can't due to pain.   Frequency: Since Monday Pertinent Negatives: Patient denies Pain going all the way down her right leg. Disposition: [x] ED /[] Urgent Care (no appt availability in office) / [] Appointment(In office/virtual)/ []  [place] [facility]/ [] Home Care/ [] Refused Recommended Disposition /[] [place] [facility]/ []  Follow-up with PCP Additional Notes: She was agreeable to going to the ED after trying Tylenol first    She has not taken anything for the pain since it happened on Monday.   She is  asking if Dr will  call in a Tylenol extra strength rx for her so they can delivery it to her.   [facility] on [address]  Appt changed to 10/24/2021 at 3:50 with PA-C.

## 2021-10-08 ENCOUNTER — Encounter (HOSPITAL_COMMUNITY): Payer: Self-pay | Admitting: Student in an Organized Health Care Education/Training Program

## 2021-10-08 ENCOUNTER — Ambulatory Visit (INDEPENDENT_AMBULATORY_CARE_PROVIDER_SITE_OTHER): Payer: Medicaid Other | Admitting: Student in an Organized Health Care Education/Training Program

## 2021-10-08 DIAGNOSIS — F411 Generalized anxiety disorder: Secondary | ICD-10-CM | POA: Diagnosis not present

## 2021-10-08 DIAGNOSIS — F339 Major depressive disorder, recurrent, unspecified: Secondary | ICD-10-CM | POA: Diagnosis not present

## 2021-10-08 DIAGNOSIS — F4310 Post-traumatic stress disorder, unspecified: Secondary | ICD-10-CM

## 2021-10-08 MED ORDER — MIRTAZAPINE 30 MG PO TABS: 30.0000 mg | ORAL_TABLET | Freq: Every day | ORAL | 1 refills | Status: DC

## 2021-10-08 MED ORDER — QUETIAPINE FUMARATE 100 MG PO TABS: 100.0000 mg | ORAL_TABLET | Freq: Every day | ORAL | 1 refills | Status: DC

## 2021-10-08 MED ORDER — BUSPIRONE HCL 7.5 MG PO TABS: 7.5000 mg | ORAL_TABLET | Freq: Two times a day (BID) | ORAL | 1 refills | Status: DC

## 2021-10-08 NOTE — Progress Notes (Signed)
Virtual Visit via Telephone Note  I connected with Lydia Vasquez on 10/08/21 at 10:00 AM EDT by telephone and verified that I am speaking with the correct person using two identifiers.  Location: Patient: Home Provider: [facility]   I discussed the limitations, risks, security and privacy concerns of performing an evaluation and management service by telephone and the availability of in person appointments. I also discussed with the patient that there may be a patient responsible charge related to this service. The patient expressed understanding and agreed to proceed.   History of Present Illness:  Lydia Vasquez is a 61 yr old female who presents via telephone for follow up and medication management.  PPHx is significant for MDD, Recurrent, Anxiety, and Insomnia, and no prior hospitalizations or suicide attempts.  She reports her anxiety continues to be a significant issue and that it has recently worsened from her concern over some medical issues she has been dealing with.  She reports that while lifting something she pulled a muscle in her back and has been hurting since.  She also reports that her eye is red and painful today.  She reports she plans to go to urgent care after this appointment to be seen by them.  She reports that her sleep continues to be better with a combination of Remeron and Seroquel.  She reports she does not notice any improvement with the hydroxyzine.  Discussed a trial of BuSpar as this can help with her anxiety.  She was agreeable to a trial.  She does have a therapy appointment tomorrow.  She reports no SI, HI, or AVH.  She reports no other concerns at present.  She will return for follow-up in approximately 4 weeks.   Observations/Objective: Throughout the interview she spoke in a clear and coherent manner at a normal rate and volume.  She was anxious and her affect was congruent with this.  Her memory was good.  Her thought content was logical.  Her judgment and insight  were good.  She reports no SI, HI, or AVH.  She reports her sleep is good.  Assessment and Plan:  Lydia Vasquez is a 61 yr old female who presents via telephone for follow up and medication management.  PPHx is significant for MDD, Recurrent, Anxiety, and Insomnia, and no prior hospitalizations or suicide attempts.     Vannia has been more anxious since her last appointment due to medical issues.  She reports her sleep has improved and continues to be better with the combination of Remeron and Seroquel.  Since the hydroxyzine is not helping with her anxiety we will stop this and will trial BuSpar.  If this is not successful in better controlling her anxiety we will consider starting an SSRI at her next appointment.  She will return for follow-up in approximately 4 weeks.       MDD, Recurrent, Chronic  PTSD  GAD  Insomnia : -Continue Remeron 30 mg QHS for depression, anxiety, and sleep. 30 tablets with 1 refill. -Continue Seroquel 100 mg QHS for depression, anxiety, and sleep. 30 tablets with 1 refill. -Stop Hydroxyzine -Start Buspar 7.5 mg BID for anxiety.  60 tablets with 1 refill sent.   Follow Up Instructions:    I discussed the assessment and treatment plan with the patient. The patient was provided an opportunity to ask questions and all were answered. The patient agreed with the plan and demonstrated an understanding of the instructions.   The patient was advised to call back or  seek an in-person evaluation if the symptoms worsen or if the condition fails to improve as anticipated.  I provided 13 minutes of non-face-to-face time during this encounter.   Lauro Franklin, MD

## 2021-10-09 ENCOUNTER — Ambulatory Visit (INDEPENDENT_AMBULATORY_CARE_PROVIDER_SITE_OTHER): Payer: Medicaid Other | Admitting: Licensed Clinical Social Worker

## 2021-10-09 DIAGNOSIS — Z7689 Persons encountering health services in other specified circumstances: Secondary | ICD-10-CM | POA: Diagnosis not present

## 2021-10-09 DIAGNOSIS — F339 Major depressive disorder, recurrent, unspecified: Secondary | ICD-10-CM

## 2021-10-09 DIAGNOSIS — F4310 Post-traumatic stress disorder, unspecified: Secondary | ICD-10-CM

## 2021-10-09 NOTE — Plan of Care (Signed)
  Problem: Depression CCP Problem  1 MDD Goal: LTG: Lydia Vasquez WILL SCORE LESS THAN 10 ON THE PATIENT HEALTH QUESTIONNAIRE (PHQ-9) Outcome: Progressing Goal: STG: Lydia Vasquez WILL PARTICIPATE IN AT LEAST 80% OF SCHEDULED INDIVIDUAL PSYCHOTHERAPY SESSIONS Outcome: Progressing Goal: STG: Lydia Vasquez WILL COMPLETE AT LEAST 80% OF ASSIGNED HOMEWORK Outcome: Progressing Goal: STG: Reduce overall depression score by a minimum of 25% on the Patient Health Questionnaire (PHQ-9) or the Montgomery-Asberg Depression Rating Scale (MADRS) Outcome: Progressing   Problem: Anxiety Disorder CCP Problem  1 GAD  Goal: STG: Patient will participate in at least 80% of scheduled individual psychotherapy sessions Outcome: Progressing Goal: STG: Patient will attend couples/family counseling sessions with partner/family member 1x per week for the next 4 weeks Outcome: Progressing   Problem: Anxiety Disorder CCP Problem  1 GAD  Goal: LTG: Patient will score less than 5 on the Generalized Anxiety Disorder 7 Scale (GAD-7) Outcome: Not Progressing   Problem: Depression CCP Problem  1 MDD Intervention: ENCOURAGE Lydia Vasquez TO PARTICIPATE IN RECOVERY PEER SUPPORT ACTIVITIES WEEKLY Intervention: WORK WITH Lydia Vasquez TO IDENTIFY THEIR 3 PERSONAL GOALS FOR MANAGING DEPRESSION SYMPTOMS AND ADD TO THIS PLAN Intervention: EDUCATE Lydia Vasquez ON COGNITIVE DISTORTIONS AND THE RATIONALE FOR TREATMENT OF DEPRESSION

## 2021-10-09 NOTE — Progress Notes (Signed)
   THERAPIST PROGRESS NOTE  Session Time: 30   Participation Level: Active  Behavioral Response: CasualAlertAnxious and Depressed  Type of Therapy: Individual Therapy  Treatment Goals addressed: Decrease PHQ-9 below 10.  ProgressTowards Goals: Progressing  Interventions: CBT and Supportive  Summary: Lydia Vasquez is a 61 y.o. female who presents with depressed and anxious mood\affect.  Patient was pleasant, cooperative, maintained good eye contact.  She engaged well in therapy session was dressed casually.  Ms. Cavallaro was alert and oriented x5.  Reports primary stressor as illness.  Patient reports that she has been having back pain and needs to go see the doctor again after they found some abnormalities from her radiology results.  Patient reports tension, worry, hopelessness, restlessness, and tearfulness due to possible illness.  Patient also reports that she followed up with her neurologist who told her that she would not gain her feeling back in her lower leg or feet.  Patient reports that she has been utilizing coping skills such as spending time with her new grandchild.  Suicidal/Homicidal: Nowithout intent/plan  Therapist Response:     Intervention/Plan: LCSW used supportive therapy for praise and encouragement.  LCSW used CBT for reframing and cognitive restructuring.  LCSW educated patient on signs and symptoms of depression and PTSD.  LCSW reviewed the worksheet of"Gratitude exercises" this work she was from therapy [email or web address].  LCSW printed out worksheet and provided it to patient.  Patient and LCSW discussed using at least 1 exercise per week.  LCSW administered the PHQ-9.  LCSW administered the GAD-7.  Plan: Return again in 3 weeks.  Diagnosis: No diagnosis found.  Collaboration of Care: Other None test   Patient/Guardian was advised Release of Information must be obtained prior to any record release in order to collaborate their care with an outside provider.  Patient/Guardian was advised if they have not already done so to contact the registration department to sign all necessary forms in order for [place] to release information regarding their care.   Consent: Patient/Guardian gives verbal consent for treatment and assignment of benefits for services provided during this visit. Patient/Guardian expressed understanding and agreed to proceed.   Dory Horn, LCSW 10/09/2021

## 2021-10-10 ENCOUNTER — Ambulatory Visit (HOSPITAL_COMMUNITY): Payer: Medicaid Other | Admitting: Licensed Clinical Social Worker

## 2021-10-19 ENCOUNTER — Other Ambulatory Visit: Payer: Self-pay | Admitting: Critical Care Medicine

## 2021-10-20 DIAGNOSIS — Z419 Encounter for procedure for purposes other than remedying health state, unspecified: Secondary | ICD-10-CM | POA: Diagnosis not present

## 2021-10-21 NOTE — Telephone Encounter (Signed)
Requested Prescriptions  Pending Prescriptions Disp Refills  . metoprolol tartrate (LOPRESSOR) 25 MG tablet [Pharmacy Med Name: METOPROLOL TARTRATE 25MG  TABLETS] 180 tablet 0    Sig: TAKE 1 TABLET(25 MG) BY MOUTH TWICE DAILY     Cardiovascular:  Beta Blockers Failed - 10/19/2021  6:33 AM      Failed - Valid encounter within last 6 months    Recent Outpatient Visits          6 months ago BMI 45.0-49.9, adult [facility])   [place] Elsie Stain, MD   1 year ago Major depression, recurrent, chronic [facility])   [place] Elsie Stain, MD   1 year ago Alcoholic cirrhosis of liver without ascites [facility])   [place], [place]      Future Appointments            In 3 days [place] BP in normal range    BP Readings from Last 1 Encounters:  04/08/21 135/85         Passed - Last Heart Rate in normal range    Pulse Readings from Last 1 Encounters:  04/08/21 79         . famotidine (PEPCID) 20 MG tablet [Pharmacy Med Name: FAMOTIDINE 20MG  TABLETS] 180 tablet 1    Sig: TAKE 1 TABLET(20 MG) BY MOUTH TWICE DAILY     Gastroenterology:  H2 Antagonists Passed - 10/19/2021  6:33 AM      Passed - Valid encounter within last 12 months    Recent Outpatient Visits          6 months ago BMI 45.0-49.9, adult [facility])   [place] Elsie Stain, MD   1 year ago Major depression, recurrent, chronic [facility])   [place] Elsie Stain, MD   1 year ago Alcoholic cirrhosis of liver without ascites [facility])   [place], [place]      Future Appointments            In 3 days Thereasa Solo, [place]

## 2021-10-24 ENCOUNTER — Ambulatory Visit: Payer: Medicaid Other | Admitting: Physician Assistant

## 2021-10-28 ENCOUNTER — Ambulatory Visit (HOSPITAL_COMMUNITY): Payer: Self-pay | Admitting: Licensed Clinical Social Worker

## 2021-11-20 ENCOUNTER — Ambulatory Visit (HOSPITAL_COMMUNITY): Payer: Medicaid Other | Admitting: Licensed Clinical Social Worker

## 2021-11-20 DIAGNOSIS — Z419 Encounter for procedure for purposes other than remedying health state, unspecified: Secondary | ICD-10-CM | POA: Diagnosis not present

## 2021-11-27 ENCOUNTER — Ambulatory Visit: Payer: Medicaid Other | Admitting: Physician Assistant

## 2021-12-06 ENCOUNTER — Telehealth (HOSPITAL_COMMUNITY): Payer: Self-pay | Admitting: *Deleted

## 2021-12-06 NOTE — Telephone Encounter (Signed)
Received message that patient needed to call.  She reports she was calling because she did not know what to do about her appointment she has on Monday 11/20.  She reports that her daughter has gone back to work and right now she is watching her grand child and so would not be able to come in and wanted to know if the appointment could be changed to a telephone visit.  Discussed with her that this would not be a problem and we could change our appointment to a phone call.  She reports she is still having some difficulty with the recent passing of her aunt due to multiple deaths in the family recently but she reports she is managing.  Discussed that we will consider medication changes on Monday and she was agreeable to this.  She reports no SI, HI, or AVH.  She reports no other concerns at present.     Arna Snipe MD Resident

## 2021-12-06 NOTE — Telephone Encounter (Signed)
PATIENT CALLED & REQUESTED A CALL SHE SOUNDS VERY FRAZZLED & MENTIONED SHE RECENTLY HAD DEATH IN HER FAMILY & JUST RETURNED FROM [place]

## 2021-12-09 ENCOUNTER — Ambulatory Visit (INDEPENDENT_AMBULATORY_CARE_PROVIDER_SITE_OTHER): Payer: Medicaid Other | Admitting: Student in an Organized Health Care Education/Training Program

## 2021-12-09 DIAGNOSIS — F4310 Post-traumatic stress disorder, unspecified: Secondary | ICD-10-CM | POA: Diagnosis not present

## 2021-12-09 DIAGNOSIS — F411 Generalized anxiety disorder: Secondary | ICD-10-CM

## 2021-12-09 DIAGNOSIS — F339 Major depressive disorder, recurrent, unspecified: Secondary | ICD-10-CM | POA: Diagnosis not present

## 2021-12-09 MED ORDER — BUSPIRONE HCL 7.5 MG PO TABS
7.5000 mg | ORAL_TABLET | Freq: Two times a day (BID) | ORAL | 1 refills | Status: DC
Start: 2021-12-09 — End: 2022-01-10

## 2021-12-09 MED ORDER — MIRTAZAPINE 45 MG PO TABS: 45.0000 mg | ORAL_TABLET | Freq: Every day | ORAL | 1 refills | Status: DC

## 2021-12-09 MED ORDER — QUETIAPINE FUMARATE 100 MG PO TABS: 100.0000 mg | ORAL_TABLET | Freq: Every day | ORAL | 1 refills | Status: DC

## 2021-12-09 NOTE — Progress Notes (Signed)
Virtual Visit via Telephone Note  I connected with Lydia Vasquez on 12/09/21 at  1:00 PM EST by telephone and verified that I am speaking with the correct person using two identifiers.  Location: Patient: Home Provider: [facility]   I discussed the limitations, risks, security and privacy concerns of performing an evaluation and management service by telephone and the availability of in person appointments. I also discussed with the patient that there may be a patient responsible charge related to this service. The patient expressed understanding and agreed to proceed.   History of Present Illness:  Lydia Vasquez is a 61 yr old female who presents via telephone for follow up and medication management.  PPHx is significant for MDD, Recurrent, Anxiety, PTSD and Insomnia, and no prior hospitalizations or suicide attempts.   She reports continued anxiety due to the passing of multiple family members having recently returned from her aunt's funeral.  She reports her brother is also doing poorly.  She reports that he now needs a heart and kidney transplant and whenever she talks to him he sounds worse every time.  When asked how she felt the BuSpar had been working for her she reports she had not felt much of a difference but that she was only taking it at night.  Discussed that this medication works best during the day and recommended trying it in the morning and early afternoon.  She states she will do this.  Also discussed further increasing her Remeron which she was agreeable to.  She reports her sleep is good.  She reports her appetite is good.  She reports having occasional SI but that she always pushes back against these thoughts specifically due to her grandson and responsibility to family in general.  She reports no HI or AVH.  She reports no other concerns at present.  She will return for follow-up in approximately 4 weeks.  Observations/Objective:  Psychiatric Specialty Exam:   Review of Systems   Respiratory:  Negative for shortness of breath.   Cardiovascular:  Negative for chest pain.  Gastrointestinal:  Negative for abdominal pain, constipation, diarrhea, nausea and vomiting.  Neurological:  Negative for dizziness, weakness and headaches.  Psychiatric/Behavioral:  Positive for dysphoric mood. Negative for hallucinations, sleep disturbance and suicidal ideas. The patient is nervous/anxious.     There were no vitals taken for this visit.There is no height or weight on file to calculate BMI.  General Appearance: Could Not Assess due to Phone Visit  Eye Contact:  Could Not Assess due to Phone Visit  Speech:  Clear and Coherent and Normal Rate  Volume:  Normal  Mood:  Anxious  Affect:  Congruent  Thought Process:  Coherent and Goal Directed  Orientation:  Full (Time, Place, and Person)  Thought Content:  WDL and Logical  Suicidal Thoughts:   Occasional SI, none today,  Homicidal Thoughts:  No  Memory:  Immediate;   Good Recent;   Good  Judgement:  Good  Insight:  Good  Psychomotor Activity:  Normal  Concentration:  Concentration: Good and Attention Span: Good  Recall:  Good  Fund of Knowledge:  Good  Language:  Good  Akathisia:  Negative  Handed:  Right  AIMS (if indicated):     Assets:  Communication Skills Desire for Improvement Housing Resilience Social Support  ADL's:  Intact  Cognition:  WNL  Sleep:   good     Assessment and Plan:  Lydia Vasquez is a 61 yr old female who presents via telephone  for follow up and medication management.  PPHx is significant for MDD, Recurrent, Anxiety, PTSD and Insomnia, and no prior hospitalizations or suicide attempts.    Olanna continues to have significant anxiety especially due to multiple family members passing and the increasing likelihood that her brother might pass.  We will increase her Remeron at this time.  She will also begin to take the BuSpar twice a day.  If her anxiety continues to be significant may consider  switching off of Remeron onto another antidepressant.  She will return for follow-up in approximately 4 weeks.   MDD, Recurrent, Chronic  PTSD  GAD  Insomnia : -Increase Remeron to 45 mg QHS for depression, anxiety, and sleep. 30 tablets with 1 refill. -Continue Seroquel 100 mg QHS for depression, anxiety, and sleep. 30 tablets with 1 refill. -Continue Buspar 7.5 mg BID for anxiety.  60 tablets with 1 refill sent.      Follow Up Instructions:    I discussed the assessment and treatment plan with the patient. The patient was provided an opportunity to ask questions and all were answered. The patient agreed with the plan and demonstrated an understanding of the instructions.   The patient was advised to call back or seek an in-person evaluation if the symptoms worsen or if the condition fails to improve as anticipated.  I provided 13 minutes of non-face-to-face time during this encounter.   Lauro Franklin, MD

## 2021-12-20 DIAGNOSIS — Z419 Encounter for procedure for purposes other than remedying health state, unspecified: Secondary | ICD-10-CM | POA: Diagnosis not present

## 2021-12-25 ENCOUNTER — Ambulatory Visit: Payer: Medicaid Other | Admitting: Physician Assistant

## 2022-01-01 ENCOUNTER — Ambulatory Visit: Payer: Medicaid Other | Admitting: Physician Assistant

## 2022-01-03 ENCOUNTER — Other Ambulatory Visit: Payer: Self-pay | Admitting: Critical Care Medicine

## 2022-01-06 ENCOUNTER — Telehealth (HOSPITAL_COMMUNITY): Payer: Medicaid Other | Admitting: Student in an Organized Health Care Education/Training Program

## 2022-01-10 ENCOUNTER — Telehealth (INDEPENDENT_AMBULATORY_CARE_PROVIDER_SITE_OTHER): Payer: Medicaid Other | Admitting: Student in an Organized Health Care Education/Training Program

## 2022-01-10 DIAGNOSIS — F4310 Post-traumatic stress disorder, unspecified: Secondary | ICD-10-CM | POA: Diagnosis not present

## 2022-01-10 DIAGNOSIS — F411 Generalized anxiety disorder: Secondary | ICD-10-CM | POA: Diagnosis not present

## 2022-01-10 DIAGNOSIS — F339 Major depressive disorder, recurrent, unspecified: Secondary | ICD-10-CM

## 2022-01-10 IMAGING — CT CT ABD-PELV W/ CM
2 of 9 series · 13 of 46 positions shown, 18 images · IV contrast (OMNIPAQUE 300)
Comparison: 07/05/2019 noncontrast abdominal CT

CLINICAL DATA: Abdominal abscess/infection suspected

EXAM:
CT ABDOMEN AND PELVIS WITH CONTRAST
TECHNIQUE: Multidetector CT imaging of the abdomen and pelvis was performed
using the standard protocol following bolus administration of
intravenous contrast.
CONTRAST:  100mL OMNIPAQUE IOHEXOL 300 MG/ML  SOLN

[Series 4: axial st · axial · 0.75mm/px · z∈[+1160,+1595]mm · 10 of 101 slices shown, 15 images]
[im 7/101  soft-tissue]
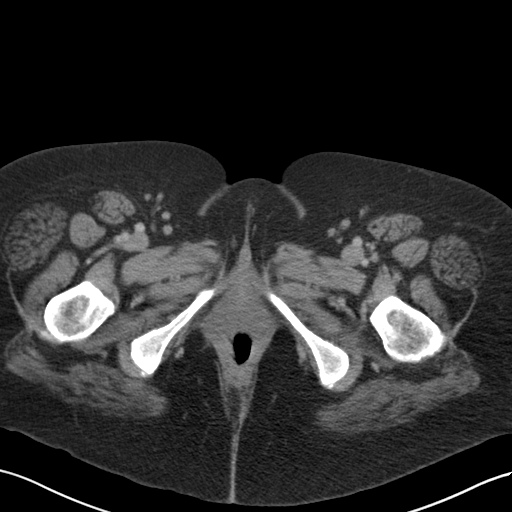
[im 7/101  bone]
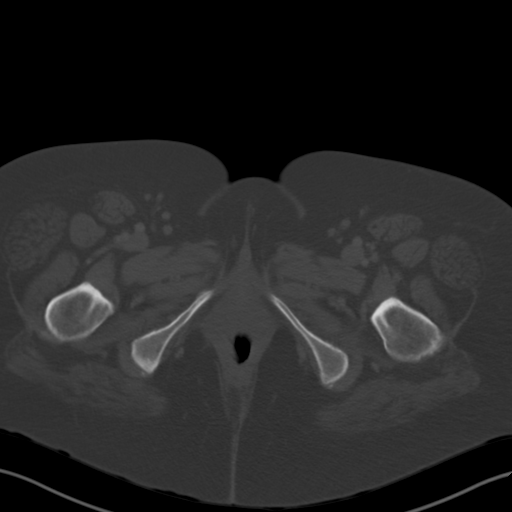
[im 21/101  soft-tissue]
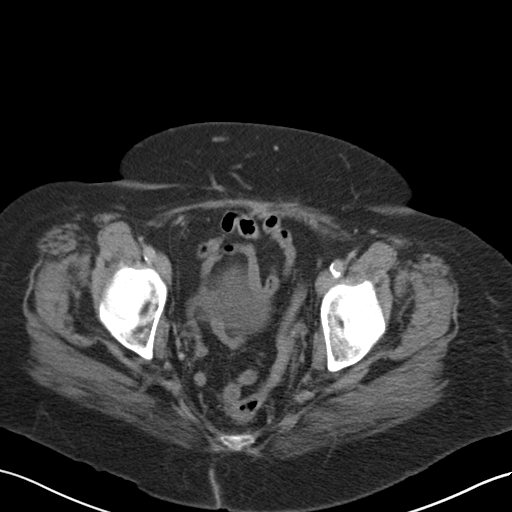
[im 27/101  soft-tissue]
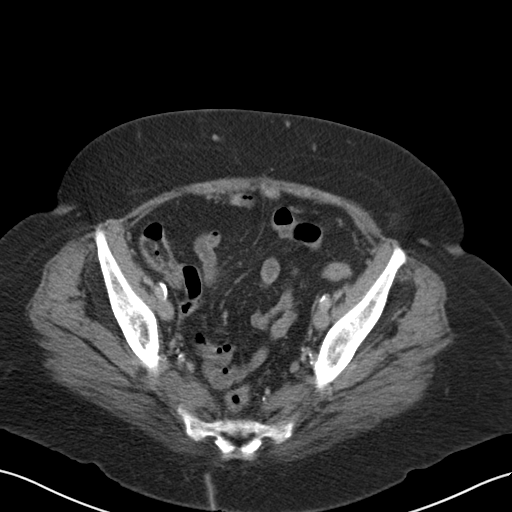
[im 41/101  soft-tissue]
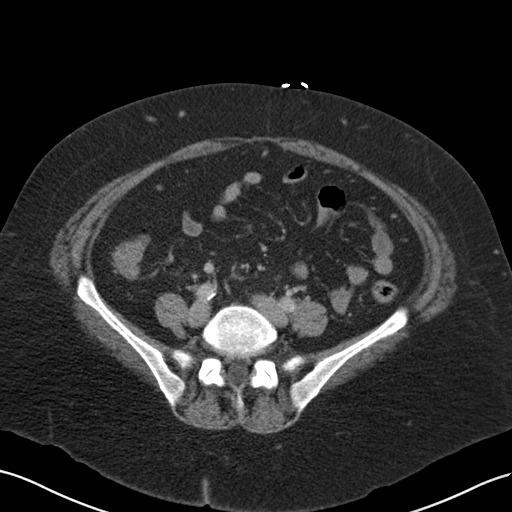
[im 54/101  soft-tissue]
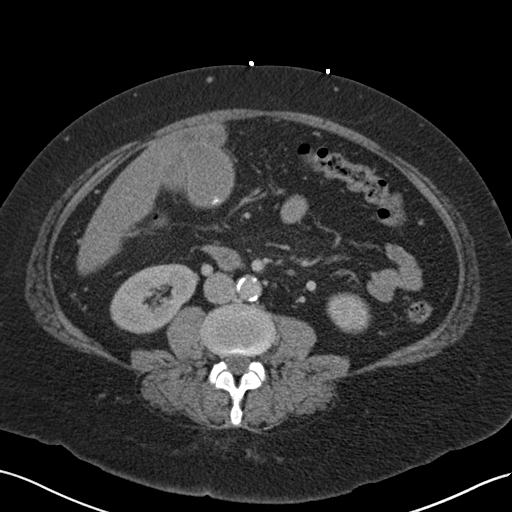
[im 61/101  soft-tissue]
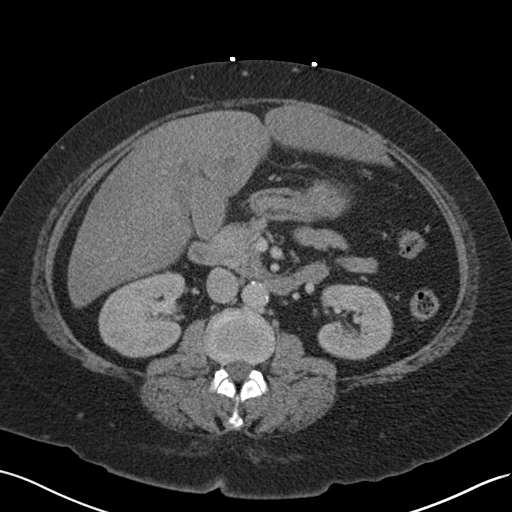
[im 74/101  soft-tissue]
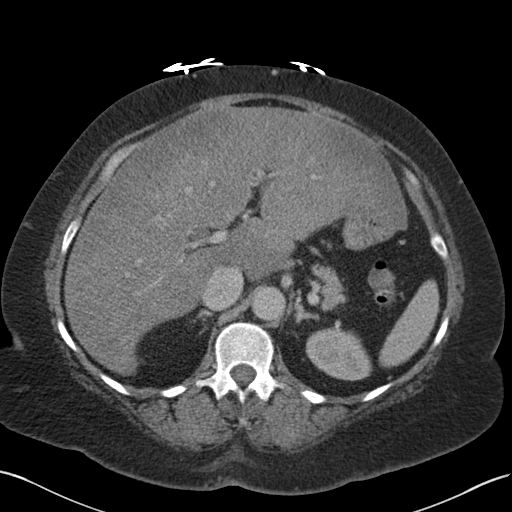
[im 74/101  lung]
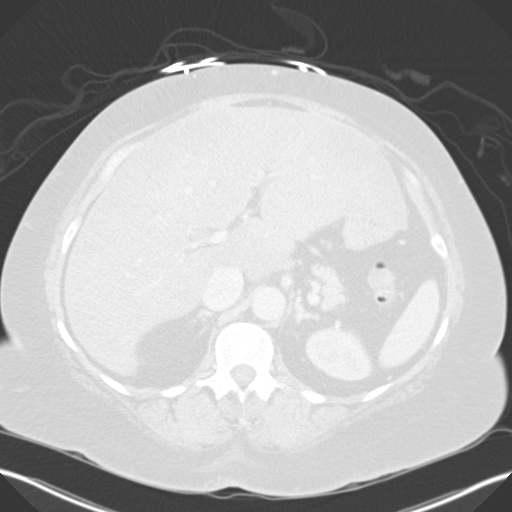
[im 81/101  soft-tissue]
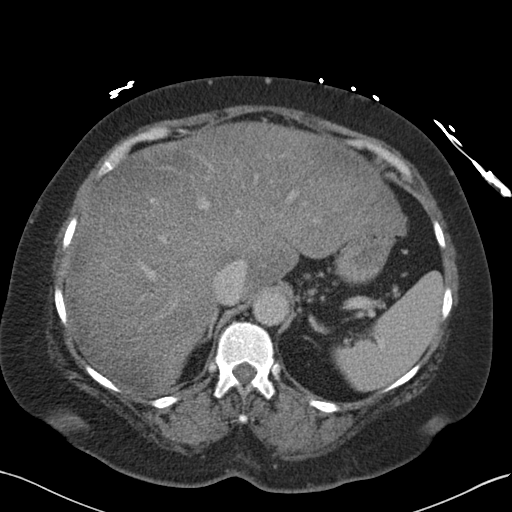
[im 81/101  lung]
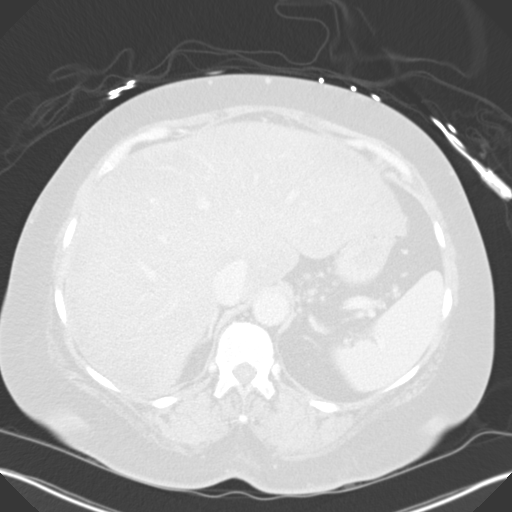
[im 87/101  lung]
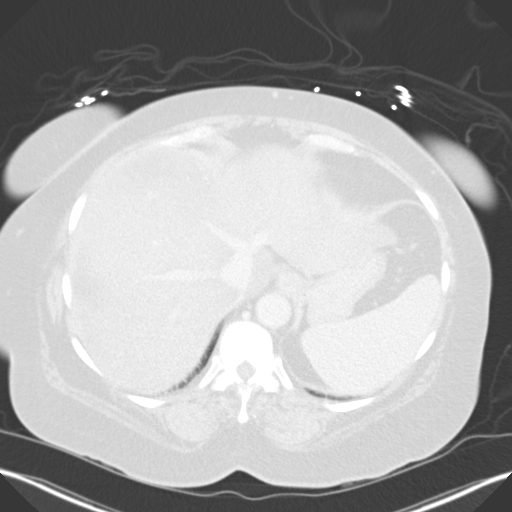
[im 94/101  soft-tissue]
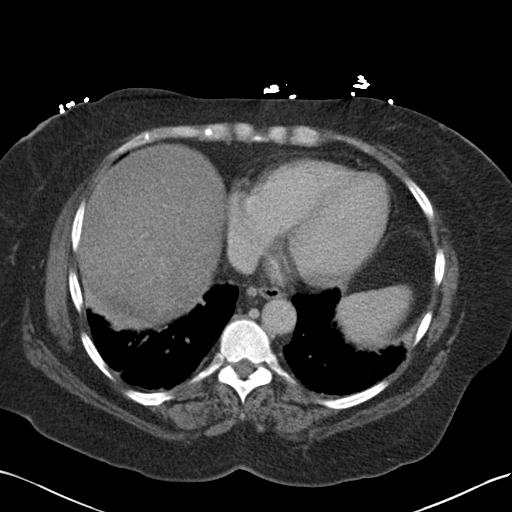
[im 94/101  lung]
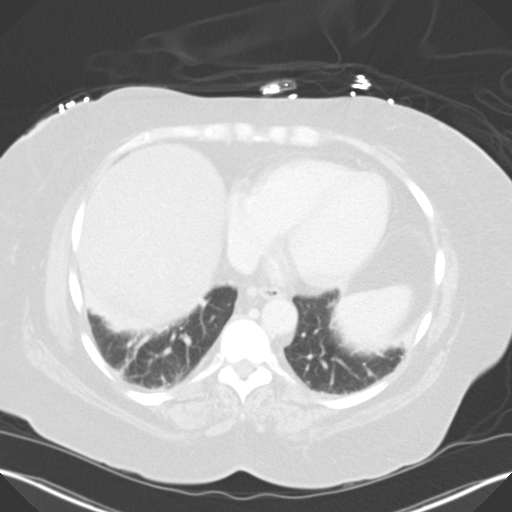
[im 94/101  bone]
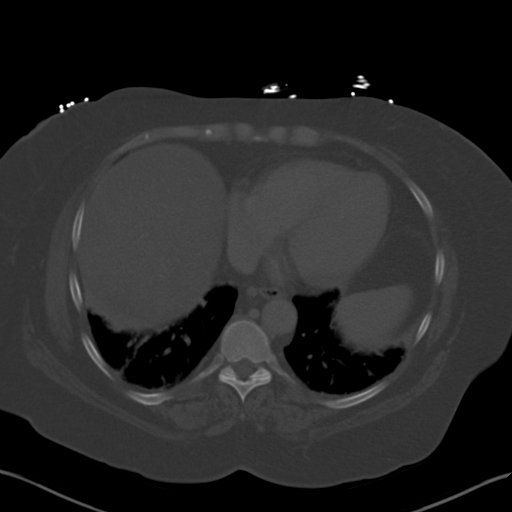

[Series 6: coronal st · coronal · 0.74mm/px · 3 of 164 slices shown]
[im 33/164  soft-tissue]
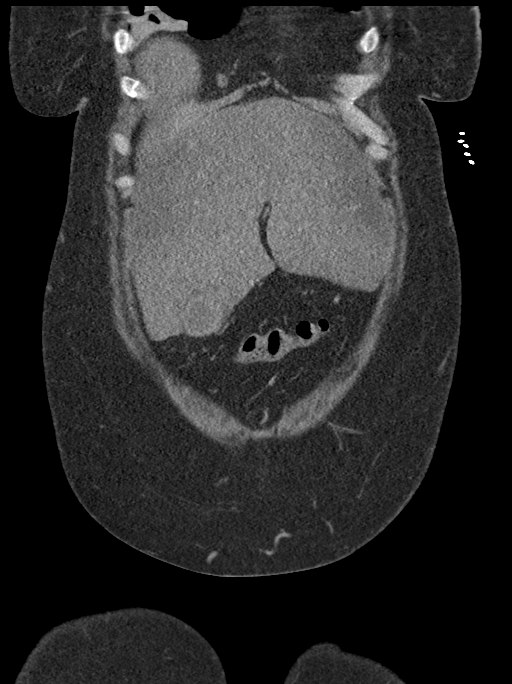
[im 66/164  soft-tissue]
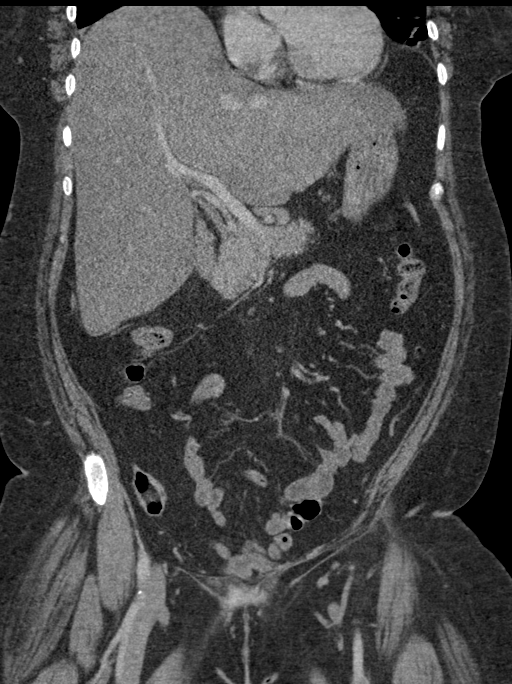
[im 98/164  soft-tissue]
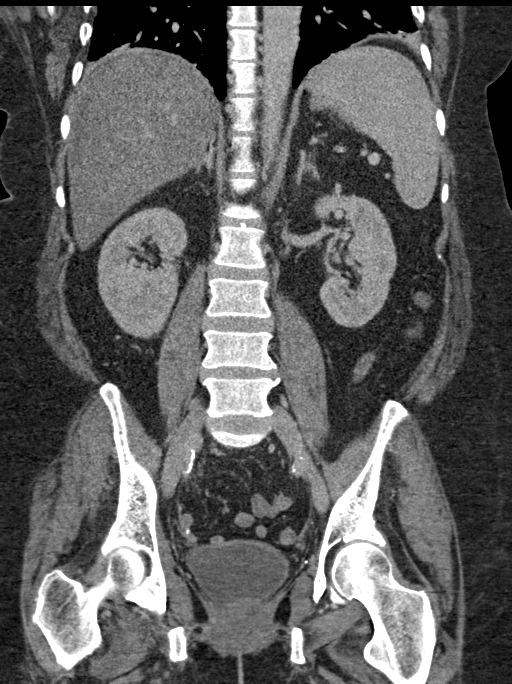

[13 of 46 positions shown; findings below may reference images not displayed]

FINDINGS: Lower chest: Atelectasis or scarring at the lung bases, also seen on
prior.

Hepatobiliary: Hepatic steatosis which is marked.Layering gallstone.
No detected acute inflammation or gallbladder over distension. No
bile duct dilatation.

Pancreas: Unremarkable.

Spleen: Nonspecific subcapsular low-density in the upper spleen,
likely subtly present on prior. In isolation, usually these lesions
are incidental and noncontributory.

Adrenals/Urinary Tract: Negative adrenals. No hydronephrosis or
stone. Unremarkable bladder.

Stomach/Bowel:  No obstruction. No evidence of bowel inflammation.

Vascular/Lymphatic: No acute vascular abnormality. Multifocal
atherosclerosis, notably extensive for age. No mass or adenopathy.

Reproductive:Hysterectomy.

Other: No ascites or pneumoperitoneum.

Musculoskeletal: No acute abnormalities.
IMPRESSION: 1. No acute finding.
2. Severe hepatic steatosis.
3. Cholelithiasis.
4. Atherosclerosis.

## 2022-01-10 MED ORDER — QUETIAPINE FUMARATE 100 MG PO TABS: 100.0000 mg | ORAL_TABLET | Freq: Every day | ORAL | 1 refills | Status: DC

## 2022-01-10 MED ORDER — MIRTAZAPINE 45 MG PO TABS: 45.0000 mg | ORAL_TABLET | Freq: Every day | ORAL | 1 refills | Status: DC

## 2022-01-10 MED ORDER — BUSPIRONE HCL 10 MG PO TABS: 10.0000 mg | ORAL_TABLET | Freq: Two times a day (BID) | ORAL | 1 refills | Status: DC

## 2022-01-10 NOTE — Progress Notes (Signed)
BH MD/PA/NP OP Progress Note  Virtual Visit via Telephone Note  I connected with Lydia Vasquez on 01/10/22 at  3:00 PM EST by telephone and verified that I am speaking with the correct person using two identifiers.  Location: Patient: Home Provider: [facility]   I discussed the limitations, risks, security and privacy concerns of performing an evaluation and management service by telephone and the availability of in person appointments. I also discussed with the patient that there may be a patient responsible charge related to this service. The patient expressed understanding and agreed to proceed.  01/10/2022 4:44 PM Lydia Vasquez  MRN:  [id number]  Chief Complaint:  Chief Complaint  Patient presents with   Follow-up   Anxiety   Depression   HPI:   Lydia Vasquez is a 61 yr old female who presents via telephone for follow up and medication management.  PPHx is significant for MDD, Recurrent, Anxiety, PTSD and Insomnia, and no prior hospitalizations or suicide attempts.   He reports that she is doing okay today.  She reports she did notice a positive improvement with the change in her Remeron.  She reports her anxiety has shown some improvement and her sleep has improved.  She reports no side effects to her medications.  She reports that she has not noticed a significant improvement with the BuSpar.  Discussed trialing an increase and she was agreeable to this.  She reports that her grandson continues to be a significant bright spot for her and whenever she is down thinking of him improves her mood.  She reports no SI, HI, or AVH.  She reports her sleep is good.  She reports her appetite is fair.  She reports no other concerns at present.  She will return for follow-up in approximately 4 weeks.   Visit Diagnosis:    ICD-10-CM   1. PTSD (post-traumatic stress disorder)  F43.10 QUEtiapine (SEROQUEL) 100 MG tablet    mirtazapine (REMERON) 45 MG tablet    2. Major depression, recurrent,  chronic (HCC)  F33.9 QUEtiapine (SEROQUEL) 100 MG tablet    mirtazapine (REMERON) 45 MG tablet    3. GAD (generalized anxiety disorder)  F41.1 busPIRone (BUSPAR) 10 MG tablet      Past Psychiatric History: MDD, Recurrent, Anxiety, PTSD and Insomnia, and no prior hospitalizations or suicide attempts.    Past Medical History:  Past Medical History:  Diagnosis Date   Anxiety    Depression    Hyperlipidemia    Hypertension    Ventricular tachycardia (HCC) 06/11/2020    Past Surgical History:  Procedure Laterality Date   ABDOMINAL HYSTERECTOMY     CESAREAN SECTION      Family Psychiatric History: Maternal Cousin- Schizophrenia or Bipolar Disorder No known Substance Abuse or Suicides.  Family History:  Family History  Problem Relation Age of Onset   Stroke Mother    Dementia Mother     Social History:  Social History   Socioeconomic History   Marital status: Single    Spouse name: Not on file   Number of children: Not on file   Years of education: Not on file   Highest education level: Not on file  Occupational History   Not on file  Tobacco Use   Smoking status: Former    Types: Cigarettes   Smokeless tobacco: Never  Vaping Use   Vaping Use: Never used  Substance and Sexual Activity   Alcohol use: Not Currently   Drug use: Never   Sexual activity: Not  on file  Other Topics Concern   Not on file  Social History Narrative   Not on file   Social Determinants of Health   Financial Resource Strain: Low Risk  (02/12/2021)   Overall Financial Resource Strain (CARDIA)    Difficulty of Paying Living Expenses: Not hard at all  Food Insecurity: Food Insecurity Present (02/12/2021)   Hunger Vital Sign    Worried About Running Out of Food in the Last Year: Sometimes true    Ran Out of Food in the Last Year: Sometimes true  Transportation Needs: Unmet Transportation Needs (02/12/2021)   PRAPARE - Administrator, Civil ServiceTransportation    Lack of Transportation (Medical): Yes    Lack of  Transportation (Non-Medical): Yes  Physical Activity: Inactive (02/12/2021)   Exercise Vital Sign    Days of Exercise per Week: 0 days    Minutes of Exercise per Session: 0 min  Stress: Stress Concern Present (02/12/2021)   [employer] - Occupational Stress Questionnaire    Feeling of Stress : Very much  Social Connections: Socially Isolated (02/12/2021)   Social Connection and Isolation Panel [NHANES]    Frequency of Communication with Friends and Family: Once a week    Frequency of Social Gatherings with Friends and Family: More than three times a week    Attends Religious Services: Never    Database administratorActive Member of Clubs or Organizations: No    Attends Engineer, structuralClub or Organization Meetings: Never    Marital Status: Never married    Allergies: No Known Allergies  Metabolic Disorder Labs: No results found for: "HGBA1C", "MPG" No results found for: "PROLACTIN" Lab Results  Component Value Date   CHOL 192 10/26/2019   TRIG 332 (H) 10/26/2019   HDL <10 (L) 10/26/2019   CHOLHDL NOT CALCULATED 10/26/2019   VLDL 66 (H) 10/26/2019   LDLCALC NOT CALCULATED 10/26/2019   No results found for: "TSH"  Therapeutic Level Labs: No results found for: "LITHIUM" No results found for: "VALPROATE" No results found for: "CBMZ"  Current Medications: Current Outpatient Medications  Medication Sig Dispense Refill   B Complex-C-Folic Acid (B COMPLEX-VITAMIN C-FOLIC ACID) 1 MG tablet Take 1 tablet by mouth daily with breakfast. 30 tablet 2   busPIRone (BUSPAR) 10 MG tablet Take 1 tablet (10 mg total) by mouth 2 (two) times daily. 60 tablet 1   famotidine (PEPCID) 20 MG tablet TAKE 1 TABLET(20 MG) BY MOUTH TWICE DAILY 180 tablet 1   metoprolol tartrate (LOPRESSOR) 25 MG tablet TAKE 1 TABLET(25 MG) BY MOUTH TWICE DAILY 180 tablet 0   mirtazapine (REMERON) 45 MG tablet Take 1 tablet (45 mg total) by mouth at bedtime. 30 tablet 1   QUEtiapine (SEROQUEL) 100 MG tablet Take 1 tablet (100 mg  total) by mouth at bedtime. 30 tablet 1   No current facility-administered medications for this visit.     Musculoskeletal: Strength & Muscle Tone: Could not assess due to phone visit Gait & Station: Could not assess due to phone visit Patient leans: Could not assess due to phone visit  Psychiatric Specialty Exam: Review of Systems  Respiratory:  Negative for shortness of breath.   Cardiovascular:  Negative for chest pain.  Gastrointestinal:  Negative for abdominal pain, constipation, diarrhea, nausea and vomiting.  Neurological:  Negative for dizziness, weakness and headaches.  Psychiatric/Behavioral:  Positive for dysphoric mood. Negative for hallucinations, sleep disturbance and suicidal ideas. The patient is nervous/anxious.     There were no vitals taken for this visit.There is no  height or weight on file to calculate BMI.  General Appearance: Could not assess due to phone visit  Eye Contact:  Could not assess due to phone visit  Speech:  Clear and Coherent and Normal Rate  Volume:  Normal  Mood:  Anxious and Dysphoric  Affect:  Congruent  Thought Process:  Coherent and Goal Directed  Orientation:  Full (Time, Place, and Person)  Thought Content: WDL and Logical   Suicidal Thoughts:  No  Homicidal Thoughts:  No  Memory:  Immediate;   Good Recent;   Good  Judgement:  Good  Insight:  Good  Psychomotor Activity:  Normal  Concentration:  Concentration: Good and Attention Span: Good  Recall:  Good  Fund of Knowledge: Good  Language: Good  Akathisia:  Could not assess due to phone visit  Handed:  Right  AIMS (if indicated): Could not assess due to phone visit  Assets:  Communication Skills Desire for Improvement Housing Leisure Time Resilience Social Support  ADL's:  Intact  Cognition: WNL  Sleep:  Good   Screenings: GAD-7    Advertising copywriter from 10/09/2021 in [facility] Counselor from 09/10/2021 in [facility] Counselor from 06/19/2021 in [facility] Counselor from 04/10/2021 in [facility] Office Visit from 04/08/2021 in [facility]  Total GAD-7 Score 18 14 19 21 20       PHQ2-9    Flowsheet Row Counselor from 10/09/2021 in [facility] Counselor from 09/10/2021 in [facility] Counselor from 06/19/2021 in [facility] Counselor from 04/10/2021 in [facility] Office Visit from 04/08/2021 in [facility]  PHQ-2 Total Score 3 6 3 6 6   PHQ-9 Total Score 17 19 20 23 23       Flowsheet Row Counselor from 10/09/2021 in [facility] Counselor from 09/10/2021 in [facility] Counselor from 06/19/2021 in [facility]  C-SSRS RISK CATEGORY Low Risk Low Risk Moderate Risk        Assessment and Plan:  Lydia Vasquez is a 61 yr old female who presents via telephone for follow up and medication management.  PPHx is significant for MDD, Recurrent, Anxiety, PTSD and Insomnia, and no prior hospitalizations or suicide attempts.     Sahily has responded well to the increase of her Remeron made last time.  Her anxiety does continue to be high so we will increase her Buspar.  We will not make any other changes to her medications at this time.  She will return for follow up in approximately 4 weeks.   MDD, Recurrent, Chronic  PTSD  GAD  Insomnia : -Continue Remeron 45 mg QHS for depression, anxiety, and sleep. 30 tablets with 1 refill. -Continue Seroquel 100 mg QHS for depression, anxiety, and sleep. 30 tablets with 1 refill. -Increase Buspar to 10 mg BID for anxiety.  60 tablets with 1 refill sent.    Collaboration of Care:   Patient/Guardian was advised Release of Information must be obtained  prior to any record release in order to collaborate their care with an outside provider. Patient/Guardian was advised if they have not already done so to contact the registration department to sign all necessary forms in order for [facility] to release information regarding their care.   Consent: Patient/Guardian gives verbal consent for treatment and assignment  of benefits for services provided during this visit. Patient/Guardian expressed understanding and agreed to proceed.    Lauro Franklin, MD 01/10/2022, 4:44 PM     Follow Up Instructions:    I discussed the assessment and treatment plan with the patient. The patient was provided an opportunity to ask questions and all were answered. The patient agreed with the plan and demonstrated an understanding of the instructions.   The patient was advised to call back or seek an in-person evaluation if the symptoms worsen or if the condition fails to improve as anticipated.  I provided 10 minutes of non-face-to-face time during this encounter.   Lauro Franklin, MD

## 2022-01-23 ENCOUNTER — Ambulatory Visit: Payer: Medicaid Other | Admitting: Physician Assistant

## 2022-01-31 ENCOUNTER — Ambulatory Visit: Payer: Self-pay

## 2022-01-31 NOTE — Telephone Encounter (Signed)
  Chief Complaint: mild URI s/s - should she cancel her Monday appt. Symptoms: Mild URI Frequency:  Pertinent Negatives: Patient denies  Disposition: [] ED /[] Urgent Care (no appt availability in office) / [] Appointment(In office/virtual)/ []  [facility]/ [] Home Care/ [] Refused Recommended Disposition /[] [facility]/ [x]  Follow-up with PCP Additional Notes: PT has an in office appt scheduled for Monday. PT is experiencing mild cold s/s. Pt was thinking of cancelling appt. Pt will keep appt. Home care advice for cold given.    Summary: cold like symptoms   The patient has experienced cold like symptoms since 01/30/22  The patient has experienced a runny nose, cough, and has been in close contact with someone with a cold  Please contact the patient further when possible     Reason for Disposition  General information question, no triage required and triager able to answer question  Answer Assessment - Initial Assessment Questions 1. REASON FOR CALL or QUESTION: "What is your reason for calling today?" or "How can I best help you?" or "What question do you have that I can help answer?"     Patient wanted to know if she should cancel her appt because she is experiencing mild URI s/s.  Protocols used: Information Only Call - No Triage-A-AH

## 2022-02-06 DIAGNOSIS — Z7689 Persons encountering health services in other specified circumstances: Secondary | ICD-10-CM | POA: Diagnosis not present

## 2022-02-13 ENCOUNTER — Other Ambulatory Visit: Payer: Self-pay | Admitting: Critical Care Medicine

## 2022-02-14 ENCOUNTER — Encounter (HOSPITAL_COMMUNITY): Payer: Self-pay

## 2022-02-14 ENCOUNTER — Encounter (HOSPITAL_COMMUNITY): Payer: Medicaid Other | Admitting: Student in an Organized Health Care Education/Training Program

## 2022-02-14 NOTE — Telephone Encounter (Signed)
Requested Prescriptions  Refused Prescriptions Disp Refills   metoprolol tartrate (LOPRESSOR) 25 MG tablet [Pharmacy Med Name: METOPROLOL TARTRATE 25MG  TABLETS] 180 tablet 0    Sig: TAKE 1 TABLET(25 MG) BY MOUTH TWICE DAILY     Cardiovascular:  Beta Blockers Failed - 02/13/2022  5:29 PM      Failed - Valid encounter within last 6 months    Recent Outpatient Visits           10 months ago BMI 45.0-49.9, adult [facility])   [place] Elsie Stain, MD   1 year ago Major depression, recurrent, chronic [facility])   [place] Elsie Stain, MD   1 year ago Alcoholic cirrhosis of liver without ascites [facility])   [place] Elsie Stain, MD       Future Appointments             In 2 weeks Elsie Stain, MD [place] BP in normal range    BP Readings from Last 1 Encounters:  04/08/21 135/85         Passed - Last Heart Rate in normal range    Pulse Readings from Last 1 Encounters:  04/08/21 79

## 2022-02-14 NOTE — Progress Notes (Signed)
This encounter was created in error - please disregard.

## 2022-02-28 ENCOUNTER — Telehealth (HOSPITAL_COMMUNITY): Payer: Medicaid Other | Admitting: Student in an Organized Health Care Education/Training Program

## 2022-03-06 ENCOUNTER — Ambulatory Visit: Payer: Medicaid Other | Admitting: Critical Care Medicine
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
## 2022-03-07 ENCOUNTER — Ambulatory Visit (INDEPENDENT_AMBULATORY_CARE_PROVIDER_SITE_OTHER): Payer: Medicaid Other | Admitting: Student in an Organized Health Care Education/Training Program

## 2022-03-07 ENCOUNTER — Encounter (HOSPITAL_COMMUNITY): Payer: Self-pay | Admitting: Student in an Organized Health Care Education/Training Program

## 2022-03-07 VITALS — BP 136/97 | HR 67 | Ht 70.5 in | Wt 305.6 lb

## 2022-03-07 DIAGNOSIS — F4310 Post-traumatic stress disorder, unspecified: Secondary | ICD-10-CM | POA: Diagnosis not present

## 2022-03-07 DIAGNOSIS — F339 Major depressive disorder, recurrent, unspecified: Secondary | ICD-10-CM | POA: Diagnosis not present

## 2022-03-07 DIAGNOSIS — F411 Generalized anxiety disorder: Secondary | ICD-10-CM | POA: Diagnosis not present

## 2022-03-07 MED ORDER — BUSPIRONE HCL 10 MG PO TABS: 10.0000 mg | ORAL_TABLET | Freq: Two times a day (BID) | ORAL | 1 refills | Status: DC

## 2022-03-07 MED ORDER — ESCITALOPRAM OXALATE 10 MG PO TABS: 10.0000 mg | ORAL_TABLET | Freq: Every day | ORAL | 1 refills | Status: DC

## 2022-03-07 MED ORDER — MIRTAZAPINE 45 MG PO TABS: 45.0000 mg | ORAL_TABLET | Freq: Every day | ORAL | 1 refills | Status: DC

## 2022-03-07 MED ORDER — QUETIAPINE FUMARATE 100 MG PO TABS: 100.0000 mg | ORAL_TABLET | Freq: Every day | ORAL | 1 refills | Status: DC

## 2022-03-07 NOTE — Progress Notes (Cosign Needed)
BH MD/PA/NP OP Progress Note  03/07/2022 2:56 PM Lydia Vasquez  MRN:  [id number]  Chief Complaint:  Chief Complaint  Patient presents with   Follow-up   Anxiety   Depression   HPI:  Lydia Vasquez is a 62 yr old female who presents for Follow Up and Medication Management.  PPHx is significant for MDD, Recurrent, Anxiety, PTSD and Insomnia, and no history of Suicide Attempts, Self Injurious Behavior, or Psychiatric Hospitalizations.     She reports that things continue to be tough.  She reports that recently she went to go see her brother who is doing quite bad.  She reports she knew would be upsetting but she had not seen him since their mother's death.  She reports her anxiety and depression continue to be significant.  She reports having occasional SI with no plan/intent but she always pushes back against that immediately with the thought of her grandson and rest of her family.  Discussed starting Lexapro.  Discussed potential risks and side effects and she was agreeable to a trial.  Encouraged her to restart therapy as she had stopped going and she reports she will make an appointment when she checked out today.  Discussed with her that her blood pressure is very high.  She reports that she saw her PCP last month and does not have an appointment until around May.  Encouraged her to call her PCP to schedule an appointment sooner and she reports that she will.  She reports no SI, HI, or AVH.  She reports her sleep is fair.  She reports her appetite is good.  She reports no other concerns at present.  She will return for follow-up in approximately 6 weeks.   Visit Diagnosis:    ICD-10-CM   1. GAD (generalized anxiety disorder)  F41.1 busPIRone (BUSPAR) 10 MG tablet    escitalopram (LEXAPRO) 10 MG tablet    2. PTSD (post-traumatic stress disorder)  F43.10 QUEtiapine (SEROQUEL) 100 MG tablet    mirtazapine (REMERON) 45 MG tablet    escitalopram (LEXAPRO) 10 MG tablet    3. Major depression,  recurrent, chronic (HCC)  F33.9 QUEtiapine (SEROQUEL) 100 MG tablet    mirtazapine (REMERON) 45 MG tablet    escitalopram (LEXAPRO) 10 MG tablet      Past Psychiatric History: MDD, Recurrent, Anxiety, PTSD and Insomnia, and no history of Suicide Attempts, Self Injurious Behavior, or Psychiatric Hospitalizations.  Past Medical History:  Past Medical History:  Diagnosis Date   Anxiety    Depression    Hyperlipidemia    Hypertension    Ventricular tachycardia ([place]) 06/11/2020    Past Surgical History:  Procedure Laterality Date   ABDOMINAL HYSTERECTOMY     CESAREAN SECTION      Family Psychiatric History: Maternal Cousin- Schizophrenia or Bipolar Disorder No known Substance Abuse or Suicides.  Family History:  Family History  Problem Relation Age of Onset   Stroke Mother    Dementia Mother     Social History:  Social History   Socioeconomic History   Marital status: Single    Spouse name: Not on file   Number of children: Not on file   Years of education: Not on file   Highest education level: Not on file  Occupational History   Not on file  Tobacco Use   Smoking status: Former    Types: Cigarettes   Smokeless tobacco: Never  Vaping Use   Vaping Use: Never used  Substance and Sexual Activity   Alcohol  use: Not Currently   Drug use: Never   Sexual activity: Not on file  Other Topics Concern   Not on file  Social History Narrative   Not on file   Social Determinants of Health   Financial Resource Strain: Low Risk  (02/12/2021)   Overall Financial Resource Strain (CARDIA)    Difficulty of Paying Living Expenses: Not hard at all  Food Insecurity: Food Insecurity Present (02/12/2021)   Hunger Vital Sign    Worried About Running Out of Food in the Last Year: Sometimes true    Ran Out of Food in the Last Year: Sometimes true  Transportation Needs: Unmet Transportation Needs (02/12/2021)   PRAPARE - Hydrologist (Medical): Yes     Lack of Transportation (Non-Medical): Yes  Physical Activity: Inactive (02/12/2021)   Exercise Vital Sign    Days of Exercise per Week: 0 days    Minutes of Exercise per Session: 0 min  Stress: Stress Concern Present (02/12/2021)   [place]    Feeling of Stress : Very much  Social Connections: Socially Isolated (02/12/2021)   Social Connection and Isolation Panel [NHANES]    Frequency of Communication with Friends and Family: Once a week    Frequency of Social Gatherings with Friends and Family: More than three times a week    Attends Religious Services: Never    Marine scientist or Organizations: No    Attends Music therapist: Never    Marital Status: Never married    Allergies: No Known Allergies  Metabolic Disorder Labs: No results found for: "HGBA1C", "MPG" No results found for: "PROLACTIN" Lab Results  Component Value Date   CHOL 192 10/26/2019   TRIG 332 (H) 10/26/2019   HDL <10 (L) 10/26/2019   CHOLHDL NOT CALCULATED 10/26/2019   VLDL 66 (H) 10/26/2019   [place] NOT CALCULATED 10/26/2019   No results found for: "TSH"  Therapeutic Level Labs: No results found for: "LITHIUM" No results found for: "VALPROATE" No results found for: "CBMZ"  Current Medications: Current Outpatient Medications  Medication Sig Dispense Refill   escitalopram (LEXAPRO) 10 MG tablet Take 1 tablet (10 mg total) by mouth daily. Take a half tablet for 7 days then increase to a whole tablet. 30 tablet 1   B Complex-C-Folic Acid (B COMPLEX-VITAMIN C-FOLIC ACID) 1 MG tablet Take 1 tablet by mouth daily with breakfast. 30 tablet 2   busPIRone (BUSPAR) 10 MG tablet Take 1 tablet (10 mg total) by mouth 2 (two) times daily. 60 tablet 1   famotidine (PEPCID) 20 MG tablet TAKE 1 TABLET(20 MG) BY MOUTH TWICE DAILY 180 tablet 1   metoprolol tartrate (LOPRESSOR) 25 MG tablet TAKE 1 TABLET(25 MG) BY MOUTH TWICE DAILY 180  tablet 0   mirtazapine (REMERON) 45 MG tablet Take 1 tablet (45 mg total) by mouth at bedtime. 30 tablet 1   QUEtiapine (SEROQUEL) 100 MG tablet Take 1 tablet (100 mg total) by mouth at bedtime. 30 tablet 1   No current facility-administered medications for this visit.     Musculoskeletal: Strength & Muscle Tone: within normal limits Gait & Station: normal Patient leans: N/A  Psychiatric Specialty Exam: Review of Systems  Respiratory:  Negative for shortness of breath.   Cardiovascular:  Negative for chest pain.  Gastrointestinal:  Negative for abdominal pain, constipation, diarrhea, nausea and vomiting.  Neurological:  Negative for dizziness, weakness and headaches.  Psychiatric/Behavioral:  Negative  for dysphoric mood, hallucinations, sleep disturbance and suicidal ideas. The patient is nervous/anxious.     Blood pressure (!) 136/97, pulse 67, height 5' 10.5" (1.791 m), weight (!) 305 lb 9.6 oz (138.6 kg), SpO2 96 %.Body mass index is 43.23 kg/m.  General Appearance: Casual and Fairly Groomed  Eye Contact:  Good  Speech:  Clear and Coherent and Normal Rate  Volume:  Normal  Mood:  Anxious  Affect:  Congruent  Thought Process:  Coherent and Goal Directed  Orientation:  Full (Time, Place, and Person)  Thought Content: WDL and Logical   Suicidal Thoughts:  No  Homicidal Thoughts:  No  Memory:  Immediate;   Good Recent;   Good  Judgement:  Good  Insight:  Good  Psychomotor Activity:  Normal  Concentration:  Concentration: Good and Attention Span: Good  Recall:  Good  Fund of Knowledge: Good  Language: Good  Akathisia:  Negative  Handed:  Right  AIMS (if indicated): done  Assets:  Communication Skills Desire for Improvement Housing Leisure Time Resilience Social Support  ADL's:  Intact  Cognition: WNL  Sleep:  Fair   Screenings: GAD-7    Health and safety inspector from 10/09/2021 in [facility] Counselor from 09/10/2021 in [facility] Counselor from 06/19/2021 in [facility] Counselor from 04/10/2021 in [facility] Office Visit from 04/08/2021 in [place]  Total GAD-7 Score 18 14 19 21 20      $ PHQ2-9    Flowsheet Row Counselor from 10/09/2021 in [facility] Counselor from 09/10/2021 in [facility] Counselor from 06/19/2021 in [facility] Counselor from 04/10/2021 in [facility] Office Visit from 04/08/2021 in [place]  PHQ-2 Total Score 3 6 3 6 6  $ PHQ-9 Total Score 17 19 20 23 23      $ Flowsheet Row Counselor from 10/09/2021 in [facility] Counselor from 09/10/2021 in [facility] Counselor from 06/19/2021 in [place] Moderate Risk        Assessment and Plan:  Taelynn Bangs is a 62 yr old female who presents for Follow Up and Medication Management.  PPHx is significant for MDD, Recurrent, Anxiety, PTSD and Insomnia, and no history of Suicide Attempts, Self Injurious Behavior, or Psychiatric Hospitalizations.   Maurita continues to have significant anxiety due to on going stressors revolving around her family's health as well as her own.  We will start Lexapro at this time to hopefully better manage her anxiety and depression.  Her blood pressure was fairly elevated so she will call her PCP to follow up on this.  We will not make any other changes to her medication at this time.  She will return follow-up approximate 6 weeks.   MDD, Recurrent, Chronic  PTSD  GAD  Insomnia : -Continue Remeron 45 mg QHS for depression, anxiety, and sleep. 30 tablets with 1 refill. -Start Lexapro 5 mg daily for 7 days then increase to 10 mg  daily for depression and anxiety.  30 (10 mg) tablets with 1 refill. -Continue Seroquel 100 mg QHS for depression, anxiety, and sleep. 30 tablets with 1 refill. -Increase Buspar to 10 mg BID for anxiety.  60 tablets with 1 refill sent.   Collaboration of Care: Collaboration  of Care: Other provider involved in patient's care [facility] Therapist  Patient/Guardian was advised Release of Information must be obtained prior to any record release in order to collaborate their care with an outside provider. Patient/Guardian was advised if they have not already done so to contact the registration department to sign all necessary forms in order for [place] to release information regarding their care.   Consent: Patient/Guardian gives verbal consent for treatment and assignment of benefits for services provided during this visit. Patient/Guardian expressed understanding and agreed to proceed.    Briant Cedar, MD 03/07/2022, 2:56 PM

## 2022-04-03 ENCOUNTER — Other Ambulatory Visit: Payer: Self-pay | Admitting: Critical Care Medicine

## 2022-04-18 ENCOUNTER — Encounter (HOSPITAL_COMMUNITY): Payer: Medicaid Other | Admitting: Student in an Organized Health Care Education/Training Program

## 2022-04-23 ENCOUNTER — Ambulatory Visit: Payer: Self-pay | Admitting: *Deleted

## 2022-04-23 NOTE — Telephone Encounter (Signed)
Reason for Disposition  [1] MODERATE pain (e.g., interferes with normal activities, limping) AND [2] present > 3 days  Answer Assessment - Initial Assessment Questions 1. LOCATION and RADIATION: "Where is the pain located?"      I'm having pain in both knees.   I don't know what is wrong. 2. QUALITY: "What does the pain feel like?"  (e.g., sharp, dull, aching, burning)     It feels like bone on bone.   I've had it for a while but it's getting worse.  3. SEVERITY: "How bad is the pain?" "What does it keep you from doing?"   (Scale 1-10; or mild, moderate, severe)   -  MILD (1-3): doesn't interfere with normal activities    -  MODERATE (4-7): interferes with normal activities (e.g., work or school) or awakens from sleep, limping    -  SEVERE (8-10): excruciating pain, unable to do any normal activities, unable to walk     Severe  No way to get to the [facility] today.    I've tried ice and ibuprofen but nothing is helping.  4. ONSET: "When did the pain start?" "Does it come and go, or is it there all the time?"     I've had this for a while but it just seems to be getting worse. 5. RECURRENT: "Have you had this pain before?" If Yes, ask: "When, and what happened then?"     I think it's bone on bone. 6. SETTING: "Has there been any recent work, exercise or other activity that involved that part of the body?"      No 7. AGGRAVATING FACTORS: "What makes the knee pain worse?" (e.g., walking, climbing stairs, running)     It hurts to try and get up from sitting. 8. ASSOCIATED SYMPTOMS: "Is there any swelling or redness of the knee?"     Just very painful and stiff 9. OTHER SYMPTOMS: "Do you have any other symptoms?" (e.g., chest pain, difficulty breathing, fever, calf pain)     Not asked 10. PREGNANCY: "Is there any chance you are pregnant?" "When was your last menstrual period?"       N/A due to age  Protocols used: Knee Pain-A-AH

## 2022-04-23 NOTE — Telephone Encounter (Addendum)
  Chief Complaint: Bilateral knee pain that is chronic but is getting worse lately. Symptoms: Painful stiff knees.   Ice and ibuprofen are not helping the pain at all. Frequency: "For a while now but getting worse lately" Pertinent Negatives: Patient denies N/A Disposition: [] ED /[] Urgent Care (no appt availability in office) / [x] Appointment(In office/virtual)/ []  [place] [facility]/ [] Home Care/ [] Refused Recommended Disposition /[] [place] [facility]/ []  Follow-up with PCP Additional Notes: No appts with [facility] until April 18 so I have sent a note as well as the agent sent a CRM to see if she can be worked in sooner.    Pt agreeable to this plan  I sent a Teams message to Evelena Asa, RN flow coordinator to see if she could be scheduled sooner.   Pt put on the wait list too.

## 2022-04-24 NOTE — Telephone Encounter (Signed)
Patient states she was unsure of the dates she could come to see Dr. Joya Gaskins next week d/t previous scheduled appt that she does not want to miss. She states she will call the office when she is sure. Advised of moble unit operations on Monday. She will call office to follow up on the days that are approaching to get the locations.

## 2022-05-01 ENCOUNTER — Encounter (HOSPITAL_COMMUNITY): Payer: Self-pay | Admitting: Student in an Organized Health Care Education/Training Program

## 2022-05-01 ENCOUNTER — Ambulatory Visit (INDEPENDENT_AMBULATORY_CARE_PROVIDER_SITE_OTHER): Payer: Medicaid Other | Admitting: Student in an Organized Health Care Education/Training Program

## 2022-05-01 VITALS — BP 137/86 | HR 68 | Ht 70.5 in | Wt 298.6 lb

## 2022-05-01 DIAGNOSIS — F411 Generalized anxiety disorder: Secondary | ICD-10-CM

## 2022-05-01 DIAGNOSIS — F4310 Post-traumatic stress disorder, unspecified: Secondary | ICD-10-CM

## 2022-05-01 DIAGNOSIS — F339 Major depressive disorder, recurrent, unspecified: Secondary | ICD-10-CM | POA: Diagnosis not present

## 2022-05-01 MED ORDER — VENLAFAXINE HCL ER 37.5 MG PO CP24
37.5000 mg | ORAL_CAPSULE | Freq: Every day | ORAL | 1 refills | Status: DC
Start: 2022-05-08 — End: 2022-08-29

## 2022-05-01 MED ORDER — MIRTAZAPINE 45 MG PO TABS: 45.0000 mg | ORAL_TABLET | Freq: Every day | ORAL | 1 refills | Status: DC

## 2022-05-01 MED ORDER — ESCITALOPRAM OXALATE 10 MG PO TABS: 5.0000 mg | ORAL_TABLET | Freq: Every day | ORAL | Status: DC

## 2022-05-01 MED ORDER — BUSPIRONE HCL 10 MG PO TABS
5.0000 mg | ORAL_TABLET | Freq: Two times a day (BID) | ORAL | Status: AC
Start: 2022-05-02 — End: 2022-05-07

## 2022-05-01 MED ORDER — QUETIAPINE FUMARATE 100 MG PO TABS
100.0000 mg | ORAL_TABLET | Freq: Every day | ORAL | 1 refills | Status: DC
Start: 2022-05-01 — End: 2022-08-29

## 2022-05-01 NOTE — Progress Notes (Signed)
BH MD/PA/NP OP Progress Note  05/01/2022 3:54 PM Lydia Vasquez  MRN:  [id number]  Chief Complaint:  Chief Complaint  Patient presents with   Follow-up   Anxiety   Depression   HPI:  Lydia Vasquez is a 62 yr old female who presents for Follow Up and Medication Management.  PPHx is significant for MDD, Recurrent, Anxiety, PTSD and Insomnia, and no history of Suicide Attempts, Self Injurious Behavior, or Psychiatric Hospitalizations.    She reports that she has not noticed any significant changes since her last appointment.  She reports that she did start the Lexapro and increase her BuSpar but that she did not notice any reduction of her anxiety.  Discussed with her that since she has not noticed much of an improvement we would discontinue them and start at a different medication.  Discussed doing a 5-day taper of both of them before stopping.  Discussed at that point she would start Effexor.  Discussed potential risks and side effects specifically that Effexor can sometimes increase anxiety and she was agreeable to the trial.  When asked if she has reestablished with therapy she reports she has an appointment in 2 weeks.  Discussed we would not make any other changes to her medications at this time.  She reports that she does still have the occasional thought of SI with no plan/intent but that she does continue to immediately push back against those thoughts with thoughts of her grandson and family.  She reports no SI, HI, or AVH.  She reports her sleep is good.  She reports her appetite is fair.  She reports no other concerns at present.  She will return for follow-up in approximately 6 weeks.  Discussed with patient that Resident Provider would be transitioning their care to another Resident Provider starting July 2024.  She reported understanding and had no concerns.   Visit Diagnosis:    ICD-10-CM   1. GAD (generalized anxiety disorder)  F41.1 escitalopram (LEXAPRO) 10 MG tablet    busPIRone  (BUSPAR) 10 MG tablet    venlafaxine XR (EFFEXOR XR) 37.5 MG 24 hr capsule    2. PTSD (post-traumatic stress disorder)  F43.10 escitalopram (LEXAPRO) 10 MG tablet    mirtazapine (REMERON) 45 MG tablet    QUEtiapine (SEROQUEL) 100 MG tablet    venlafaxine XR (EFFEXOR XR) 37.5 MG 24 hr capsule    3. Major depression, recurrent, chronic  F33.9 escitalopram (LEXAPRO) 10 MG tablet    mirtazapine (REMERON) 45 MG tablet    QUEtiapine (SEROQUEL) 100 MG tablet    venlafaxine XR (EFFEXOR XR) 37.5 MG 24 hr capsule      Past Psychiatric History: MDD, Recurrent, Anxiety, PTSD and Insomnia, and no history of Suicide Attempts, Self Injurious Behavior, or Psychiatric Hospitalizations.    Past Medical History:  Past Medical History:  Diagnosis Date   Anxiety    Depression    Hyperlipidemia    Hypertension    Ventricular tachycardia (HCC) 06/11/2020    Past Surgical History:  Procedure Laterality Date   ABDOMINAL HYSTERECTOMY     CESAREAN SECTION      Family Psychiatric History: Maternal Cousin- Schizophrenia or Bipolar Disorder No known Substance Abuse or Suicides.  Family History:  Family History  Problem Relation Age of Onset   Stroke Mother    Dementia Mother     Social History:  Social History   Socioeconomic History   Marital status: Single    Spouse name: Not on file   Number of  children: Not on file   Years of education: Not on file   Highest education level: Not on file  Occupational History   Not on file  Tobacco Use   Smoking status: Former    Types: Cigarettes   Smokeless tobacco: Never  Vaping Use   Vaping Use: Never used  Substance and Sexual Activity   Alcohol use: Not Currently   Drug use: Never   Sexual activity: Not on file  Other Topics Concern   Not on file  Social History Narrative   Not on file   Social Determinants of Health   Financial Resource Strain: Low Risk  (02/12/2021)   Overall Financial Resource Strain (CARDIA)    Difficulty of  Paying Living Expenses: Not hard at all  Food Insecurity: Food Insecurity Present (02/12/2021)   Hunger Vital Sign    Worried About Running Out of Food in the Last Year: Sometimes true    Ran Out of Food in the Last Year: Sometimes true  Transportation Needs: Unmet Transportation Needs (02/12/2021)   PRAPARE - Administrator, Civil ServiceTransportation    Lack of Transportation (Medical): Yes    Lack of Transportation (Non-Medical): Yes  Physical Activity: Inactive (02/12/2021)   Exercise Vital Sign    Days of Exercise per Week: 0 days    Minutes of Exercise per Session: 0 min  Stress: Stress Concern Present (02/12/2021)   [employer] - Occupational Stress Questionnaire    Feeling of Stress : Very much  Social Connections: Socially Isolated (02/12/2021)   Social Connection and Isolation Panel [NHANES]    Frequency of Communication with Friends and Family: Once a week    Frequency of Social Gatherings with Friends and Family: More than three times a week    Attends Religious Services: Never    Database administratorActive Member of Clubs or Organizations: No    Attends Engineer, structuralClub or Organization Meetings: Never    Marital Status: Never married    Allergies: No Known Allergies  Metabolic Disorder Labs: No results found for: "HGBA1C", "MPG" No results found for: "PROLACTIN" Lab Results  Component Value Date   CHOL 192 10/26/2019   TRIG 332 (H) 10/26/2019   HDL <10 (L) 10/26/2019   CHOLHDL NOT CALCULATED 10/26/2019   VLDL 66 (H) 10/26/2019   LDLCALC NOT CALCULATED 10/26/2019   No results found for: "TSH"  Therapeutic Level Labs: No results found for: "LITHIUM" No results found for: "VALPROATE" No results found for: "CBMZ"  Current Medications: Current Outpatient Medications  Medication Sig Dispense Refill   [START ON 05/08/2022] venlafaxine XR (EFFEXOR XR) 37.5 MG 24 hr capsule Take 1 capsule (37.5 mg total) by mouth daily. 30 capsule 1   B Complex-C-Folic Acid (B COMPLEX-VITAMIN C-FOLIC ACID) 1 MG  tablet Take 1 tablet by mouth daily with breakfast. 30 tablet 2   [START ON 05/02/2022] busPIRone (BUSPAR) 10 MG tablet Take 0.5 tablets (5 mg total) by mouth 2 (two) times daily for 5 days.     [START ON 05/02/2022] escitalopram (LEXAPRO) 10 MG tablet Take 0.5 tablets (5 mg total) by mouth daily for 5 days.     famotidine (PEPCID) 20 MG tablet TAKE 1 TABLET(20 MG) BY MOUTH TWICE DAILY 60 tablet 0   metoprolol tartrate (LOPRESSOR) 25 MG tablet TAKE 1 TABLET(25 MG) BY MOUTH TWICE DAILY 180 tablet 0   mirtazapine (REMERON) 45 MG tablet Take 1 tablet (45 mg total) by mouth at bedtime. 30 tablet 1   QUEtiapine (SEROQUEL) 100 MG tablet Take 1 tablet (  100 mg total) by mouth at bedtime. 30 tablet 1   No current facility-administered medications for this visit.     Musculoskeletal: Strength & Muscle Tone: within normal limits Gait & Station: normal Patient leans: N/A  Psychiatric Specialty Exam: Review of Systems  Respiratory:  Negative for shortness of breath.   Cardiovascular:  Negative for chest pain.  Gastrointestinal:  Negative for abdominal pain, constipation, diarrhea, nausea and vomiting.  Neurological:  Negative for dizziness, weakness and headaches.  Psychiatric/Behavioral:  Positive for dysphoric mood. Negative for hallucinations, sleep disturbance and suicidal ideas. The patient is nervous/anxious.     Blood pressure 137/86, pulse 68, height 5' 10.5" (1.791 m), weight 298 lb 9.6 oz (135.4 kg), SpO2 94 %.Body mass index is 42.24 kg/m.  General Appearance: Casual and Fairly Groomed  Eye Contact:  Good  Speech:  Clear and Coherent and Normal Rate  Volume:  Normal  Mood:  Anxious  Affect:  Congruent  Thought Process:  Coherent and Goal Directed  Orientation:  Full (Time, Place, and Person)  Thought Content: WDL and Logical   Suicidal Thoughts:  No  Homicidal Thoughts:  No  Memory:  Immediate;   Good Recent;   Good  Judgement:  Good  Insight:  Good  Psychomotor Activity:   Normal  Concentration:  Concentration: Good and Attention Span: Good  Recall:  Good  Fund of Knowledge: Good  Language: Good  Akathisia:  Negative  Handed:  Right  AIMS (if indicated): not done  Assets:  Communication Skills Desire for Improvement Housing Leisure Time Resilience Social Support  ADL's:  Intact  Cognition: WNL  Sleep:  Good   Screenings: GAD-7    Advertising copywriter from 10/09/2021 in [facility] Counselor from 09/10/2021 in [facility] Counselor from 06/19/2021 in [facility] Counselor from 04/10/2021 in [facility] Office Visit from 04/08/2021 in [facility]  Total GAD-7 Score 18 14 19 21 20       PHQ2-9    Flowsheet Row Counselor from 10/09/2021 in [facility] Counselor from 09/10/2021 in [facility] Counselor from 06/19/2021 in [facility] Counselor from 04/10/2021 in [facility] Office Visit from 04/08/2021 in [facility]  PHQ-2 Total Score 3 6 3 6 6   PHQ-9 Total Score 17 19 20 23 23       Flowsheet Row Counselor from 10/09/2021 in [facility] Counselor from 09/10/2021 in [facility] Counselor from 06/19/2021 in [facility]  C-SSRS RISK CATEGORY Low Risk Low Risk Moderate Risk        Assessment and Plan:  Lydia Vasquez is a 62 yr old female who presents for Follow Up and Medication Management.  PPHx is significant for MDD, Recurrent, Anxiety, PTSD and Insomnia, and no history of Suicide Attempts, Self Injurious Behavior, or Psychiatric Hospitalizations.     Krimson continues to have significant anxiety and has not noticed any improvement with the Lexapro or the increase in BuSpar.   Due to this we will taper off both of them over 5 days before stopping after that she will start Effexor.  We will not make any other changes to her medications at this time.  She will be restarting therapy.  She will return for follow-up in approximately 6 weeks.   MDD, Recurrent, Chronic  PTSD  GAD  Insomnia : -Continue Remeron 45 mg QHS for depression, anxiety, and sleep. 30 tablets with 1 refill. -Decrease Lexapro to 5 mg daily for 5 days then stop -Continue Seroquel 100 mg QHS for depression, anxiety, and sleep. 30 tablets with 1 refill. -Decrease Buspar to 5 mg BID for 5 days then stop. -In 6 days start Effexor XR 37.5 mg daily for depression and anxiety.  30 tablets with 1 refill.    Collaboration of Care: Collaboration of Care: Other provider involved in patient's care [facility] Therapist  Patient/Guardian was advised Release of Information must be obtained prior to any record release in order to collaborate their care with an outside provider. Patient/Guardian was advised if they have not already done so to contact the registration department to sign all necessary forms in order for [place] to release information regarding their care.   Consent: Patient/Guardian gives verbal consent for treatment and assignment of benefits for services provided during this visit. Patient/Guardian expressed understanding and agreed to proceed.    Lauro Franklin, MD 05/01/2022, 3:54 PM

## 2022-05-13 ENCOUNTER — Ambulatory Visit (HOSPITAL_COMMUNITY): Payer: Medicaid Other | Admitting: Licensed Clinical Social Worker

## 2022-06-05 ENCOUNTER — Encounter (HOSPITAL_COMMUNITY): Payer: Medicaid Other | Admitting: Student in an Organized Health Care Education/Training Program

## 2022-06-18 ENCOUNTER — Encounter: Payer: Self-pay | Admitting: Critical Care Medicine

## 2022-06-18 ENCOUNTER — Ambulatory Visit: Payer: Medicaid Other | Attending: Critical Care Medicine | Admitting: Critical Care Medicine

## 2022-06-18 VITALS — BP 145/94 | HR 79 | Temp 97.8°F | Ht 70.0 in | Wt 300.0 lb

## 2022-06-18 DIAGNOSIS — R300 Dysuria: Secondary | ICD-10-CM

## 2022-06-18 DIAGNOSIS — Z124 Encounter for screening for malignant neoplasm of cervix: Secondary | ICD-10-CM | POA: Diagnosis not present

## 2022-06-18 DIAGNOSIS — Z1211 Encounter for screening for malignant neoplasm of colon: Secondary | ICD-10-CM | POA: Diagnosis not present

## 2022-06-18 DIAGNOSIS — I10 Essential (primary) hypertension: Secondary | ICD-10-CM

## 2022-06-18 DIAGNOSIS — F339 Major depressive disorder, recurrent, unspecified: Secondary | ICD-10-CM

## 2022-06-18 DIAGNOSIS — R2232 Localized swelling, mass and lump, left upper limb: Secondary | ICD-10-CM

## 2022-06-18 DIAGNOSIS — Z6841 Body Mass Index (BMI) 40.0 and over, adult: Secondary | ICD-10-CM

## 2022-06-18 DIAGNOSIS — Z8541 Personal history of malignant neoplasm of cervix uteri: Secondary | ICD-10-CM

## 2022-06-18 DIAGNOSIS — K7030 Alcoholic cirrhosis of liver without ascites: Secondary | ICD-10-CM

## 2022-06-18 DIAGNOSIS — G621 Alcoholic polyneuropathy: Secondary | ICD-10-CM

## 2022-06-18 DIAGNOSIS — R002 Palpitations: Secondary | ICD-10-CM

## 2022-06-18 MED ORDER — NEPHRO-VITE RX 1 MG PO TABS: 1.0000 | ORAL_TABLET | Freq: Every day | ORAL | 2 refills | Status: DC

## 2022-06-18 MED ORDER — AMLODIPINE BESYLATE 5 MG PO TABS
5.0000 mg | ORAL_TABLET | Freq: Every day | ORAL | 2 refills | Status: DC
Start: 2022-06-18 — End: 2023-03-26

## 2022-06-18 MED ORDER — CARVEDILOL 12.5 MG PO TABS
12.5000 mg | ORAL_TABLET | Freq: Two times a day (BID) | ORAL | 3 refills | Status: DC
Start: 2022-06-18 — End: 2023-03-26

## 2022-06-18 MED ORDER — FAMOTIDINE 20 MG PO TABS: ORAL_TABLET | ORAL | 4 refills | Status: DC

## 2022-06-18 NOTE — Patient Instructions (Signed)
Labs will be obtained  Referral to gynecology gastroenterology will be obtained  Ultrasound of left underarm and mammogram will be obtained  Complete screening labs and urine studies will be obtained  Start carvedilol twice daily and amlodipine once daily for blood pressure and stop metoprolol  Return to Dr. Delford Field 2 months
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
## 2022-06-19 ENCOUNTER — Encounter: Payer: Self-pay | Admitting: Critical Care Medicine

## 2022-06-19 DIAGNOSIS — G621 Alcoholic polyneuropathy: Secondary | ICD-10-CM | POA: Insufficient documentation

## 2022-06-19 DIAGNOSIS — I10 Essential (primary) hypertension: Secondary | ICD-10-CM | POA: Insufficient documentation

## 2022-06-19 DIAGNOSIS — R2232 Localized swelling, mass and lump, left upper limb: Secondary | ICD-10-CM | POA: Insufficient documentation

## 2022-06-19 DIAGNOSIS — Z1211 Encounter for screening for malignant neoplasm of colon: Secondary | ICD-10-CM | POA: Insufficient documentation

## 2022-06-19 DIAGNOSIS — R300 Dysuria: Secondary | ICD-10-CM | POA: Insufficient documentation

## 2022-06-19 DIAGNOSIS — R7303 Prediabetes: Secondary | ICD-10-CM | POA: Insufficient documentation

## 2022-06-19 LAB — COMPREHENSIVE METABOLIC PANEL
ALT: 12 IU/L (ref 0–32)
AST: 21 IU/L (ref 0–40)
Albumin/Globulin Ratio: 1.1 — ABNORMAL LOW (ref 1.2–2.2)
Albumin: 3.9 g/dL (ref 3.8–4.9)
Alkaline Phosphatase: 188 IU/L — ABNORMAL HIGH (ref 44–121)
BUN/Creatinine Ratio: 13 (ref 12–28)
BUN: 9 mg/dL (ref 8–27)
Bilirubin Total: 1 mg/dL (ref 0.0–1.2)
CO2: 24 mmol/L (ref 20–29)
Calcium: 9.8 mg/dL (ref 8.7–10.3)
Chloride: 101 mmol/L (ref 96–106)
Creatinine, Ser: 0.71 mg/dL (ref 0.57–1.00)
Globulin, Total: 3.7 g/dL (ref 1.5–4.5)
Glucose: 97 mg/dL (ref 70–99)
Potassium: 3.7 mmol/L (ref 3.5–5.2)
Sodium: 140 mmol/L (ref 134–144)
Total Protein: 7.6 g/dL (ref 6.0–8.5)
eGFR: 97 mL/min/{1.73_m2} (ref >59)

## 2022-06-19 LAB — CBC WITH DIFFERENTIAL/PLATELET
Basophils Absolute: 0 10*3/uL (ref 0.0–0.2)
Basos: 1 %
EOS (ABSOLUTE): 0.1 10*3/uL (ref 0.0–0.4)
Eos: 1 %
Hematocrit: 43 % (ref 34.0–46.6)
Hemoglobin: 13.3 g/dL (ref 11.1–15.9)
Immature Grans (Abs): 0 10*3/uL (ref 0.0–0.1)
Immature Granulocytes: 0 %
Lymphocytes Absolute: 2.4 10*3/uL (ref 0.7–3.1)
Lymphs: 37 %
MCH: 25.6 pg — ABNORMAL LOW (ref 26.6–33.0)
MCHC: 30.9 g/dL — ABNORMAL LOW (ref 31.5–35.7)
MCV: 83 fL (ref 79–97)
Monocytes Absolute: 0.4 10*3/uL (ref 0.1–0.9)
Monocytes: 6 %
Neutrophils Absolute: 3.6 10*3/uL (ref 1.4–7.0)
Neutrophils: 55 %
Platelets: 323 10*3/uL (ref 150–450)
RBC: 5.2 x10E6/uL (ref 3.77–5.28)
RDW: 14.8 % (ref 11.7–15.4)
WBC: 6.5 10*3/uL (ref 3.4–10.8)

## 2022-06-19 LAB — URINALYSIS
Bilirubin, UA: NEGATIVE
Glucose, UA: NEGATIVE
Nitrite, UA: NEGATIVE
Specific Gravity, UA: 1.021 (ref 1.005–1.030)
Urobilinogen, Ur: 1 mg/dL (ref 0.2–1.0)
pH, UA: 6.5 (ref 5.0–7.5)

## 2022-06-19 LAB — HEMOGLOBIN A1C
Est. average glucose Bld gHb Est-mCnc: 126 mg/dL
Hgb A1c MFr Bld: 6 % — ABNORMAL HIGH (ref 4.8–5.6)

## 2022-06-19 NOTE — Assessment & Plan Note (Signed)
For health screening will include an A1c to screen for diabetes

## 2022-06-19 NOTE — Assessment & Plan Note (Signed)
No evidence of mass on physical exam but will send for an ultrasound of the left axilla and repeat mammogram

## 2022-06-19 NOTE — Assessment & Plan Note (Signed)
Referral made for colon cancer screening with colonoscopy

## 2022-06-19 NOTE — Assessment & Plan Note (Signed)
Has been improved with metoprolol but with hypertension will change to carvedilol 12.5 mg twice daily

## 2022-06-19 NOTE — Assessment & Plan Note (Signed)
Newly diagnosed with hypertension begin carvedilol 12.5 mg twice daily and begin amlodipine 5 mg daily and bring patient back for short-term follow-up check metabolic panel

## 2022-06-19 NOTE — Assessment & Plan Note (Signed)
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

## 2022-06-19 NOTE — Assessment & Plan Note (Signed)
Currently having dysuria we will check urinalysis and urine culture

## 2022-06-19 NOTE — Progress Notes (Signed)
Let pt know likely has uti waiting on culture Liver kidney normal no diabetes but she does have prediabetes follow healthy plant based diet and no meds needed

## 2022-06-19 NOTE — Assessment & Plan Note (Signed)
Currently no longer drinking alcohol stable at this time

## 2022-06-19 NOTE — Assessment & Plan Note (Addendum)
Continues with significant depression symptoms The patient scores high with the PHQ-9 including thoughts of suicide but no plan  Patient has follow-up with her mental health provider and is seen frequently by her behavioral therapist.  Medications recently been adjusted she is now on Effexor and Seroquel and Remeron

## 2022-06-19 NOTE — Assessment & Plan Note (Signed)
Referral to gynecology for repeat Pap smear

## 2022-06-20 ENCOUNTER — Telehealth: Payer: Self-pay | Admitting: *Deleted

## 2022-06-20 ENCOUNTER — Other Ambulatory Visit: Payer: Self-pay | Admitting: Pharmacist

## 2022-06-20 ENCOUNTER — Telehealth: Payer: Self-pay

## 2022-06-20 MED ORDER — SULFAMETHOXAZOLE-TRIMETHOPRIM 800-160 MG PO TABS: 1.0000 | ORAL_TABLET | Freq: Two times a day (BID) | ORAL | 0 refills | Status: DC

## 2022-06-20 NOTE — Telephone Encounter (Signed)
Patient already made aware

## 2022-06-20 NOTE — Telephone Encounter (Signed)
Patient called for lab results and notified:  Let pt know likely has uti waiting on culture Liver kidney normal no diabetes but she does have prediabetes follow healthy plant based diet and no meds needed Let pt know she has uti with e coli. A prescription for antibiotics will be ordered later today. Ask if she has a private preferred pharmacy as or pharmacy will be closed before i can send rx   Patient notified  and she states [facility] is fine.

## 2022-06-20 NOTE — Telephone Encounter (Signed)
-----   Message from Storm Frisk, MD sent at 06/20/2022  9:34 AM EDT ----- [facility] uses [facility] so rx will be sent   there

## 2022-06-20 NOTE — Telephone Encounter (Signed)
-----   Message from Storm Frisk, MD sent at 06/19/2022  6:33 AM EDT ----- Let pt know likely has uti waiting on culture Liver kidney normal no diabetes but she does have prediabetes follow healthy plant based diet and no meds needed

## 2022-06-20 NOTE — Telephone Encounter (Signed)
Noted  

## 2022-06-20 NOTE — Telephone Encounter (Signed)
-----   Message from Storm Frisk, MD sent at 06/20/2022  9:33 AM EDT ----- Let pt know she has uti with e coli. A prescription for antibiotics will be ordered later today. Ask if she has a private preferred pharmacy as or pharmacy will be closed before i can send rx

## 2022-06-20 NOTE — Telephone Encounter (Signed)
Called and spoke to patient and patient's PCP, Dr. Delford Field.   Urine positive for E coli cystitis. Per PCP will send DS Bactrim 1 tablet BID, #10. Rxn sent to patient's preferred [facility]. Called patient and informed her of this. She will pick-up and start therapy today.

## 2022-06-20 NOTE — Telephone Encounter (Signed)
-----   Message from Patrick E Wright, MD sent at 06/20/2022  9:34 AM EDT ----- [facility] uses [facility] so rx will be sent   there 

## 2022-06-20 NOTE — Telephone Encounter (Signed)
Error

## 2022-06-22 LAB — URINE CULTURE

## 2022-06-23 ENCOUNTER — Other Ambulatory Visit: Payer: Self-pay | Admitting: Critical Care Medicine

## 2022-06-23 LAB — URINE CULTURE

## 2022-06-23 MED ORDER — CEFDINIR 300 MG PO CAPS: 300.0000 mg | ORAL_CAPSULE | Freq: Two times a day (BID) | ORAL | 0 refills | Status: DC

## 2022-06-24 ENCOUNTER — Ambulatory Visit (HOSPITAL_COMMUNITY): Payer: Medicaid Other | Admitting: Licensed Clinical Social Worker

## 2022-07-02 ENCOUNTER — Ambulatory Visit: Payer: Self-pay | Admitting: *Deleted

## 2022-07-02 ENCOUNTER — Telehealth: Payer: Self-pay | Admitting: Critical Care Medicine

## 2022-07-02 DIAGNOSIS — G8929 Other chronic pain: Secondary | ICD-10-CM

## 2022-07-02 NOTE — Telephone Encounter (Signed)
  Chief Complaint: bilateral knee pain severe Symptoms: decrease balance with walking, severe knee pain , ongoing numbness in feet and legs.  Frequency: on going" Pertinent Negatives: Patient denies swelling no redness reported  Disposition: [] ED /[] Urgent Care (no appt availability in office) / [] Appointment(In office/virtual)/ []  [facility]/ [] Home Care/ [x] Refused Recommended Disposition /[] [place] [facility] [facility]/ []  Follow-up with PCP Additional Notes:   Recommended mobile bus. No available appt until July. Patient reports she will just have to go to ED. Patient requesting referral for pain in knees     Reason for Disposition  [1] SEVERE pain (e.g., excruciating, unable to walk) AND [2] not improved after 2 hours of pain medicine  Answer Assessment - Initial Assessment Questions 1. LOCATION and RADIATION: "Where is the pain located?"      Bilateral knees 2. QUALITY: "What does the pain feel like?"  (e.g., sharp, dull, aching, burning)     Aching pain with getting up on feet  3. SEVERITY: "How bad is the pain?" "What does it keep you from doing?"   (Scale 1-10; or mild, moderate, severe)   -  MILD (1-3): doesn't interfere with normal activities    -  MODERATE (4-7): interferes with normal activities (e.g., work or school) or awakens from sleep, limping    -  SEVERE (8-10): excruciating pain, unable to do any normal activities, unable to walk     Moderate to severe. Can walk but balance off and severe pain  4. ONSET: "When did the pain start?" "Does it come and go, or is it there all the time?"     On going and now worse 5. RECURRENT: "Have you had this pain before?" If Yes, ask: "When, and what happened then?"     Yes  6. SETTING: "Has there been any recent work, exercise or other activity that involved that part of the body?"      No  7. AGGRAVATING FACTORS: "What makes the knee pain worse?" (e.g., walking, climbing stairs, running)     Walking standing sitting  8.  ASSOCIATED SYMPTOMS: "Is there any swelling or redness of the knee?"     Denies  9. OTHER SYMPTOMS: "Do you have any other symptoms?" (e.g., chest pain, difficulty breathing, fever, calf pain)     Numbness from feet to legs ongoing and knee pain worsening.  10. PREGNANCY: "Is there any chance you are pregnant?" "When was your last menstrual period?"       na  Protocols used: Knee Pain-A-AH

## 2022-07-02 NOTE — Telephone Encounter (Signed)
Referral Request - Has patient seen PCP for this complaint? yes *If NO, is insurance requiring patient see PCP for this issue before PCP can refer them? Referral for which specialty: for pain in knees Preferred provider/office: ? Reason for referral: pain in knees

## 2022-07-03 NOTE — Telephone Encounter (Signed)
Spoke with patient . Verified name & DOB   Patient made aware that referral has been placed and someone should be contacting her with the appointment date and time.

## 2022-07-03 NOTE — Telephone Encounter (Signed)
Noted  

## 2022-07-03 NOTE — Telephone Encounter (Signed)
Referral for knee pain made to orthopedics

## 2022-07-15 ENCOUNTER — Ambulatory Visit: Payer: Medicaid Other | Admitting: Orthopaedic Surgery

## 2022-07-17 ENCOUNTER — Encounter (HOSPITAL_COMMUNITY): Payer: Medicaid Other | Admitting: Student in an Organized Health Care Education/Training Program

## 2022-07-17 NOTE — Progress Notes (Deleted)
BH MD/PA/NP OP Progress Note  07/17/2022 9:41 AM Lydia Vasquez  MRN:  [id number]  Chief Complaint:  No chief complaint on file.  HPI:  Lydia Vasquez is a 62 yr old female who presents for Follow Up and Medication Management.  PPHx is significant for MDD, Recurrent, Anxiety, PTSD and Insomnia, and no history of Suicide Attempts, Self Injurious Behavior, or Psychiatric Hospitalizations.    She ***  Discussed with patient that Resident Provider would be transitioning their care to another Resident Provider, Dr. Jerrel Ivory, starting July 2024.  She reported understanding and had no concerns.   Visit Diagnosis:  No diagnosis found.   Past Psychiatric History: MDD, Recurrent, Anxiety, PTSD and Insomnia, and no history of Suicide Attempts, Self Injurious Behavior, or Psychiatric Hospitalizations.    Past Medical History:  Past Medical History:  Diagnosis Date   Anxiety    Depression    Hyperlipidemia    Hypertension    Ventricular tachycardia (HCC) 06/11/2020    Past Surgical History:  Procedure Laterality Date   ABDOMINAL HYSTERECTOMY     CESAREAN SECTION      Family Psychiatric History: Maternal Cousin- Schizophrenia or Bipolar Disorder No known Substance Abuse or Suicides.  Family History:  Family History  Problem Relation Age of Onset   Stroke Mother    Dementia Mother     Social History:  Social History   Socioeconomic History   Marital status: Single    Spouse name: Not on file   Number of children: Not on file   Years of education: Not on file   Highest education level: Not on file  Occupational History   Not on file  Tobacco Use   Smoking status: Former    Types: Cigarettes   Smokeless tobacco: Never  Vaping Use   Vaping Use: Never used  Substance and Sexual Activity   Alcohol use: Not Currently   Drug use: Never   Sexual activity: Not on file  Other Topics Concern   Not on file  Social History Narrative   Not on file   Social Determinants of  Health   Financial Resource Strain: Low Risk  (02/12/2021)   Overall Financial Resource Strain (CARDIA)    Difficulty of Paying Living Expenses: Not hard at all  Food Insecurity: Food Insecurity Present (02/12/2021)   Hunger Vital Sign    Worried About Running Out of Food in the Last Year: Sometimes true    Ran Out of Food in the Last Year: Sometimes true  Transportation Needs: Unmet Transportation Needs (02/12/2021)   PRAPARE - Transportation    Lack of Transportation (Medical): Yes    Lack of Transportation (Non-Medical): Yes  Physical Activity: Inactive (02/12/2021)   Exercise Vital Sign    Days of Exercise per Week: 0 days    Minutes of Exercise per Session: 0 min  Stress: Stress Concern Present (02/12/2021)   [employer] of Occupational Health - Occupational Stress Questionnaire    Feeling of Stress : Very much  Social Connections: Socially Isolated (02/12/2021)   Social Connection and Isolation Panel [NHANES]    Frequency of Communication with Friends and Family: Once a week    Frequency of Social Gatherings with Friends and Family: More than three times a week    Attends Religious Services: Never    Database administrator or Organizations: No    Attends Banker Meetings: Never    Marital Status: Never married    Allergies: No Known Allergies  Metabolic Disorder Labs:  Lab Results  Component Value Date   HGBA1C 6.0 (H) 06/18/2022   No results found for: "PROLACTIN" Lab Results  Component Value Date   CHOL 192 10/26/2019   TRIG 332 (H) 10/26/2019   HDL <10 (L) 10/26/2019   CHOLHDL NOT CALCULATED 10/26/2019   VLDL 66 (H) 10/26/2019   LDLCALC NOT CALCULATED 10/26/2019   No results found for: "TSH"  Therapeutic Level Labs: No results found for: "LITHIUM" No results found for: "VALPROATE" No results found for: "CBMZ"  Current Medications: Current Outpatient Medications  Medication Sig Dispense Refill   amLODipine (NORVASC) 5 MG tablet Take 1  tablet (5 mg total) by mouth daily. 90 tablet 2   B Complex-C-Folic Acid (B COMPLEX-VITAMIN C-FOLIC ACID) 1 MG tablet Take 1 tablet by mouth daily with breakfast. 60 tablet 2   carvedilol (COREG) 12.5 MG tablet Take 1 tablet (12.5 mg total) by mouth 2 (two) times daily with a meal. 60 tablet 3   cefdinir (OMNICEF) 300 MG capsule Take 1 capsule (300 mg total) by mouth 2 (two) times daily. 10 capsule 0   escitalopram (LEXAPRO) 10 MG tablet Take 0.5 tablets (5 mg total) by mouth daily for 5 days.     famotidine (PEPCID) 20 MG tablet TAKE 1 TABLET(20 MG) BY MOUTH TWICE DAILY 120 tablet 4   mirtazapine (REMERON) 45 MG tablet Take 1 tablet (45 mg total) by mouth at bedtime. 30 tablet 1   QUEtiapine (SEROQUEL) 100 MG tablet Take 1 tablet (100 mg total) by mouth at bedtime. 30 tablet 1   venlafaxine XR (EFFEXOR XR) 37.5 MG 24 hr capsule Take 1 capsule (37.5 mg total) by mouth daily. 30 capsule 1   No current facility-administered medications for this visit.     Musculoskeletal: Strength & Muscle Tone: within normal limits Gait & Station: normal Patient leans: N/A *** Psychiatric Specialty Exam: Review of Systems  There were no vitals taken for this visit.There is no height or weight on file to calculate BMI.  General Appearance: Casual and Fairly Groomed  Eye Contact:  Good  Speech:  Clear and Coherent and Normal Rate  Volume:  Normal  Mood:  Anxious  Affect:  Congruent  Thought Process:  Coherent and Goal Directed  Orientation:  Full (Time, Place, and Person)  Thought Content: WDL and Logical   Suicidal Thoughts:  No  Homicidal Thoughts:  No  Memory:  Immediate;   Good Recent;   Good  Judgement:  Good  Insight:  Good  Psychomotor Activity:  Normal  Concentration:  Concentration: Good and Attention Span: Good  Recall:  Good  Fund of Knowledge: Good  Language: Good  Akathisia:  Negative  Handed:  Right  AIMS (if indicated): not done  Assets:  Communication Skills Desire for  Improvement Housing Leisure Time Resilience Social Support  ADL's:  Intact  Cognition: WNL  Sleep:  Good   Screenings: GAD-7    Garment/textile technologist Visit from 06/18/2022 in [facility] Counselor from 10/09/2021 in [facility] Counselor from 09/10/2021 in [facility] Counselor from 06/19/2021 in [facility] Counselor from 04/10/2021 in [facility]  Total GAD-7 Score 21 18 14 19 21       PHQ2-9    Flowsheet Row Office Visit from 06/18/2022 in [facility] Counselor from 10/09/2021 in [facility] Counselor from 09/10/2021 in [facility] Counselor from 06/19/2021 in [facility] Counselor from 04/10/2021 in [facility]  PHQ-2 Total Score 2 3 6 3 6   PHQ-9 Total Score 18 17 19 20 23       Flowsheet Row Counselor from 10/09/2021 in [facility] Counselor from 09/10/2021 in [facility] Counselor from 06/19/2021 in [facility]  C-SSRS RISK CATEGORY Low Risk Low Risk Moderate Risk        Assessment and Plan:  Lydia Vasquez is a 62 yr old female who presents for Follow Up and Medication Management.  PPHx is significant for MDD, Recurrent, Anxiety, PTSD and Insomnia, and no history of Suicide Attempts, Self Injurious Behavior, or Psychiatric Hospitalizations.     Lydia Vasquez ***   MDD, Recurrent, Chronic  PTSD  GAD  Insomnia : -Continue Remeron 45 mg QHS for depression, anxiety, and sleep. 30 tablets with 1 refill. -Decrease Lexapro to 5 mg daily for 5 days then stop -Continue Seroquel 100 mg QHS for depression, anxiety, and sleep. 30 tablets with 1 refill. -Decrease Buspar to 5 mg BID for 5 days then stop. -In 6 days  start Effexor XR 37.5 mg daily for depression and anxiety.  30 tablets with 1 refill.    Collaboration of Care: Collaboration of Care: Other provider involved in patient's care [facility] Therapist  Patient/Guardian was advised Release of Information must be obtained prior to any record release in order to collaborate their care with an outside provider. Patient/Guardian was advised if they have not already done so to contact the registration department to sign all necessary forms in order for [place] to release information regarding their care.   Consent: Patient/Guardian gives verbal consent for treatment and assignment of benefits for services provided during this visit. Patient/Guardian expressed understanding and agreed to proceed.    Lauro Franklin, MD 07/17/2022, 9:41 AM

## 2022-07-25 ENCOUNTER — Other Ambulatory Visit (INDEPENDENT_AMBULATORY_CARE_PROVIDER_SITE_OTHER): Payer: Medicaid Other

## 2022-07-25 ENCOUNTER — Ambulatory Visit (INDEPENDENT_AMBULATORY_CARE_PROVIDER_SITE_OTHER): Payer: Medicaid Other | Admitting: Orthopaedic Surgery

## 2022-07-25 DIAGNOSIS — M25561 Pain in right knee: Secondary | ICD-10-CM

## 2022-07-25 DIAGNOSIS — Z6841 Body Mass Index (BMI) 40.0 and over, adult: Secondary | ICD-10-CM

## 2022-07-25 DIAGNOSIS — M25562 Pain in left knee: Secondary | ICD-10-CM | POA: Diagnosis not present

## 2022-07-25 MED ORDER — METHYLPREDNISOLONE ACETATE 40 MG/ML IJ SUSP
40.00 mg | INTRAMUSCULAR | Status: AC | PRN
Start: 2022-07-25 — End: 2022-07-25
  Administered 2022-07-25: 40 mg via INTRA_ARTICULAR

## 2022-07-25 MED ORDER — BUPIVACAINE HCL 0.5 % IJ SOLN
2.0000 mL | INTRAMUSCULAR | Status: AC | PRN
Start: 2022-07-25 — End: 2022-07-25
  Administered 2022-07-25: 2 mL via INTRA_ARTICULAR

## 2022-07-25 MED ORDER — LIDOCAINE HCL 1 % IJ SOLN
2.00 mL | INTRAMUSCULAR | Status: AC | PRN
Start: 2022-07-25 — End: 2022-07-25
  Administered 2022-07-25: 2 mL

## 2022-07-25 NOTE — Progress Notes (Signed)
Office Visit Note   Patient: Lydia Vasquez           Date of Birth: 01/07/1961           MRN: [id number] Visit Date: 07/25/2022              Requested by: Storm Frisk, MD [address] [place],  [place] [address] PCP: Storm Frisk, MD   Assessment & Plan: Visit Diagnoses:  1. Acute pain of both knees   2. Body mass index 40.0-44.9, adult [facility])     Plan: Patient is a 62 year old female with bilateral knee pain probable osteoarthritis.  We talked about the importance of excess weight and quad strengthening and how this affects knee pain.  Joint spaces are preserved at this time.  Treatment options were discussed and we will administer bilateral cortisone injections today.  In the meantime she will work on weight loss and strengthening.  I will send her to physical therapy.  The patient meets the AMA guidelines for Morbid (severe) obesity with a BMI > 40.0 and I have recommended weight loss.  Follow-Up Instructions: No follow-ups on file.   Orders:  Orders Placed This Encounter  Procedures   Large Joint Inj: bilateral knee   XR KNEE 3 VIEW RIGHT   XR KNEE 3 VIEW LEFT   Ambulatory referral to Physical Therapy   No orders of the defined types were placed in this encounter.     Procedures: Large Joint Inj: bilateral knee on 07/25/2022 8:26 AM Indications: pain Details: 22 G needle  Arthrogram: No  Medications (Right): 2 mL lidocaine 1 %; 2 mL bupivacaine 0.5 %; 40 mg methylPREDNISolone acetate 40 MG/ML Medications (Left): 2 mL lidocaine 1 %; 2 mL bupivacaine 0.5 %; 40 mg methylPREDNISolone acetate 40 MG/ML Outcome: tolerated well, no immediate complications Patient was prepped and draped in the usual sterile fashion.       Clinical Data: No additional findings.   Subjective: Chief Complaint  Patient presents with   Right Knee - Pain   Left Knee - Pain    HPI Patient is a 62 year old female here for bilateral knee pain.  She has a lot of  pain with sit to stand transition and with using stairs.  She is finding a lot more difficulty with ADLs.  Denies any nighttime pain. Review of Systems  Constitutional: Negative.   HENT: Negative.    Eyes: Negative.   Respiratory: Negative.    Cardiovascular: Negative.   Endocrine: Negative.   Musculoskeletal: Negative.   Neurological: Negative.   Hematological: Negative.   Psychiatric/Behavioral: Negative.    All other systems reviewed and are negative.    Objective: Vital Signs: There were no vitals taken for this visit.  Physical Exam Vitals and nursing note reviewed.  Constitutional:      Appearance: She is well-developed.  HENT:     Head: Atraumatic.     Nose: Nose normal.  Eyes:     Extraocular Movements: Extraocular movements intact.  Cardiovascular:     Pulses: Normal pulses.  Pulmonary:     Effort: Pulmonary effort is normal.  Abdominal:     Palpations: Abdomen is soft.  Musculoskeletal:     Cervical back: Neck supple.  Skin:    General: Skin is warm.     Capillary Refill: Capillary refill takes less than 2 seconds.  Neurological:     Mental Status: She is alert. Mental status is at baseline.  Psychiatric:  Behavior: Behavior normal.        Thought Content: Thought content normal.        Judgment: Judgment normal.     Ortho Exam Examination of bilateral knees showed no joint effusion.  Pain and crepitus with range of motion.  Collaterals and cruciates are stable.  No joint line tenderness.  Tenderness along the medial and lateral retinaculum. Specialty Comments:  No specialty comments available.  Imaging: XR KNEE 3 VIEW LEFT  Result Date: 07/25/2022 Mild osteoarthritis with preserved joint spaces.  XR KNEE 3 VIEW RIGHT  Result Date: 07/25/2022 Mild osteoarthritis with preserved joint spaces.    PMFS History: Patient Active Problem List   Diagnosis Date Noted   Alcoholic peripheral neuropathy (HCC) 06/19/2022   HTN (hypertension)  06/19/2022   Dysuria 06/19/2022   Colon cancer screening 06/19/2022   Mass of left axilla 06/19/2022   Prediabetes 06/19/2022   PTSD (post-traumatic stress disorder) 02/12/2021   Palpitations 06/29/2020   Alcoholic cirrhosis of liver without ascites (HCC) 06/11/2020   Major depression, recurrent, chronic (HCC) 06/11/2020   History of alcohol abuse 06/11/2020   BMI 45.0-49.9, adult (HCC) 06/11/2020   History of cervical cancer 06/11/2020   Past Medical History:  Diagnosis Date   Anxiety    Depression    Hyperlipidemia    Hypertension    Ventricular tachycardia (HCC) 06/11/2020    Family History  Problem Relation Age of Onset   Stroke Mother    Dementia Mother     Past Surgical History:  Procedure Laterality Date   ABDOMINAL HYSTERECTOMY     CESAREAN SECTION     Social History   Occupational History   Not on file  Tobacco Use   Smoking status: Former    Types: Cigarettes   Smokeless tobacco: Never  Vaping Use   Vaping Use: Never used  Substance and Sexual Activity   Alcohol use: Not Currently   Drug use: Never   Sexual activity: Not on file

## 2022-07-29 ENCOUNTER — Ambulatory Visit (HOSPITAL_COMMUNITY): Payer: Medicaid Other | Admitting: Licensed Clinical Social Worker

## 2022-08-04 ENCOUNTER — Ambulatory Visit: Payer: Medicaid Other

## 2022-08-04 ENCOUNTER — Telehealth: Payer: Self-pay

## 2022-08-04 ENCOUNTER — Ambulatory Visit: Admission: RE | Admit: 2022-08-04 | Payer: Medicaid Other | Source: Ambulatory Visit

## 2022-08-04 ENCOUNTER — Other Ambulatory Visit: Payer: Self-pay | Admitting: Critical Care Medicine

## 2022-08-04 DIAGNOSIS — Z8541 Personal history of malignant neoplasm of cervix uteri: Secondary | ICD-10-CM

## 2022-08-04 DIAGNOSIS — R300 Dysuria: Secondary | ICD-10-CM

## 2022-08-04 DIAGNOSIS — Z1211 Encounter for screening for malignant neoplasm of colon: Secondary | ICD-10-CM

## 2022-08-04 DIAGNOSIS — G621 Alcoholic polyneuropathy: Secondary | ICD-10-CM

## 2022-08-04 DIAGNOSIS — K7030 Alcoholic cirrhosis of liver without ascites: Secondary | ICD-10-CM

## 2022-08-04 DIAGNOSIS — Z124 Encounter for screening for malignant neoplasm of cervix: Secondary | ICD-10-CM

## 2022-08-04 DIAGNOSIS — I10 Essential (primary) hypertension: Secondary | ICD-10-CM

## 2022-08-04 DIAGNOSIS — Z6841 Body Mass Index (BMI) 40.0 and over, adult: Secondary | ICD-10-CM

## 2022-08-04 DIAGNOSIS — F339 Major depressive disorder, recurrent, unspecified: Secondary | ICD-10-CM

## 2022-08-04 DIAGNOSIS — R2232 Localized swelling, mass and lump, left upper limb: Secondary | ICD-10-CM

## 2022-08-04 DIAGNOSIS — R002 Palpitations: Secondary | ICD-10-CM

## 2022-08-04 NOTE — Telephone Encounter (Signed)
-----   Message from Shan Levans sent at 08/04/2022 10:43 AM EDT ----- Let patient know mammogram normal. Recheck in one year

## 2022-08-04 NOTE — Therapy (Addendum)
OUTPATIENT PHYSICAL THERAPY LOWER EXTREMITY EVALUATION PHYSICAL THERAPY DISCHARGE SUMMARY  Visits from Start of Care: 1  Current functional level related to goals / functional outcomes: No re-assessment of goals    Remaining deficits: Status unknown   Education / Equipment: N/A   Patient agrees to discharge. Patient goals were not met. Patient is being discharged due to not returning since the last visit.   Patient Name: Phila Charles MRN: [id number] DOB:[birth year], 62 y.o., female Today's Date: 08/05/2022  END OF SESSION:  PT End of Session - 08/05/22 0836     Visit Number 1    Number of Visits 17    Date for PT Re-Evaluation 10/04/22    Authorization Type [employer] MCD    PT Start Time 0843    PT Stop Time 0921    PT Time Calculation (min) 38 min    Activity Tolerance Patient tolerated treatment well    Behavior During Therapy [facility] for tasks assessed/performed             Past Medical History:  Diagnosis Date   Anxiety    Depression    Hyperlipidemia    Hypertension    Ventricular tachycardia (HCC) 06/11/2020   Past Surgical History:  Procedure Laterality Date   ABDOMINAL HYSTERECTOMY     CESAREAN SECTION     Patient Active Problem List   Diagnosis Date Noted   Alcoholic peripheral neuropathy (HCC) 06/19/2022   HTN (hypertension) 06/19/2022   Dysuria 06/19/2022   Colon cancer screening 06/19/2022   Mass of left axilla 06/19/2022   Prediabetes 06/19/2022   PTSD (post-traumatic stress disorder) 02/12/2021   Palpitations 06/29/2020   Alcoholic cirrhosis of liver without ascites (HCC) 06/11/2020   Major depression, recurrent, chronic (HCC) 06/11/2020   History of alcohol abuse 06/11/2020   BMI 45.0-49.9, adult (HCC) 06/11/2020   History of cervical cancer 06/11/2020    PCP: Storm Frisk, MD  REFERRING PROVIDER: Gershon Mussel, MD  REFERRING DIAG:  [phone] (ICD-10-CM) - Acute pain of both knees  Z68.41 (ICD-10-CM) - Body mass index  40.0-44.9, adult (HCC)    THERAPY DIAG:  Chronic pain of both knees  Muscle weakness (generalized)  Rationale for Evaluation and Treatment: Rehabilitation  ONSET DATE: chronic   SUBJECTIVE:   SUBJECTIVE STATEMENT: Patient reports her knees have been bothering her for at least 8 years, but got better when she did PT before. She thinks her pain is from walking a lot when she lived in [place]. She reports the pain returned in 2022 and has progressively worsened since then. She received recent injections by Dr. Roda Shutters that have reduced her pain and made it easier for her to complete transfers and walk. She reports Dr. Roda Shutters said it would be helpful if she lost some weight. She has been working on her weight loss cutting down on fried foods and sweet foods.   PERTINENT HISTORY: Anxiety  Depression  Ventricular tachycardia  History of cervical cancer (per patient she is being monitored)  PAIN:  Are you having pain? Yes: NPRS scale: 7 currently; 10 at worst/10 Pain location: bilateral anterior knees Pain description: soreness Aggravating factors: sit to stand transfer,stairs, standing/walking > 5-10 minutes  Relieving factors: injections, rest   PRECAUTIONS: None   WEIGHT BEARING RESTRICTIONS: No  FALLS:  Has patient fallen in last 6 months? No  LIVING ENVIRONMENT: Lives with: lives with their daughter Lives in: House/apartment Stairs: No Has following equipment at home: None  OCCUPATION: disability   PLOF: Needs assistance  with homemaking  PATIENT GOALS: "to get better."    OBJECTIVE:   DIAGNOSTIC FINDINGS: Rt knee X-ray: Mild osteoarthritis with preserved joint spaces.  Lt knee X-ray: Mild osteoarthritis with preserved joint spaces.   PATIENT SURVEYS:  FOTO 27% function to 42% predicted   COGNITION: Overall cognitive status: Within functional limits for tasks assessed     SENSATION: WFL  EDEMA:  Unable to assess due to clothing   MUSCLE LENGTH: Hamstrings: WNL  bilaterally   POSTURE: genu recurvatum, pes planus   PALPATION: Diffuse tenderness about bilateral anterior knee   LOWER EXTREMITY ROM:  Active ROM Right eval Left eval  Hip flexion    Hip extension    Hip abduction    Hip adduction    Hip internal rotation    Hip external rotation    Knee flexion 95 pn 85 pn  Knee extension Full  Full  Ankle dorsiflexion 1 1  Ankle plantarflexion    Ankle inversion    Ankle eversion     (Blank rows = not tested)  LOWER EXTREMITY MMT:  MMT Right eval Left eval  Hip flexion 4 4  Hip extension 3+ 3+  Hip abduction 3+ 3+  Hip adduction    Hip internal rotation    Hip external rotation    Knee flexion 4+ 4  Knee extension 4+ 4  Ankle dorsiflexion    Ankle plantarflexion    Ankle inversion    Ankle eversion     (Blank rows = not tested)  LOWER EXTREMITY SPECIAL TESTS:  (+) [place]   FUNCTIONAL TESTS:  5 x STS: 42 seconds increased pain   GAIT: Distance walked: 10 ft  Assistive device utilized: None Level of assistance: Complete Independence Comments: WBOS, excessive frontal plane movement   [facility] Adult PT Treatment:                                                DATE: 08/04/22 Therapeutic Exercise: Demonstrated and issue initial HEP.    Therapeutic Activity: Education on assessment findings that will be addressed throughout duration of POC.      PATIENT EDUCATION:  Education details: see treatment  Person educated: Patient Education method: Explanation, Demonstration, Tactile cues, Verbal cues, and Handouts Education comprehension: verbalized understanding, returned demonstration, verbal cues required, tactile cues required, and needs further education  HOME EXERCISE PROGRAM: Access Code: [email or web address] URL: [email or web address] Date: 08/05/2022 Prepared by: Letitia Libra  Exercises - Supine Heel Slide  - 2 x daily - 7 x weekly - 1 sets - 10 reps - 5 sec  hold - Long Sitting Calf Stretch with Strap  - 2  x daily - 7 x weekly - 3 sets - 30 sec  hold - Hooklying Clamshell with Resistance  - 2 x daily - 7 x weekly - 2 sets - 10 reps - Seated Long Arc Quad  - 2 x daily - 7 x weekly - 2 sets - 10 reps  ASSESSMENT:  CLINICAL IMPRESSION: Patient is a 62 y.o. female who was seen today for physical therapy evaluation and treatment for chronic bilateral knee pain. Upon assessment she is noted to have limited and painful knee flexion AROM, hip/knee strength deficits, gait impairments, and decreased functional strength as measured by 5 x STS. She will benefit from skilled PT to address the above stated deficits with the inclusion of  aquatic PT in order to optimize her function and assist in overall pain reduction.   OBJECTIVE IMPAIRMENTS: Abnormal gait, decreased activity tolerance, decreased balance, decreased knowledge of condition, decreased mobility, difficulty walking, decreased ROM, decreased strength, increased fascial restrictions, impaired flexibility, improper body mechanics, postural dysfunction, obesity, and pain.   ACTIVITY LIMITATIONS: carrying, lifting, bending, standing, squatting, stairs, transfers, locomotion level, and caring for others  PARTICIPATION LIMITATIONS: meal prep, cleaning, laundry, shopping, community activity, and yard work  PERSONAL FACTORS: Age, Fitness, Time since onset of injury/illness/exacerbation, and 3+ comorbidities: see PMH above  are also affecting patient's functional outcome.   REHAB POTENTIAL: Good  CLINICAL DECISION MAKING: Evolving/moderate complexity  EVALUATION COMPLEXITY: Moderate   GOALS: Goals reviewed with patient? Yes  SHORT TERM GOALS: Target date: 09/02/2022   Patient will be independent and compliant with initial HEP.   Baseline: issued at eva;  Goal status: INITIAL  2.  Patient will improve bilateral knee flexion AROM by 10 degrees to improve ease of sit to stand transfers.  Baseline: see above Goal status: INITIAL  3.  Patient will  demonstrate at least 8 degrees of bilateral ankle DF AROM to reduce stress on knees with closed chain tasks.  Baseline: see above  Goal status: INITIAL   LONG TERM GOALS: Target date: 10/04/2022    Patient will score at least 42% on FOTO to signify clinically meaningful improvement in functional abilities.   Baseline: see above Goal status: INITIAL  2.  Patient will complete 5 x STS in </= 32 seconds to improve efficiency with transfers.   Baseline: see above  Goal status: INITIAL  3.  Patient will demonstrate 5/5 bilateral knee strength to improve stability with stair/curb negotiation.  Baseline: see above Goal status: INITIAL  4.  Patient will demonstrate at least 4/5 bilateral hip extensor/abductor strength to improve stability about the chain with prolonged walking/standing activity.  Baseline: see above  Goal status: INITIAL  5.  Patient will be independent with advanced home program to progress/maintain current level of function.  Baseline: initial HEP issued  Goal status: INITIAL    PLAN:  PT FREQUENCY: 1-2x/week  PT DURATION: 8 weeks  PLANNED INTERVENTIONS: Therapeutic exercises, Therapeutic activity, Neuromuscular re-education, Balance training, Gait training, Patient/Family education, Self Care, Joint mobilization, Stair training, Aquatic Therapy, Dry Needling, Cryotherapy, Moist heat, Manual therapy, and Re-evaluation  PLAN FOR NEXT SESSION: review and progress HEP prn; quad and calf stretching, hip/knee strengthening   Letitia Libra, PT, DPT, ATC 08/05/22 1:18 PM  [employer] Authorization   Choose one: Rehabilitative  Standardized Assessment or Functional Outcome Tool: See Pain Assessment and Other FOTO  Score or Percent Disability: 27% function   Body Parts Treated (Select each separately):  Knee. Overall deficits/functional limitations for body part selected: severe    If treatment provided at initial evaluation, no treatment charged due to lack of  authorization.   Letitia Libra, PT, DPT, ATC 11/18/22 11:15 AM

## 2022-08-04 NOTE — Progress Notes (Signed)
Let patient know mammogram normal. Recheck in one year

## 2022-08-04 NOTE — Telephone Encounter (Signed)
Pt was called and is aware of results, DOB was confirmed.  ?

## 2022-08-05 ENCOUNTER — Ambulatory Visit: Payer: Medicaid Other | Attending: Orthopaedic Surgery

## 2022-08-05 ENCOUNTER — Other Ambulatory Visit: Payer: Self-pay

## 2022-08-05 DIAGNOSIS — G8929 Other chronic pain: Secondary | ICD-10-CM | POA: Diagnosis present

## 2022-08-05 DIAGNOSIS — Z6841 Body Mass Index (BMI) 40.0 and over, adult: Secondary | ICD-10-CM | POA: Insufficient documentation

## 2022-08-05 DIAGNOSIS — M25562 Pain in left knee: Secondary | ICD-10-CM | POA: Insufficient documentation

## 2022-08-05 DIAGNOSIS — M6281 Muscle weakness (generalized): Secondary | ICD-10-CM | POA: Insufficient documentation

## 2022-08-05 DIAGNOSIS — M25561 Pain in right knee: Secondary | ICD-10-CM | POA: Insufficient documentation

## 2022-08-05 NOTE — Patient Instructions (Signed)
 Aquatic Therapy at [facility]-  What to Expect!  Where:   [facility] [address] [place], [place] [address] Rehab phone [phone]  NOTE:  You will receive an automated phone message reminding you of your appt and it will say the appointment is at the [address] [facility] [address] [facility] clinic.          How to Prepare: Please make sure you drink 8 ounces of water about one hour prior to your pool session A caregiver may attend if needed with the patient to help assist as needed. A caregiver can sit in the pool room on chair. Please arrive IN YOUR SUIT and 15 minutes prior to your appointment - this helps to avoid delays in starting your session. Please make sure to attend to any toileting needs prior to entering the pool Locker rooms for changing are provided.   There is direct access to the pool deck form the locker room.  You can lock your belongings in a locker with lock provided. Once on the pool deck your therapist will ask if you have signed the Patient  Consent and Assignment of Benefits form before beginning treatment Your therapist may take your blood pressure prior to, during and after your session if indicated We usually try and create a home exercise program based on activities we do in the pool.  Please be thinking about who might be able to assist you in the pool should you need to participate in an aquatic home exercise program at the time of discharge if you need assistance.  Some patients do not want to or do not have the ability to participate in an aquatic home program - this is not a barrier in any way to you participating in aquatic therapy as part of your current therapy plan! After Discharge from PT, you can continue using home program at  the [facility]/, there is a drop-in fee for $5 ($45 a month)or for 60 years  or older $4.00 ($40 a month for seniors ) or any local [employer] pool.  Memberships for purchase are  available for gym/pool at [facility]  IT IS VERY IMPORTANT THAT YOUR LAST VISIT BE IN THE CLINIC AT [address] AFTER YOUR LAST AQUATIC VISIT.  PLEASE MAKE SURE THAT YOU HAVE A [address]  APPOINTMENT SCHEDULED.   About the pool: Pool is located approximately 500 FT from the entrance of the building.  Please bring a support person if you need assistance traveling this      distance.   Your therapist will assist you in entering the water; there are two ways to           enter: stairs with railings, and a mechanical lift. Your therapist will determine the most appropriate way for you.  Water temperature is usually between 88-90 degrees  There may be up to 2 other swimmers in the pool at the same time  The pool deck is tile, please wear shoes with good traction if you prefer not to be barefoot.    Contact Info:  For appointment scheduling and cancellations:         Please call the [facility]  PH:[phone]              Aquatic Therapy  [facility]patient [facility]       All sessions are 45 minutes                                                    

## 2022-08-18 ENCOUNTER — Ambulatory Visit (HOSPITAL_COMMUNITY): Payer: Medicaid Other | Admitting: Licensed Clinical Social Worker

## 2022-08-18 ENCOUNTER — Encounter (HOSPITAL_COMMUNITY): Payer: Self-pay

## 2022-08-18 DIAGNOSIS — F4310 Post-traumatic stress disorder, unspecified: Secondary | ICD-10-CM

## 2022-08-18 DIAGNOSIS — F339 Major depressive disorder, recurrent, unspecified: Secondary | ICD-10-CM

## 2022-08-18 NOTE — Progress Notes (Unsigned)
   THERAPIST PROGRESS NOTE Virtual Visit via Telephone Note  I connected with Lydia Vasquez on 08/18/22 at  3:00 PM EDT by telephone and verified that I am speaking with the correct person using two identifiers.  Location: Patient: [facility]  Provider: Providers Home    I discussed the limitations, risks, security and privacy concerns of performing an evaluation and management service by telephone and the availability of in person appointments. I also discussed with the patient that there may be a patient responsible charge related to this service. The patient expressed understanding and agreed to proceed.     I discussed the assessment and treatment plan with the patient. The patient was provided an opportunity to ask questions and all were answered. The patient agreed with the plan and demonstrated an understanding of the instructions.   The patient was advised to call back or seek an in-person evaluation if the symptoms worsen or if the condition fails to improve as anticipated.  I provided 30 minutes of non-face-to-face time during this encounter.   Weber Cooks, LCSW   Participation Level: Active  Behavioral Response: CasualAlertDepressed, Hopeless, Worthless, and Tearful   Type of Therapy: Individual Therapy  Treatment Goals addressed: treatment plan updated   ProgressTowards Goals: Revised 08/18/22   Interventions: CBT and Motivational Interviewing .   Suicidal/Homicidal: Nowithout intent/plan  Therapist Response:    Pt was alert and oriented x 5. She was not observed as pt needed to do telephone visit today. LCSW advised pt moving forward that session will need to be done in person or call my chart help desk to figure out technical difficulties with phone. Pt was agreeable to do session in person moving forward.   Patient reports new stressors for grief and loss, anhedonia, and trauma from her past.  Patient reports that she has been also dealing with illness  issues for cancer.  Patient reports that she very rarely is getting out of the house.  Patient reports primary motivator is new grandchild.  Patient reports that she lost her mother and reports sadness and tearfulness.  She reports that she would like to start working through her thoughts, feelings, and create coping skills. Intervention/Plan: Plan for patient is to reestablish care with this LCSW every 3 weeks.  LCSW updated treatment plan to reflect new goals and updated treatment dates.  LCSW psychoanalytic therapy for patient to express thoughts, feelings and concerns using free association.  LCSW supportive therapy for praise and encouragement.  LCSW used motivational interviewing for reflective listening, open-ended questions, and positive affirmations.   Plan: Return again in 4 weeks.  Diagnosis: Major depression, recurrent, chronic (HCC)  PTSD (post-traumatic stress disorder)  Collaboration of Care: Other None today   Patient/Guardian was advised Release of Information must be obtained prior to any record release in order to collaborate their care with an outside provider. Patient/Guardian was advised if they have not already done so to contact the registration department to sign all necessary forms in order for [place] to release information regarding their care.   Consent: Patient/Guardian gives verbal consent for treatment and assignment of benefits for services provided during this visit. Patient/Guardian expressed understanding and agreed to proceed.   Weber Cooks, LCSW 08/18/2022

## 2022-08-19 ENCOUNTER — Ambulatory Visit: Payer: Medicaid Other | Admitting: Critical Care Medicine

## 2022-08-29 ENCOUNTER — Telehealth (INDEPENDENT_AMBULATORY_CARE_PROVIDER_SITE_OTHER): Payer: Medicaid Other | Admitting: Student in an Organized Health Care Education/Training Program

## 2022-08-29 ENCOUNTER — Encounter (HOSPITAL_COMMUNITY): Payer: Self-pay | Admitting: Student in an Organized Health Care Education/Training Program

## 2022-08-29 DIAGNOSIS — G4700 Insomnia, unspecified: Secondary | ICD-10-CM | POA: Diagnosis not present

## 2022-08-29 DIAGNOSIS — F339 Major depressive disorder, recurrent, unspecified: Secondary | ICD-10-CM

## 2022-08-29 DIAGNOSIS — F4310 Post-traumatic stress disorder, unspecified: Secondary | ICD-10-CM | POA: Diagnosis not present

## 2022-08-29 DIAGNOSIS — F411 Generalized anxiety disorder: Secondary | ICD-10-CM

## 2022-08-29 MED ORDER — QUETIAPINE FUMARATE 100 MG PO TABS
100.0000 mg | ORAL_TABLET | Freq: Every day | ORAL | 1 refills | Status: AC
Start: 2022-08-29 — End: ?

## 2022-08-29 MED ORDER — MIRTAZAPINE 45 MG PO TABS
45.0000 mg | ORAL_TABLET | Freq: Every day | ORAL | 1 refills | Status: AC
Start: 2022-08-29 — End: ?

## 2022-08-29 MED ORDER — VENLAFAXINE HCL ER 75 MG PO CP24
75.0000 mg | ORAL_CAPSULE | Freq: Every day | ORAL | 1 refills | Status: AC
Start: 2022-08-29 — End: ?
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
## 2022-09-24 ENCOUNTER — Ambulatory Visit (HOSPITAL_COMMUNITY): Payer: Medicaid Other | Admitting: Licensed Clinical Social Worker

## 2022-10-15 ENCOUNTER — Encounter (HOSPITAL_COMMUNITY): Payer: Self-pay

## 2022-10-15 ENCOUNTER — Ambulatory Visit (INDEPENDENT_AMBULATORY_CARE_PROVIDER_SITE_OTHER): Payer: Medicaid Other | Admitting: Licensed Clinical Social Worker

## 2022-10-15 ENCOUNTER — Encounter (HOSPITAL_COMMUNITY): Payer: Self-pay | Admitting: Licensed Clinical Social Worker

## 2022-10-15 DIAGNOSIS — F339 Major depressive disorder, recurrent, unspecified: Secondary | ICD-10-CM | POA: Diagnosis not present

## 2022-10-15 DIAGNOSIS — F4310 Post-traumatic stress disorder, unspecified: Secondary | ICD-10-CM

## 2022-10-15 NOTE — Progress Notes (Signed)
THERAPIST PROGRESS NOTE  Session Time: 30   Participation Level: Active  Behavioral Response: CasualAlertAnxious and Depressed  Type of Therapy: Individual Therapy  Treatment Goals addressed:  Active     Anxiety Disorder CCP Problem  1 GAD      LTG: Patient will score less than 5 on the Generalized Anxiety Disorder 7 Scale (GAD-7) (Not Progressing)     Start:  06/19/21    Expected End:  03/20/23         STG: Patient will participate in at least 80% of scheduled individual psychotherapy sessions (Progressing)     Start:  06/19/21    Expected End:  03/20/23         STG: Patient will attend couples/family counseling sessions with partner/family member 1x per week for the next 4 weeks (Progressing)     Start:  06/19/21    Expected End:  03/20/23         Discuss risks and benefits of medication treatment options for this problem and prescribe as indicated (Completed)     Start:  06/19/21    End:  09/10/21      Encourage patient to take psychotropic medication as prescribed (Completed)     Start:  06/19/21    End:  09/10/21      Review results of GAD-7 with the patient to track progress (Completed)     Start:  06/19/21    End:  09/10/21      Work with patient to track symptoms, triggers and/or skill use through a mood chart, diary card, or journal (Completed)     Start:  06/19/21    End:  09/10/21        Depression CCP Problem  1 MDD     LTG: Lailanie WILL SCORE LESS THAN 10 ON THE PATIENT HEALTH QUESTIONNAIRE (PHQ-9) (Progressing)     Start:  02/12/21    Expected End:  03/20/23         STG: Christean WILL PARTICIPATE IN AT LEAST 80% OF SCHEDULED INDIVIDUAL PSYCHOTHERAPY SESSIONS (Progressing)     Start:  02/12/21    Expected End:  03/20/23         STG: Shaquanna WILL COMPLETE AT LEAST 80% OF ASSIGNED HOMEWORK (Progressing)     Start:  02/12/21    Expected End:  03/20/23         STG: Reduce overall depression score by a minimum of 25% on the Patient Health  Questionnaire (PHQ-9) or the Montgomery-Asberg Depression Rating Scale (MADRS) (Progressing)     Start:  02/12/21    Expected End:  03/20/23         WORK WITH Naiah TO TRACK SYMPTOMS, TRIGGERS AND/OR SKILL USE THROUGH A MOOD CHART, DIARY CARD, OR JOURNAL (Completed)     Start:  02/12/21    End:  09/10/21      ENCOURAGE Chudney TO PARTICIPATE IN RECOVERY PEER SUPPORT ACTIVITIES WEEKLY (Completed)     Start:  02/12/21    End:  10/09/21      Administer the PHQ-9 or MADRS weekly for 4 weeks (Completed)     Start:  02/12/21    End:  09/10/21      WORK WITH Adleigh TO IDENTIFY THEIR 3 PERSONAL GOALS FOR MANAGING DEPRESSION SYMPTOMS AND ADD TO THIS PLAN (Completed)     Start:  02/12/21    End:  10/09/21      EDUCATE Coriana ON COGNITIVE DISTORTIONS AND THE RATIONALE FOR TREATMENT OF DEPRESSION (Completed)  Start:  02/12/21    End:  10/09/21         ProgressTowards Goals: Revised due to pt stopping therapy for the past 6 months   Interventions: CBT, Motivational Interviewing, and Supportive  Summary: Perrin Petrowski is a 62 y.o. female who presents with flat and depressed mood\affect.  Patient was pleasant, cooperative, maintained good eye contact.  Aerial was alert and oriented x 5.  Patient comes in today after struggling to reestablish care.  Patient reports that she has had multiple no-shows due to lack of transportation and was struggling with virtual therapy sessions.  Patient reports that she would like to start therapy at least 1 time monthly.  LCSW was agreeable to restart therapy.  Patient reports primary stressors as anxiety for health.  Patient states that she has multiple appointments for mammograms, colonoscopy, and follow-ups with her PCP.  Patient reports that she is struggling with grief and loss of her mother.  Patient also reports having stressors for symptoms endorsed for tension, worry, restlessness, and panic attacks.  Suicidal/Homicidal: Nowithout  intent/plan  Therapist Response:     Interventions/Plan: LCSW supportive therapy for praise and encouragement.  LCSW use education on "tips for anxiety".  LCSW went over worksheet for "relaxation techniques".  LCSW used motivational interviewing for open-ended questions, reflective listening, and positive affirmations.  LCSW used unconditional regard for nonjudgmental stances.  Plan: Return again in 4 weeks.  Diagnosis: Major depression, recurrent, chronic (HCC)  PTSD (post-traumatic stress disorder)  Collaboration of Care: Other None today   Patient/Guardian was advised Release of Information must be obtained prior to any record release in order to collaborate their care with an outside provider. Patient/Guardian was advised if they have not already done so to contact the registration department to sign all necessary forms in order for [place] to release information regarding their care.   Consent: Patient/Guardian gives verbal consent for treatment and assignment of benefits for services provided during this visit. Patient/Guardian expressed understanding and agreed to proceed.   Weber Cooks, LCSW 10/15/2022

## 2022-10-21 ENCOUNTER — Ambulatory Visit (INDEPENDENT_AMBULATORY_CARE_PROVIDER_SITE_OTHER): Payer: Medicaid Other | Admitting: Student

## 2022-10-21 VITALS — BP 155/97 | HR 65 | Ht 70.0 in | Wt 302.4 lb

## 2022-10-21 DIAGNOSIS — F4310 Post-traumatic stress disorder, unspecified: Secondary | ICD-10-CM

## 2022-10-21 DIAGNOSIS — F333 Major depressive disorder, recurrent, severe with psychotic symptoms: Secondary | ICD-10-CM | POA: Diagnosis not present

## 2022-10-21 DIAGNOSIS — R29818 Other symptoms and signs involving the nervous system: Secondary | ICD-10-CM | POA: Diagnosis not present

## 2022-10-21 NOTE — Progress Notes (Signed)
BH MD/PA/NP OP Progress Note   10/21/2022 8:09 AM Rejina Odle  MRN:  [id number]  Chief Complaint:  No chief complaint on file.  HPI:  Kitt Minardi is a 62 yr old female who presents for Follow Up and Medication Management.  PPHx is significant for MDD, Recurrent, Anxiety, PTSD and Insomnia, and no history of Suicide Attempts, Self Injurious Behavior, or Psychiatric Hospitalizations.     She reports that things have been "ok" overall. She does have some days where she feels down and depressed as well as anxiety regarding her health 2/2 history of cervical cancer. She has also becomes short of breath when walking and talking   Started a plant based diet, has lost 4 lbs. She walks through her home for approx 15 minutes, but has to stop to catch her breath. Appetite is up and down depending on mood.   Sleep is fair, average of 4 hours of interrupted sleep. Snores, sometimes wakes up coughing. Lower energy.   Grieving.   Medications still causing dizziness when she takes it, then becomes drowsy.   Denies cigarettes, alcohol, illicit drug use.     Visit Diagnosis:  No diagnosis found.    Past Psychiatric History: MDD, Recurrent, Anxiety, PTSD and Insomnia, and no history of Suicide Attempts, Self Injurious Behavior, or Psychiatric Hospitalizations.    Past Medical History:  Past Medical History:  Diagnosis Date  . Anxiety   . Depression   . Hyperlipidemia   . Hypertension   . Ventricular tachycardia (HCC) 06/11/2020    Past Surgical History:  Procedure Laterality Date  . ABDOMINAL HYSTERECTOMY    . CESAREAN SECTION      Family Psychiatric History: Maternal Cousin- Schizophrenia or Bipolar Disorder No known Substance Abuse or Suicides.  Family History:  Family History  Problem Relation Age of Onset  . Stroke Mother   . Dementia Mother     Social History:  Social History   Socioeconomic History  . Marital status: Single    Spouse name: Not on file  . Number  of children: Not on file  . Years of education: Not on file  . Highest education level: Not on file  Occupational History  . Not on file  Tobacco Use  . Smoking status: Former    Types: Cigarettes  . Smokeless tobacco: Never  Vaping Use  . Vaping status: Never Used  Substance and Sexual Activity  . Alcohol use: Not Currently  . Drug use: Never  . Sexual activity: Not Currently  Other Topics Concern  . Not on file  Social History Narrative  . Not on file   Social Determinants of Health   Financial Resource Strain: Low Risk  (02/12/2021)   Overall Financial Resource Strain (CARDIA)   . Difficulty of Paying Living Expenses: Not hard at all  Food Insecurity: Food Insecurity Present (10/15/2022)   Hunger Vital Sign   . Worried About Programme researcher, broadcasting/film/video in the Last Year: Sometimes true   . Ran Out of Food in the Last Year: Sometimes true  Transportation Needs: Unmet Transportation Needs (10/15/2022)   PRAPARE - Transportation   . Lack of Transportation (Medical): Yes   . Lack of Transportation (Non-Medical): Yes  Physical Activity: Inactive (10/15/2022)   Exercise Vital Sign   . Days of Exercise per Week: 0 days   . Minutes of Exercise per Session: 0 min  Stress: Stress Concern Present (10/15/2022)   [employer] of Occupational Health - Occupational Stress Questionnaire   .  Feeling of Stress : Very much  Social Connections: Socially Isolated (10/15/2022)   Social Connection and Isolation Panel [NHANES]   . Frequency of Communication with Friends and Family: Twice a week   . Frequency of Social Gatherings with Friends and Family: Once a week   . Attends Religious Services: Never   . Active Member of Clubs or Organizations: No   . Attends Banker Meetings: Never   . Marital Status: Never married    Allergies: No Known Allergies  Metabolic Disorder Labs: Lab Results  Component Value Date   HGBA1C 6.0 (H) 06/18/2022   No results found for: "PROLACTIN" Lab  Results  Component Value Date   CHOL 192 10/26/2019   TRIG 332 (H) 10/26/2019   HDL <10 (L) 10/26/2019   CHOLHDL NOT CALCULATED 10/26/2019   VLDL 66 (H) 10/26/2019   LDLCALC NOT CALCULATED 10/26/2019   No results found for: "TSH"  Therapeutic Level Labs: No results found for: "LITHIUM" No results found for: "VALPROATE" No results found for: "CBMZ"  Current Medications: Current Outpatient Medications  Medication Sig Dispense Refill  . amLODipine (NORVASC) 5 MG tablet Take 1 tablet (5 mg total) by mouth daily. 90 tablet 2  . B Complex-C-Folic Acid (B COMPLEX-VITAMIN C-FOLIC ACID) 1 MG tablet Take 1 tablet by mouth daily with breakfast. 60 tablet 2  . carvedilol (COREG) 12.5 MG tablet Take 1 tablet (12.5 mg total) by mouth 2 (two) times daily with a meal. 60 tablet 3  . cefdinir (OMNICEF) 300 MG capsule Take 1 capsule (300 mg total) by mouth 2 (two) times daily. 10 capsule 0  . famotidine (PEPCID) 20 MG tablet TAKE 1 TABLET(20 MG) BY MOUTH TWICE DAILY 120 tablet 4  . mirtazapine (REMERON) 45 MG tablet Take 1 tablet (45 mg total) by mouth at bedtime. 30 tablet 1  . QUEtiapine (SEROQUEL) 100 MG tablet Take 1 tablet (100 mg total) by mouth at bedtime. 30 tablet 1  . venlafaxine XR (EFFEXOR XR) 75 MG 24 hr capsule Take 1 capsule (75 mg total) by mouth daily. 30 capsule 1   No current facility-administered medications for this visit.     Musculoskeletal: Strength & Muscle Tone: within normal limits Gait & Station: normal Patient leans: N/A  Psychiatric Specialty Exam: Review of Systems  Respiratory:  Negative for shortness of breath.   Cardiovascular:  Negative for chest pain.  Gastrointestinal:  Negative for abdominal pain, constipation, diarrhea, nausea and vomiting.  Neurological:  Positive for dizziness (occasional). Negative for weakness and headaches.  Psychiatric/Behavioral:  Positive for dysphoric mood. Negative for hallucinations and suicidal ideas. The patient is  nervous/anxious.     There were no vitals taken for this visit.There is no height or weight on file to calculate BMI.  General Appearance: Could not assess  Eye Contact:  Could not assess  Speech:  Clear and Coherent and Normal Rate  Volume:  Normal  Mood:  Anxious  Affect:  Congruent  Thought Process:  Coherent and Goal Directed  Orientation:  Full (Time, Place, and Person)  Thought Content: WDL and Logical   Suicidal Thoughts:  No  Homicidal Thoughts:  No  Memory:  Immediate;   Good Recent;   Good  Judgement:  Good  Insight:  Good  Psychomotor Activity:  Could not assess  Concentration:  Concentration: Good and Attention Span: Good  Recall:  Good  Fund of Knowledge: Good  Language: Good  Akathisia:  Negative  Handed:  Right  AIMS (if indicated):  not done  Assets:  Communication Skills Desire for Improvement Housing Leisure Time Resilience Social Support  ADL's:  Intact  Cognition: WNL  Sleep:  Fair   Screenings: GAD-7    Advertising copywriter from 10/15/2022 in [facility] Office Visit from 06/18/2022 in [facility] Counselor from 10/09/2021 in [facility] Counselor from 09/10/2021 in [facility] Counselor from 06/19/2021 in [facility]  Total GAD-7 Score 19 21 18 14 19       PHQ2-9    Flowsheet Row Counselor from 10/15/2022 in [facility] Office Visit from 06/18/2022 in [facility] Counselor from 10/09/2021 in [facility] Counselor from 09/10/2021 in [facility] Counselor from 06/19/2021 in [facility]  PHQ-2 Total Score 4 2 3 6 3   PHQ-9 Total Score 15 18 17 19 20       Flowsheet Row Counselor from 10/15/2022 in [facility] Counselor from 10/09/2021  in [facility] Counselor from 09/10/2021 in [facility]  C-SSRS RISK CATEGORY Low Risk Low Risk Low Risk        Assessment and Plan:  Kerston Landeck is a 62 yr old female who presents for Follow Up and Medication Management.  PPHx is significant for MDD, Recurrent, Anxiety, PTSD, Grief, and Insomnia, and no history of Suicide Attempts, Self Injurious Behavior, or Psychiatric Hospitalizations.     Laruen continues to report a stable mood with good and bad days. Her biggest concern today is that she is not getting adequate sleep, leading to decreased energy and general fatigue. Her blood pressure is elevated today, despite taking her antihypertensives at home prior to presentation today. She does not have a BP cuff at home, but will be seeing her PCP for BP monitoring on Saturday. Due to elevated BP, will opt to d/c Effexor (although elevated BP precedes    MDD, Recurrent, Chronic  PTSD  GAD  Insomnia : -Continue Remeron 45 mg QHS for depression, anxiety, and sleep.  -Continue Seroquel 100 mg QHS for depression, anxiety, and sleep.  -Discontinue Effexor XR 75 mg daily for depression and anxiety - START Cymbalta 30 mg daily for depression, anxiety, and neuropathy  #Suspected OSA - Referral for sleep study placed  Collaboration of Care: Collaboration of Care: Other provider involved in patient's care AEB [facility] Therapist  Patient/Guardian was advised Release of Information must be obtained prior to any record release in order to collaborate their care with an outside provider. Patient/Guardian was advised if they have not already done so to contact the registration department to sign all necessary forms in order for [place] to release information regarding their care.   Consent: Patient/Guardian gives verbal consent for treatment and assignment of benefits for services provided during this visit. Patient/Guardian expressed understanding and  agreed to proceed.    Lamar Sprinkles, MD 10/21/2022, 8:09 AM

## 2022-10-21 NOTE — Patient Instructions (Signed)
Tra[facility] Focused Therapists in [place], [place] that accept Medicaid:  Philipp Ovens Clinical Social Work/Therapist, MSW, [address] [place], [place] [address] [phone] Counselor, [facility] [place], [place] [address] [phone]  [facility] Clinical Social Work/Therapist, MSW, [place], [place] [address] [phone]    [facility] Clinical Social Work/Therapist, [address] [place], [place] [address] [phone] Counselor, MS/EdS, [facility], [facility] [place], [place] [address] [phone]   [facility] [place] Clinical Social Ivor Reining [place], [place] [address] [phone]   [address] Licensed Professional Counselor, [place], Mliss Sax, [facility] [place], [place] [address] [phone]   Carmon Ginsberg [facility] Clinical Social Work/Therapist, MSW, [place], [place] [address] [phone]

## 2022-10-22 MED ORDER — DULOXETINE HCL 30 MG PO CPEP: 30.0000 mg | ORAL_CAPSULE | Freq: Every day | ORAL | 1 refills | Status: DC

## 2022-10-22 MED ORDER — QUETIAPINE FUMARATE 100 MG PO TABS
100.0000 mg | ORAL_TABLET | Freq: Every day | ORAL | 1 refills | Status: DC
Start: 2022-10-22 — End: 2023-01-06

## 2022-10-22 MED ORDER — MIRTAZAPINE 45 MG PO TABS
45.0000 mg | ORAL_TABLET | Freq: Every day | ORAL | 1 refills | Status: DC
Start: 2022-10-22 — End: 2023-01-06

## 2022-10-22 NOTE — Progress Notes (Incomplete)
BH MD/PA/NP OP Progress Note   10/21/2022 8:09 AM Lydia Vasquez  MRN:  [id number]  Chief Complaint:  No chief complaint on file.  HPI:  Lydia Vasquez is a 62 yr old female who presents for Follow Up and Medication Management.  PPHx is significant for MDD, Recurrent, Anxiety, PTSD and Insomnia, and no history of Suicide Attempts, Self Injurious Behavior, or Psychiatric Hospitalizations.     She reports that things have been "ok" overall. She does have some days where she feels down and depressed as well as anxiety regarding her health 2/2 history of cervical cancer. She has also becomes short of breath when walking and talking   Started a plant based diet, has lost 4 lbs. She walks through her home for approx 15 minutes, but has to stop to catch her breath. Appetite is up and down depending on mood.   Sleep is fair, average of 4 hours of interrupted sleep. Snores, sometimes wakes up coughing. Lower energy.   Grieving.   Medications still causing dizziness when she takes it, but only briefly, then she becomes drowsy.   Denies cigarettes, alcohol, illicit drug use.     Visit Diagnosis:  No diagnosis found.    Past Psychiatric History: MDD, Recurrent, Anxiety, PTSD and Insomnia, and no history of Suicide Attempts, Self Injurious Behavior, or Psychiatric Hospitalizations.    Past Medical History:  Past Medical History:  Diagnosis Date  . Anxiety   . Depression   . Hyperlipidemia   . Hypertension   . Ventricular tachycardia (HCC) 06/11/2020    Past Surgical History:  Procedure Laterality Date  . ABDOMINAL HYSTERECTOMY    . CESAREAN SECTION      Family Psychiatric History: Maternal Cousin- Schizophrenia or Bipolar Disorder No known Substance Abuse or Suicides.  Family History:  Family History  Problem Relation Age of Onset  . Stroke Mother   . Dementia Mother     Social History:  Social History   Socioeconomic History  . Marital status: Single    Spouse name:  Not on file  . Number of children: Not on file  . Years of education: Not on file  . Highest education level: Not on file  Occupational History  . Not on file  Tobacco Use  . Smoking status: Former    Types: Cigarettes  . Smokeless tobacco: Never  Vaping Use  . Vaping status: Never Used  Substance and Sexual Activity  . Alcohol use: Not Currently  . Drug use: Never  . Sexual activity: Not Currently  Other Topics Concern  . Not on file  Social History Narrative  . Not on file   Social Determinants of Health   Financial Resource Strain: Low Risk  (02/12/2021)   Overall Financial Resource Strain (CARDIA)   . Difficulty of Paying Living Expenses: Not hard at all  Food Insecurity: Food Insecurity Present (10/15/2022)   Hunger Vital Sign   . Worried About Programme researcher, broadcasting/film/video in the Last Year: Sometimes true   . Ran Out of Food in the Last Year: Sometimes true  Transportation Needs: Unmet Transportation Needs (10/15/2022)   PRAPARE - Transportation   . Lack of Transportation (Medical): Yes   . Lack of Transportation (Non-Medical): Yes  Physical Activity: Inactive (10/15/2022)   Exercise Vital Sign   . Days of Exercise per Week: 0 days   . Minutes of Exercise per Session: 0 min  Stress: Stress Concern Present (10/15/2022)   [employer] of Occupational Health - Occupational Stress  Questionnaire   . Feeling of Stress : Very much  Social Connections: Socially Isolated (10/15/2022)   Social Connection and Isolation Panel [NHANES]   . Frequency of Communication with Friends and Family: Twice a week   . Frequency of Social Gatherings with Friends and Family: Once a week   . Attends Religious Services: Never   . Active Member of Clubs or Organizations: No   . Attends Banker Meetings: Never   . Marital Status: Never married    Allergies: No Known Allergies  Metabolic Disorder Labs: Lab Results  Component Value Date   HGBA1C 6.0 (H) 06/18/2022   No results found  for: "PROLACTIN" Lab Results  Component Value Date   CHOL 192 10/26/2019   TRIG 332 (H) 10/26/2019   HDL <10 (L) 10/26/2019   CHOLHDL NOT CALCULATED 10/26/2019   VLDL 66 (H) 10/26/2019   LDLCALC NOT CALCULATED 10/26/2019   No results found for: "TSH"  Therapeutic Level Labs: No results found for: "LITHIUM" No results found for: "VALPROATE" No results found for: "CBMZ"  Current Medications: Current Outpatient Medications  Medication Sig Dispense Refill  . amLODipine (NORVASC) 5 MG tablet Take 1 tablet (5 mg total) by mouth daily. 90 tablet 2  . B Complex-C-Folic Acid (B COMPLEX-VITAMIN C-FOLIC ACID) 1 MG tablet Take 1 tablet by mouth daily with breakfast. 60 tablet 2  . carvedilol (COREG) 12.5 MG tablet Take 1 tablet (12.5 mg total) by mouth 2 (two) times daily with a meal. 60 tablet 3  . cefdinir (OMNICEF) 300 MG capsule Take 1 capsule (300 mg total) by mouth 2 (two) times daily. 10 capsule 0  . famotidine (PEPCID) 20 MG tablet TAKE 1 TABLET(20 MG) BY MOUTH TWICE DAILY 120 tablet 4  . mirtazapine (REMERON) 45 MG tablet Take 1 tablet (45 mg total) by mouth at bedtime. 30 tablet 1  . QUEtiapine (SEROQUEL) 100 MG tablet Take 1 tablet (100 mg total) by mouth at bedtime. 30 tablet 1  . venlafaxine XR (EFFEXOR XR) 75 MG 24 hr capsule Take 1 capsule (75 mg total) by mouth daily. 30 capsule 1   No current facility-administered medications for this visit.     Musculoskeletal: Strength & Muscle Tone: within normal limits Gait & Station: normal Patient leans: N/A  Psychiatric Specialty Exam: Review of Systems  Respiratory:  Negative for shortness of breath.   Cardiovascular:  Negative for chest pain.  Gastrointestinal:  Negative for abdominal pain, constipation, diarrhea, nausea and vomiting.  Neurological:  Positive for dizziness (occasional). Negative for weakness and headaches.  Psychiatric/Behavioral:  Positive for dysphoric mood. Negative for hallucinations and suicidal ideas.  The patient is nervous/anxious.     There were no vitals taken for this visit.There is no height or weight on file to calculate BMI.  General Appearance: Could not assess  Eye Contact:  Could not assess  Speech:  Clear and Coherent and Normal Rate  Volume:  Normal  Mood:  Anxious  Affect:  Congruent  Thought Process:  Coherent and Goal Directed  Orientation:  Full (Time, Place, and Person)  Thought Content: WDL and Logical   Suicidal Thoughts:  No  Homicidal Thoughts:  No  Memory:  Immediate;   Good Recent;   Good  Judgement:  Good  Insight:  Good  Psychomotor Activity:  Could not assess  Concentration:  Concentration: Good and Attention Span: Good  Recall:  Good  Fund of Knowledge: Good  Language: Good  Akathisia:  Negative  Handed:  Right  AIMS (if indicated): not done  Assets:  Communication Skills Desire for Improvement Housing Leisure Time Resilience Social Support  ADL's:  Intact  Cognition: WNL  Sleep:  Fair   Screenings: GAD-7    Advertising copywriter from 10/15/2022 in [facility] Office Visit from 06/18/2022 in [facility] Counselor from 10/09/2021 in [facility] Counselor from 09/10/2021 in [facility] Counselor from 06/19/2021 in [facility]  Total GAD-7 Score 19 21 18 14 19       PHQ2-9    Flowsheet Row Counselor from 10/15/2022 in [facility] Office Visit from 06/18/2022 in [facility] Counselor from 10/09/2021 in [facility] Counselor from 09/10/2021 in [facility] Counselor from 06/19/2021 in [facility]  PHQ-2 Total Score 4 2 3 6 3   PHQ-9 Total Score 15 18 17 19 20       Flowsheet Row Counselor from 10/15/2022 in [facility] Counselor  from 10/09/2021 in [facility] Counselor from 09/10/2021 in [facility]  C-SSRS RISK CATEGORY Low Risk Low Risk Low Risk        Assessment and Plan:  Arvie Villarruel is a 62 yr old female who presents for Follow Up and Medication Management.  PPHx is significant for MDD, Recurrent, Anxiety, PTSD, Grief, and Insomnia, and no history of Suicide Attempts, Self Injurious Behavior, or Psychiatric Hospitalizations.     Kianah continues to report a stable mood with good and bad days. Her biggest concern today is that she is not getting adequate sleep, leading to decreased energy and general fatigue. Her blood pressure is elevated today, despite taking her antihypertensives at home prior to presentation today. She does not have a BP cuff at home, but will be seeing her PCP for BP monitoring on Saturday. Due to elevated BP, will opt to d/c Effexor (although elevated BP precedes    MDD, Recurrent, Chronic  PTSD  GAD  Insomnia : -Continue Remeron 45 mg QHS for depression, anxiety, and sleep.  -Continue Seroquel 100 mg QHS for depression, anxiety, and sleep.  -Discontinue Effexor XR 75 mg daily for depression and anxiety - START Cymbalta 30 mg daily for depression, anxiety, and neuropathy  #Suspected OSA - Referral for sleep study placed  Collaboration of Care: Collaboration of Care: Other provider involved in patient's care AEB [facility] Therapist  Patient/Guardian was advised Release of Information must be obtained prior to any record release in order to collaborate their care with an outside provider. Patient/Guardian was advised if they have not already done so to contact the registration department to sign all necessary forms in order for [place] to release information regarding their care.   Consent: Patient/Guardian gives verbal consent for treatment and assignment of benefits for services provided during this visit. Patient/Guardian expressed  understanding and agreed to proceed.    Lamar Sprinkles, MD 10/21/2022 8:09 AM

## 2022-11-13 ENCOUNTER — Ambulatory Visit: Payer: Self-pay

## 2022-11-13 NOTE — Telephone Encounter (Signed)
Chief Complaint: Pain after urinating Symptoms: Odor, urgency, frequency Frequency: 2 days Pertinent Negatives: Patient denies fever, flank pain, other symptoms Disposition: [] ED /[x] Urgent Care (no appt availability in office) / [] Appointment(In office/virtual)/ []  [facility]/ [] Home Care/ [] Refused Recommended Disposition /[] [facility]/ []  Follow-up with PCP Additional Notes: No availability in the office, advised UC. She says she will go on Saturday. I advised I can schedule in office on Saturday with Jomarie Longs. She says transportation will need a 2 day notice, so she wouldn't be able to come until Monday. Advised UC today or tomorrow, but don't wait any longer. She says she will take a cab to the UC if no on is available to take her there.    Summary: urine smell mild pain   Pt called in with UTI symptoms, she says, has smell and hurts a little at the end of urinating. She is asking for med be sent she had before. She doesn't know the name of it.     Reason for Disposition  > 2 UTI's in last year  Answer Assessment - Initial Assessment Questions 1. SEVERITY: "How bad is the pain?"  (e.g., Scale 1-10; mild, moderate, or severe)   - MILD (1-3): complains slightly about urination hurting   - MODERATE (4-7): interferes with normal activities     - SEVERE (8-10): excruciating, unwilling or unable to urinate because of the pain      7-8 after urination 2. FREQUENCY: "How many times have you had painful urination today?"      Each urination today at the end of the urination is pressure 3. PATTERN: "Is pain present every time you urinate or just sometimes?"      Yes 4. ONSET: "When did the painful urination start?"      A couple of days ago 5. FEVER: "Do you have a fever?" If Yes, ask: "What is your temperature, how was it measured, and when did it start?"     No 6. PAST UTI: "Have you had a urine infection before?" If Yes, ask: "When was the last time?" and "What  happened that time?"      Yes, can't remember, took antibiotic 7. CAUSE: "What do you think is causing the painful urination?"  (e.g., UTI, scratch, Herpes sore)     UTI 8. OTHER SYMPTOMS: "Do you have any other symptoms?" (e.g., blood in urine, flank pain, genital sores, urgency, vaginal discharge)     Odor urine, urgency  Protocols used: Urination Pain - Female-A-AH

## 2022-11-14 NOTE — Telephone Encounter (Signed)
Noted  

## 2022-11-25 ENCOUNTER — Ambulatory Visit (HOSPITAL_COMMUNITY): Payer: Medicaid Other | Admitting: Licensed Clinical Social Worker

## 2022-11-27 ENCOUNTER — Encounter (HOSPITAL_COMMUNITY): Payer: Medicaid Other | Admitting: Student

## 2022-12-01 ENCOUNTER — Institutional Professional Consult (permissible substitution): Payer: Medicaid Other | Admitting: Neurology

## 2022-12-16 ENCOUNTER — Ambulatory Visit (HOSPITAL_COMMUNITY): Payer: Medicaid Other | Admitting: Licensed Clinical Social Worker

## 2022-12-22 ENCOUNTER — Encounter: Payer: Medicaid Other | Admitting: Family Medicine

## 2022-12-29 ENCOUNTER — Encounter: Payer: MEDICAID | Admitting: Family Medicine

## 2022-12-29 ENCOUNTER — Institutional Professional Consult (permissible substitution): Payer: Medicaid Other | Admitting: Neurology

## 2022-12-29 ENCOUNTER — Encounter: Payer: Medicaid Other | Admitting: Family Medicine

## 2023-01-06 ENCOUNTER — Ambulatory Visit (HOSPITAL_COMMUNITY): Payer: MEDICAID | Admitting: Student

## 2023-01-06 DIAGNOSIS — F339 Major depressive disorder, recurrent, unspecified: Secondary | ICD-10-CM | POA: Diagnosis not present

## 2023-01-06 DIAGNOSIS — Z0000 Encounter for general adult medical examination without abnormal findings: Secondary | ICD-10-CM

## 2023-01-06 DIAGNOSIS — F411 Generalized anxiety disorder: Secondary | ICD-10-CM | POA: Diagnosis not present

## 2023-01-06 DIAGNOSIS — F4310 Post-traumatic stress disorder, unspecified: Secondary | ICD-10-CM

## 2023-01-06 MED ORDER — FLUOXETINE HCL 20 MG PO CAPS: 20.0000 mg | ORAL_CAPSULE | Freq: Every day | ORAL | 1 refills | Status: DC

## 2023-01-06 MED ORDER — PRAZOSIN HCL 1 MG PO CAPS: 1.0000 mg | ORAL_CAPSULE | Freq: Every day | ORAL | 1 refills | Status: DC

## 2023-01-06 NOTE — Progress Notes (Signed)
BH MD/PA/NP OP Progress Note   01/06/2023 8:09 AM Archie Brancaccio  MRN:  [id number]  Chief Complaint:  Chief Complaint  Patient presents with   Follow-up   Stress   Depression   HPI:  Nyajah Minichiello is a 62 yr old female who presents for Follow Up and Medication Management.  PPHx is significant for MDD, Recurrent, Anxiety, PTSD and Insomnia, and no history of Suicide Attempts, Self Injurious Behavior, or Psychiatric Hospitalizations.     She believes medication not beneficial. She is not resting during the day and not sleeping at night. Sleeping approx 1 hour. She is not feeling joy.   She has been down and crying. Her appetite is variable. She has been isolating.   She is worried about her health. Her bleeding has returned. She is having clots.   She was concerned her medications have been ineffective. She stopped taking them a few days ago.   Therapy going well.   Denies uintentional weight loss.   Some passive SI. Kids and grandson are protective. Denies HI, AVH.   Palpitations, shortness of breath, denies nausea. Still some shortness of breath with walking.   Still hypervigilant, nightmares, flashbacks.   Still working on plant-based diet and attempting to increase exercise.   Denies cigarettes, alcohol, illicit drug use.     Visit Diagnosis:    ICD-10-CM   1. Major depression, recurrent, chronic (HCC)  F33.9     2. GAD (generalized anxiety disorder)  F41.1     3. PTSD (post-traumatic stress disorder)  F43.10     4. Encounter for health maintenance examination in adult  Z00.00          Past Psychiatric History: MDD, Recurrent, Anxiety, PTSD and Insomnia, and no history of Suicide Attempts, Self Injurious Behavior, or Psychiatric Hospitalizations.    Past Medical History:  Past Medical History:  Diagnosis Date   Anxiety    Depression    Hyperlipidemia    Hypertension    Ventricular tachycardia (HCC) 06/11/2020    Past Surgical History:  Procedure  Laterality Date   ABDOMINAL HYSTERECTOMY     CESAREAN SECTION      Family Psychiatric History: Maternal Cousin- Schizophrenia or Bipolar Disorder No known Substance Abuse or Suicides.  Family History:  Family History  Problem Relation Age of Onset   Stroke Mother    Dementia Mother     Social History:  Social History   Socioeconomic History   Marital status: Single    Spouse name: Not on file   Number of children: Not on file   Years of education: Not on file   Highest education level: Not on file  Occupational History   Not on file  Tobacco Use   Smoking status: Former    Types: Cigarettes   Smokeless tobacco: Never  Vaping Use   Vaping status: Never Used  Substance and Sexual Activity   Alcohol use: Not Currently   Drug use: Never   Sexual activity: Not Currently  Other Topics Concern   Not on file  Social History Narrative   Not on file   Social Drivers of Health   Financial Resource Strain: Low Risk  (02/12/2021)   Overall Financial Resource Strain (CARDIA)    Difficulty of Paying Living Expenses: Not hard at all  Food Insecurity: Food Insecurity Present (10/15/2022)   Hunger Vital Sign    Worried About Running Out of Food in the Last Year: Sometimes true    Ran Out of Food in  the Last Year: Sometimes true  Transportation Needs: Unmet Transportation Needs (10/15/2022)   PRAPARE - Transportation    Lack of Transportation (Medical): Yes    Lack of Transportation (Non-Medical): Yes  Physical Activity: Inactive (10/15/2022)   Exercise Vital Sign    Days of Exercise per Week: 0 days    Minutes of Exercise per Session: 0 min  Stress: Stress Concern Present (10/15/2022)   [employer] of Occupational Health - Occupational Stress Questionnaire    Feeling of Stress : Very much  Social Connections: Socially Isolated (10/15/2022)   Social Connection and Isolation Panel [NHANES]    Frequency of Communication with Friends and Family: Twice a week    Frequency of  Social Gatherings with Friends and Family: Once a week    Attends Religious Services: Never    Database administrator or Organizations: No    Attends Engineer, structural: Never    Marital Status: Never married    Allergies: No Known Allergies  Metabolic Disorder Labs: Lab Results  Component Value Date   HGBA1C 6.0 (H) 06/18/2022   No results found for: "PROLACTIN" Lab Results  Component Value Date   CHOL 192 10/26/2019   TRIG 332 (H) 10/26/2019   HDL <10 (L) 10/26/2019   CHOLHDL NOT CALCULATED 10/26/2019   VLDL 66 (H) 10/26/2019   LDLCALC NOT CALCULATED 10/26/2019   No results found for: "TSH"  Therapeutic Level Labs: No results found for: "LITHIUM" No results found for: "VALPROATE" No results found for: "CBMZ"  Current Medications: Current Outpatient Medications  Medication Sig Dispense Refill   FLUoxetine (PROZAC) 20 MG capsule Take 1 capsule (20 mg total) by mouth daily. 30 capsule 1   prazosin (MINIPRESS) 1 MG capsule Take 1 capsule (1 mg total) by mouth at bedtime. 30 capsule 1   amLODipine (NORVASC) 5 MG tablet Take 1 tablet (5 mg total) by mouth daily. 90 tablet 2   B Complex-C-Folic Acid (B COMPLEX-VITAMIN C-FOLIC ACID) 1 MG tablet Take 1 tablet by mouth daily with breakfast. (Patient not taking: Reported on 10/21/2022) 60 tablet 2   carvedilol (COREG) 12.5 MG tablet Take 1 tablet (12.5 mg total) by mouth 2 (two) times daily with a meal. 60 tablet 3   cefdinir (OMNICEF) 300 MG capsule Take 1 capsule (300 mg total) by mouth 2 (two) times daily. (Patient not taking: Reported on 10/21/2022) 10 capsule 0   famotidine (PEPCID) 20 MG tablet TAKE 1 TABLET(20 MG) BY MOUTH TWICE DAILY 120 tablet 4   No current facility-administered medications for this visit.     Musculoskeletal: Strength & Muscle Tone: within normal limits Gait & Station: normal Patient leans: N/A  Psychiatric Specialty Exam: Review of Systems  Respiratory:  Negative for shortness of  breath.   Cardiovascular:  Negative for chest pain.  Gastrointestinal:  Negative for abdominal pain, constipation, diarrhea, nausea and vomiting.  Genitourinary:  Positive for vaginal bleeding.  Neurological:  Negative for dizziness (occasional), weakness and headaches.  Psychiatric/Behavioral:  Positive for dysphoric mood. Negative for hallucinations and suicidal ideas. The patient is nervous/anxious.     There were no vitals taken for this visit.There is no height or weight on file to calculate BMI.  General Appearance: Could not assess  Eye Contact:  Could not assess  Speech:  Clear and Coherent and Normal Rate  Volume:  Normal  Mood:  Anxious and Depressed  Affect:  Congruent and Depressed  Thought Process:  Coherent and Goal Directed  Orientation:  Full (  Time, Place, and Person)  Thought Content: WDL and Logical   Suicidal Thoughts:  No active thoughts; some passive thoughts but lacks desire to act on them  Homicidal Thoughts:  No  Memory:  Immediate;   Good Recent;   Good  Judgement:  Fair  Insight:  Fair and Shallow  Psychomotor Activity: Normal  Concentration:  Concentration: Good and Attention Span: Good  Recall:  Good  Fund of Knowledge: Good  Language: Good  Akathisia:  Negative  Handed:  Right  AIMS (if indicated): not done  Assets:  Communication Skills Desire for Improvement Housing Leisure Time Resilience Social Support  ADL's:  Intact  Cognition: WNL  Sleep:  Poor   Screenings: GAD-7    Advertising copywriter from 10/15/2022 in [facility] Office Visit from 06/18/2022 in [facility] [place]. [facility] Counselor from 10/09/2021 in [facility] Counselor from 09/10/2021 in [facility] Counselor from 06/19/2021 in [facility]  Total GAD-7 Score 19 21 18 14 19       [phone]    Flowsheet Row Counselor  from 10/15/2022 in [facility] Office Visit from 06/18/2022 in [facility] [place]. [facility] Counselor from 10/09/2021 in [facility] Counselor from 09/10/2021 in [facility] Counselor from 06/19/2021 in [facility]  PHQ-2 Total Score 4 2 3 6 3   PHQ-9 Total Score 15 18 17 19 20       Flowsheet Row Counselor from 10/15/2022 in [facility] Counselor from 10/09/2021 in [facility] Counselor from 09/10/2021 in [facility]  C-SSRS RISK CATEGORY Low Risk Low Risk Low Risk        Assessment and Plan:  Aadya Vaness is a 62 yr old female who presents for Follow Up and Medication Management.  PPHx is significant for MDD, Recurrent, Anxiety, PTSD, Grief, and Insomnia, and no history of Suicide Attempts, Self Injurious Behavior, or Psychiatric Hospitalizations.     Elyzabeth is tearful on assessment today, noting concerns about her physical health, as she has begun to experience vaginal bleeding.  She is worried about recurrence of her cervical cancer.  She does not yet have an appointment with her OB/GYN, as the most recent 1 was canceled due to URI.  Patient today reports worsening of her depressive symptoms and anxiety.  She likes the desire to be around anyone, including family.  As well, her nightmares, flashbacks, and hypervigilance have been worsening.  She reports previous positive response to Prozac.  As it appears that she is not able to sleep so well due to racing thoughts about traumatic events and nightmares, would opt to add Prozac at this time.  Patient noticed futility of her medications, so she discontinued them herself a few days ago.  As Seroquel was not the best option for this patient, Remeron dosage was maximized, and patient with previous positive  response to an antidepressant other than Cymbalta, in agreement with the plan to discontinue at this time.  We will restart Prozac with the addition of prazosin for her significant symptoms of PTSD.  As patient raised concerns about her vaginal bleeding, this writer reached out to Dr. Alvester Morin to get an appointment scheduled.  This Clinical research associate also reached out to [employer], patient's new PCP  with the retirement of Dr. Delford Field, to ensure that labs are drawn during her next PCP visit in January: TSH, CBC, vitamin D, and CMP, in addition to other labs that may be drawn.   MDD, Recurrent, Chronic  PTSD  GAD  Insomnia : -Will formally discontinue Remeron, Seroquel, and Cymbalta, as patient has not been taking medications.  - START Prozac 20 mg for depression and anxiety - START Prazosin 1 mg for anxiety 2/2 trauma  #Suspected OSA - Referral for sleep study placed and pending  Collaboration of Care: Collaboration of Care:Dr. Bahraini; Zollie Pee, NP; OB/GYN-Dr. Alvester Morin  Patient/Guardian was advised Release of Information must be obtained prior to any record release in order to collaborate their care with an outside provider. Patient/Guardian was advised if they have not already done so to contact the registration department to sign all necessary forms in order for [place] to release information regarding their care.   Consent: Patient/Guardian gives verbal consent for treatment and assignment of benefits for services provided during this visit. Patient/Guardian expressed understanding and agreed to proceed.    Lamar Sprinkles, MD 01/06/2023 8:09 AM

## 2023-01-07 ENCOUNTER — Encounter: Payer: Self-pay | Admitting: Family Medicine

## 2023-01-24 ENCOUNTER — Ambulatory Visit
Admission: EM | Admit: 2023-01-24 | Discharge: 2023-01-24 | Disposition: A | Discharge status: Home / Self Care | Payer: MEDICAID | Attending: Internal Medicine | Admitting: Internal Medicine

## 2023-01-24 DIAGNOSIS — N3000 Acute cystitis without hematuria: Secondary | ICD-10-CM | POA: Diagnosis present

## 2023-01-24 LAB — POCT URINALYSIS DIP (MANUAL ENTRY)
Bilirubin, UA: NEGATIVE
Glucose, UA: NEGATIVE mg/dL
Ketones, POC UA: NEGATIVE mg/dL
Nitrite, UA: POSITIVE — AB
Spec Grav, UA: 1.025 (ref 1.010–1.025)
Urobilinogen, UA: 4 U/dL — AB
pH, UA: 7 (ref 5.0–8.0)

## 2023-01-24 MED ORDER — CIPROFLOXACIN HCL 500 MG PO TABS: 500.0000 mg | ORAL_TABLET | Freq: Two times a day (BID) | ORAL | 0 refills | Status: DC

## 2023-01-24 NOTE — ED Triage Notes (Signed)
"  I am having pain/pressure at the end of peeing and a bad odor". First noticed "about 2 days ago". No fever.

## 2023-01-24 NOTE — ED Provider Notes (Signed)
 [name]    CSN: [id number] Arrival date & time: 01/24/23  0802      History   Chief Complaint Chief Complaint  Patient presents with   UTI Symptoms    HPI Lydia Vasquez is a 63 y.o. female.   Patient here today for evaluation of possible UTI.  She reports that she has dysuria and pressure specifically in the end of urination and has noticed a bad odor.  Symptoms initially noticed 2 days ago.  She has not any fever.  She denies any abdominal pain or back pain.  She has not any nausea or vomiting.  The history is provided by the patient.    Past Medical History:  Diagnosis Date   Anxiety    Depression    Hyperlipidemia    Hypertension    Ventricular tachycardia (HCC) 06/11/2020    Patient Active Problem List   Diagnosis Date Noted   Alcoholic peripheral neuropathy (HCC) 06/19/2022   HTN (hypertension) 06/19/2022   Dysuria 06/19/2022   Colon cancer screening 06/19/2022   Mass of left axilla 06/19/2022   Prediabetes 06/19/2022   PTSD (post-traumatic stress disorder) 02/12/2021   Palpitations 06/29/2020   Alcoholic cirrhosis of liver without ascites (HCC) 06/11/2020   Major depression, recurrent, chronic (HCC) 06/11/2020   History of alcohol abuse 06/11/2020   BMI 45.0-49.9, adult (HCC) 06/11/2020   History of cervical cancer 06/11/2020    Past Surgical History:  Procedure Laterality Date   ABDOMINAL HYSTERECTOMY     CESAREAN SECTION      OB History   No obstetric history on file.      Home Medications    Prior to Admission medications   Medication Sig Start Date End Date Taking? Authorizing Provider  ciprofloxacin  (CIPRO ) 500 MG tablet Take 1 tablet (500 mg total) by mouth every 12 (twelve) hours. 01/24/23  Yes Billy Asberry FALCON, PA-C  amLODipine  (NORVASC ) 5 MG tablet Take 1 tablet (5 mg total) by mouth daily. 06/18/22   Brien Belvie BRAVO, MD  B Complex-C-Folic Acid (B COMPLEX-VITAMIN C-FOLIC ACID) 1 MG tablet Take 1 tablet by mouth daily  with breakfast. Patient not taking: Reported on 10/21/2022 06/18/22   Brien Belvie BRAVO, MD  carvedilol  (COREG ) 12.5 MG tablet Take 1 tablet (12.5 mg total) by mouth 2 (two) times daily with a meal. 06/18/22   Brien Belvie BRAVO, MD  cefdinir  (OMNICEF ) 300 MG capsule Take 1 capsule (300 mg total) by mouth 2 (two) times daily. Patient not taking: Reported on 10/21/2022 06/23/22   Brien Belvie BRAVO, MD  famotidine  (PEPCID ) 20 MG tablet TAKE 1 TABLET(20 MG) BY MOUTH TWICE DAILY 06/18/22   Brien Belvie BRAVO, MD  FLUoxetine  (PROZAC ) 20 MG capsule Take 1 capsule (20 mg total) by mouth daily. 01/06/23 03/07/23  Bahraini, Sarah A  prazosin  (MINIPRESS ) 1 MG capsule Take 1 capsule (1 mg total) by mouth at bedtime. 01/06/23 03/07/23  Mercy Lauraine LABOR    Family History Family History  Problem Relation Age of Onset   Stroke Mother    Dementia Mother     Social History Social History   Tobacco Use   Smoking status: Former    Types: Cigarettes    Passive exposure: Past   Smokeless tobacco: Never  Vaping Use   Vaping status: Never Used  Substance Use Topics   Alcohol use: Not Currently   Drug use: Never     Allergies   Patient has no known allergies.   Review of Systems Review  of Systems  Constitutional:  Negative for chills and fever.  Eyes:  Negative for discharge and redness.  Respiratory:  Negative for shortness of breath.   Gastrointestinal:  Negative for abdominal pain, nausea and vomiting.  Genitourinary:  Positive for dysuria and frequency.  Musculoskeletal:  Negative for back pain.     Physical Exam Triage Vital Signs ED Triage Vitals  Encounter Vitals Group     BP 01/24/23 0838 (!) 167/92     Systolic BP Percentile --      Diastolic BP Percentile --      Pulse Rate 01/24/23 0838 96     Resp 01/24/23 0838 20     Temp 01/24/23 0838 98 F (36.7 C)     Temp Source 01/24/23 0838 Oral     SpO2 01/24/23 0838 97 %     Weight 01/24/23 0837 296 lb (134.3 kg)     Height 01/24/23  0837 5' 11 (1.803 m)     Head Circumference --      Peak Flow --      Pain Score 01/24/23 0836 0     Pain Loc --      Pain Education --      Exclude from Growth Chart --    No data found.  Updated Vital Signs BP (!) 167/92 (BP Location: Left Arm) Comment: I didn't take my BP medicine this morning, I was rushing. Will recheck later during visit or after.  Pulse 96   Temp 98 F (36.7 C) (Oral)   Resp 20   Ht 5' 11 (1.803 m)   Wt 296 lb (134.3 kg)   SpO2 97%   BMI 41.28 kg/m   Visual Acuity Right Eye Distance:   Left Eye Distance:   Bilateral Distance:    Right Eye Near:   Left Eye Near:    Bilateral Near:     Physical Exam Vitals and nursing note reviewed.  Constitutional:      General: She is not in acute distress.    Appearance: Normal appearance. She is not ill-appearing.  HENT:     Head: Normocephalic and atraumatic.  Eyes:     Conjunctiva/sclera: Conjunctivae normal.  Cardiovascular:     Rate and Rhythm: Normal rate.  Pulmonary:     Effort: Pulmonary effort is normal. No respiratory distress.  Neurological:     Mental Status: She is alert.  Psychiatric:        Mood and Affect: Mood normal.        Behavior: Behavior normal.        Thought Content: Thought content normal.      UC Treatments / Results  Labs (all labs ordered are listed, but only abnormal results are displayed) Labs Reviewed  POCT URINALYSIS DIP (MANUAL ENTRY) - Abnormal; Notable for the following components:      Result Value   Color, UA other (*)    Clarity, UA cloudy (*)    Blood, UA small (*)    Protein Ur, POC trace (*)    Urobilinogen, UA 4.0 (*)    Nitrite, UA Positive (*)    Leukocytes, UA Small (1+) (*)    All other components within normal limits  URINE CULTURE    EKG   Radiology No results found.  Procedures Procedures (including critical care time)  Medications Ordered in UC Medications - No data to display  Initial Impression / Assessment and Plan / UC  Course  I have reviewed the triage vital signs and  the nursing notes.  Pertinent labs & imaging results that were available during my care of the patient were reviewed by me and considered in my medical decision making (see chart for details).     UA consistent with UTI.  Cipro  prescribed.  Urine culture ordered.  Recommended follow-up if no gradual improvement with any further concerns.   Final Clinical Impressions(s) / UC Diagnoses   Final diagnoses:  Acute cystitis without hematuria   Discharge Instructions   None    ED Prescriptions     Medication Sig Dispense Auth. Provider   ciprofloxacin  (CIPRO ) 500 MG tablet Take 1 tablet (500 mg total) by mouth every 12 (twelve) hours. 10 tablet Billy Asberry FALCON, PA-C      PDMP not reviewed this encounter.   Billy Asberry FALCON, PA-C 01/24/23 [phone]

## 2023-01-26 LAB — URINE CULTURE: Culture: >=100000 — AB

## 2023-01-27 ENCOUNTER — Ambulatory Visit: Payer: MEDICAID | Admitting: Nurse Practitioner

## 2023-01-27 ENCOUNTER — Ambulatory Visit: Payer: Medicaid Other | Admitting: Critical Care Medicine

## 2023-02-05 ENCOUNTER — Encounter (HOSPITAL_COMMUNITY): Payer: MEDICAID | Admitting: Student

## 2023-02-11 ENCOUNTER — Ambulatory Visit (HOSPITAL_COMMUNITY): Payer: MEDICAID | Admitting: Licensed Clinical Social Worker

## 2023-02-19 ENCOUNTER — Ambulatory Visit: Payer: MEDICAID | Admitting: Physician Assistant

## 2023-03-05 ENCOUNTER — Encounter (HOSPITAL_COMMUNITY): Payer: MEDICAID | Admitting: Student

## 2023-03-11 ENCOUNTER — Ambulatory Visit: Payer: MEDICAID | Admitting: Physician Assistant

## 2023-03-19 ENCOUNTER — Ambulatory Visit (INDEPENDENT_AMBULATORY_CARE_PROVIDER_SITE_OTHER): Payer: MEDICAID | Admitting: Student

## 2023-03-19 ENCOUNTER — Encounter (HOSPITAL_COMMUNITY): Payer: Self-pay | Admitting: Student

## 2023-03-19 VITALS — BP 129/80 | HR 70 | Ht 71.0 in | Wt 299.2 lb

## 2023-03-19 DIAGNOSIS — F4310 Post-traumatic stress disorder, unspecified: Secondary | ICD-10-CM

## 2023-03-19 DIAGNOSIS — F411 Generalized anxiety disorder: Secondary | ICD-10-CM | POA: Insufficient documentation

## 2023-03-19 DIAGNOSIS — N939 Abnormal uterine and vaginal bleeding, unspecified: Secondary | ICD-10-CM | POA: Diagnosis not present

## 2023-03-19 DIAGNOSIS — F339 Major depressive disorder, recurrent, unspecified: Secondary | ICD-10-CM | POA: Diagnosis not present

## 2023-03-19 DIAGNOSIS — F1021 Alcohol dependence, in remission: Secondary | ICD-10-CM

## 2023-03-19 MED ORDER — FLUOXETINE HCL 40 MG PO CAPS: 40.0000 mg | ORAL_CAPSULE | Freq: Every day | ORAL | 1 refills | Status: DC

## 2023-03-19 MED ORDER — PRAZOSIN HCL 2 MG PO CAPS: 2.0000 mg | ORAL_CAPSULE | Freq: Every day | ORAL | 1 refills | Status: DC

## 2023-03-19 NOTE — Progress Notes (Signed)
 BH MD/PA/NP OP Progress Note   03/19/2023 4:24 PM  Lydia Vasquez  MRN:  [id number]  Chief Complaint:  Chief Complaint  Patient presents with   Depression   Anxiety   Stress   Medication Refill   Follow-up   HPI:  Lydia Vasquez is a 63 yr old female who presents for Follow Up and Medication Management.  PPHx is significant for MDD, Recurrent, Anxiety, PTSD and Insomnia, and no history of Suicide Attempts, Self Injurious Behavior, or Psychiatric Hospitalizations.     Reports sleep has worsened, partticularly over past 2 weeks. She has been worried about son in his relationship. She is also worried about her own health, as she has not yet been able to be seen by OB/GYN or her new PCP.   She is medication compliant but still believes medication not beneficial.  Her mood remains low with crying spells, her energy low, and she is anhedonic.  Today is her birthday, and her children have discussed taking her out for dinner, but she does not feel the desire.  Her sleep remains poor.  Sleeping approx 1 hour. She is not feeling joy.   Some passive SI. Kids and grandson are protective. Denies HI, AVH.   Denies cigarettes, alcohol, illicit drug use.     Visit Diagnosis:    ICD-10-CM   1. Major depression, recurrent, chronic (HCC)  F33.9     2. Vaginal bleeding  N93.9 Ambulatory referral to Obstetrics / Gynecology    3. GAD (generalized anxiety disorder)  F41.1     4. PTSD (post-traumatic stress disorder)  F43.10           Past Psychiatric History: MDD, Recurrent, Anxiety, PTSD and Insomnia, and no history of Suicide Attempts, Self Injurious Behavior, or Psychiatric Hospitalizations.    Past Medical History:  Past Medical History:  Diagnosis Date   Anxiety    Depression    Hyperlipidemia    Hypertension    Ventricular tachycardia (HCC) 06/11/2020    Past Surgical History:  Procedure Laterality Date   ABDOMINAL HYSTERECTOMY     CESAREAN SECTION      Family Psychiatric  History: Maternal Cousin- Schizophrenia or Bipolar Disorder No known Substance Abuse or Suicides.  Family History:  Family History  Problem Relation Age of Onset   Stroke Mother    Dementia Mother     Social History:  Social History   Socioeconomic History   Marital status: Single    Spouse name: Not on file   Number of children: Not on file   Years of education: Not on file   Highest education level: Not on file  Occupational History   Not on file  Tobacco Use   Smoking status: Former    Types: Cigarettes    Passive exposure: Past   Smokeless tobacco: Never  Vaping Use   Vaping status: Never Used  Substance and Sexual Activity   Alcohol use: Not Currently   Drug use: Never   Sexual activity: Not Currently  Other Topics Concern   Not on file  Social History Narrative   Not on file   Social Drivers of Health   Financial Resource Strain: Low Risk  (02/12/2021)   Overall Financial Resource Strain (CARDIA)    Difficulty of Paying Living Expenses: Not hard at all  Food Insecurity: Food Insecurity Present (10/15/2022)   Hunger Vital Sign    Worried About Running Out of Food in the Last Year: Sometimes true    Ran Out of Food  in the Last Year: Sometimes true  Transportation Needs: Unmet Transportation Needs (10/15/2022)   PRAPARE - Transportation    Lack of Transportation (Medical): Yes    Lack of Transportation (Non-Medical): Yes  Physical Activity: Inactive (10/15/2022)   Exercise Vital Sign    Days of Exercise per Week: 0 days    Minutes of Exercise per Session: 0 min  Stress: Stress Concern Present (10/15/2022)   [employer] of Occupational Health - Occupational Stress Questionnaire    Feeling of Stress : Very much  Social Connections: Socially Isolated (10/15/2022)   Social Connection and Isolation Panel [NHANES]    Frequency of Communication with Friends and Family: Twice a week    Frequency of Social Gatherings with Friends and Family: Once a week     Attends Religious Services: Never    Database administrator or Organizations: No    Attends Engineer, structural: Never    Marital Status: Never married    Allergies: No Known Allergies  Metabolic Disorder Labs: Lab Results  Component Value Date   HGBA1C 6.0 (H) 06/18/2022   No results found for: "PROLACTIN" Lab Results  Component Value Date   CHOL 192 10/26/2019   TRIG 332 (H) 10/26/2019   HDL <10 (L) 10/26/2019   CHOLHDL NOT CALCULATED 10/26/2019   VLDL 66 (H) 10/26/2019   LDLCALC NOT CALCULATED 10/26/2019   No results found for: "TSH"  Therapeutic Level Labs: No results found for: "LITHIUM" No results found for: "VALPROATE" No results found for: "CBMZ"  Current Medications: Current Outpatient Medications  Medication Sig Dispense Refill   amLODipine (NORVASC) 5 MG tablet Take 1 tablet (5 mg total) by mouth daily. 90 tablet 2   B Complex-C-Folic Acid (B COMPLEX-VITAMIN C-FOLIC ACID) 1 MG tablet Take 1 tablet by mouth daily with breakfast. (Patient not taking: Reported on 10/21/2022) 60 tablet 2   carvedilol (COREG) 12.5 MG tablet Take 1 tablet (12.5 mg total) by mouth 2 (two) times daily with a meal. 60 tablet 3   cefdinir (OMNICEF) 300 MG capsule Take 1 capsule (300 mg total) by mouth 2 (two) times daily. (Patient not taking: Reported on 10/21/2022) 10 capsule 0   ciprofloxacin (CIPRO) 500 MG tablet Take 1 tablet (500 mg total) by mouth every 12 (twelve) hours. 10 tablet 0   famotidine (PEPCID) 20 MG tablet TAKE 1 TABLET(20 MG) BY MOUTH TWICE DAILY 120 tablet 4   FLUoxetine (PROZAC) 40 MG capsule Take 1 capsule (40 mg total) by mouth daily. 30 capsule 1   prazosin (MINIPRESS) 2 MG capsule Take 1 capsule (2 mg total) by mouth at bedtime. 30 capsule 1   No current facility-administered medications for this visit.     Musculoskeletal: Strength & Muscle Tone: within normal limits Gait & Station: normal Patient leans: N/A  Psychiatric Specialty Exam: Review of  Systems  Respiratory:  Negative for shortness of breath.   Cardiovascular:  Negative for chest pain.  Gastrointestinal:  Negative for abdominal pain, constipation, diarrhea, nausea and vomiting.  Genitourinary:  Positive for vaginal bleeding.  Neurological:  Negative for dizziness (occasional), weakness and headaches.  Psychiatric/Behavioral:  Positive for dysphoric mood. Negative for hallucinations and suicidal ideas. The patient is nervous/anxious.     Blood pressure 129/80, pulse 70, height 5\' 11"  (1.803 m), weight 299 lb 3.2 oz (135.7 kg), SpO2 95%.Body mass index is 41.73 kg/m.  General Appearance: Casual  Eye Contact: Good  Speech:  Clear and Coherent and Normal Rate  Volume:  Normal  Mood:  Anxious and Depressed  Affect:  Congruent and Depressed  Thought Process:  Coherent and Goal Directed  Orientation:  Full (Time, Place, and Person)  Thought Content: WDL and Logical   Suicidal Thoughts:  No active thoughts; some passive thoughts but lacks desire to act on them  Homicidal Thoughts:  No  Memory:  Immediate;   Good Recent;   Good  Judgement:  Fair  Insight:  Fair and Shallow  Psychomotor Activity: Normal  Concentration:  Concentration: Good and Attention Span: Good  Recall:  Good  Fund of Knowledge: Good  Language: Good  Akathisia:  Negative  Handed:  Right  AIMS (if indicated): not done  Assets:  Communication Skills Desire for Improvement Housing Leisure Time Resilience Social Support  ADL's:  Intact  Cognition: WNL  Sleep:  Poor   Screenings: GAD-7    Advertising copywriter from 10/15/2022 in [facility] Office Visit from 06/18/2022 in [facility] [place] Counselor from 10/09/2021 in [facility] Counselor from 09/10/2021 in [facility] Counselor from 06/19/2021 in [facility]  Total GAD-7 Score  19 21 18 14 19       [phone]    Flowsheet Row Counselor from 10/15/2022 in [facility] Office Visit from 06/18/2022 in [facility] [place] Counselor from 10/09/2021 in [facility] Counselor from 09/10/2021 in [facility] Counselor from 06/19/2021 in [facility]  PHQ-2 Total Score 4 2 3 6 3   PHQ-9 Total Score 15 18 17 19 20       Flowsheet Row ED from 01/24/2023 in [facility] Counselor from 10/15/2022 in [facility] Counselor from 10/09/2021 in [facility]  C-SSRS RISK CATEGORY No Risk Low Risk Low Risk        Assessment and Plan:  Romaine Maciolek is a 63 yr old female who presents for Follow Up and Medication Management.  PPHx is significant for MDD, Recurrent, Anxiety, PTSD, Grief, and Insomnia, and no history of Suicide Attempts, Self Injurious Behavior, or Psychiatric Hospitalizations.     Fatma is again tearful on assessment today, noting concerns about her physical health and worries about her family.  She has not had no improvements in her mood nor anxiety.  She has not been experiencing joy and continues to have low mood.  Although today is her birthday, she remains anhedonic.  She is not yet been able to follow up with PCP, and OB/GYN has not reached out to her.   Her nightmares, flashbacks, and hypervigilance have also remained heightened.  She has not yet noted a response to Prozac nor prazosin.  We will continue to titrate medications.  Due to the patient's concerns about her vaginal bleeding, this writer reached out to Dr. Alvester Morin to get an appointment scheduled.  Dr. Alvester Morin advised that she was not an established patient of the clinic.  We will place another referral for OB/GYN at this time.  This Clinical research associate also to reach out  to [employer], patient's new PCP with the retirement of Dr. Delford Field, to ensure that labs are drawn during her next PCP visit in March: TSH, CBC, vitamin D, and CMP, in addition to other labs that may be drawn.  MDD, Recurrent, Chronic  PTSD  GAD  Insomnia : -Increase to Prozac 40 mg for depression and anxiety -Increase to prazosin 2 mg for anxiety 2/2 trauma  #Suspected OSA - Referral for sleep study placed and pending  #Vaginal bleeding -Referral placed for OB/GYN  #Alcohol use disorder, in sustained remission -Patient maintaining sobriety  Collaboration of Care: Collaboration of Care:Dr. [place]i; [place], [place]  Patient/Guardian was advised Release of Information must be obtained prior to any record release in order to collaborate their care with an outside provider. Patient/Guardian was advised if they have not already done so to contact the registration department to sign all necessary forms in order for [place] to release information regarding their care.   Consent: Patient/Guardian gives verbal consent for treatment and assignment of benefits for services provided during this visit. Patient/Guardian expressed understanding and agreed to proceed.    Lamar Sprinkles, MD 03/19/2023 4:24 PM

## 2023-03-24 ENCOUNTER — Ambulatory Visit (HOSPITAL_COMMUNITY): Payer: MEDICAID | Admitting: Licensed Clinical Social Worker

## 2023-03-26 ENCOUNTER — Telehealth (HOSPITAL_BASED_OUTPATIENT_CLINIC_OR_DEPARTMENT_OTHER): Payer: MEDICAID | Admitting: Physician Assistant

## 2023-03-26 ENCOUNTER — Encounter: Payer: Self-pay | Admitting: Physician Assistant

## 2023-03-26 ENCOUNTER — Ambulatory Visit: Payer: MEDICAID | Attending: Physician Assistant | Admitting: Physician Assistant

## 2023-03-26 ENCOUNTER — Telehealth: Payer: Self-pay | Admitting: Licensed Clinical Social Worker

## 2023-03-26 VITALS — BP 100/70 | HR 74 | Wt 299.0 lb

## 2023-03-26 DIAGNOSIS — F339 Major depressive disorder, recurrent, unspecified: Secondary | ICD-10-CM

## 2023-03-26 DIAGNOSIS — R002 Palpitations: Secondary | ICD-10-CM

## 2023-03-26 DIAGNOSIS — R202 Paresthesia of skin: Secondary | ICD-10-CM

## 2023-03-26 DIAGNOSIS — R7303 Prediabetes: Secondary | ICD-10-CM

## 2023-03-26 DIAGNOSIS — R1013 Epigastric pain: Secondary | ICD-10-CM

## 2023-03-26 DIAGNOSIS — I10 Essential (primary) hypertension: Secondary | ICD-10-CM | POA: Diagnosis not present

## 2023-03-26 DIAGNOSIS — Z1331 Encounter for screening for depression: Secondary | ICD-10-CM

## 2023-03-26 DIAGNOSIS — F411 Generalized anxiety disorder: Secondary | ICD-10-CM

## 2023-03-26 DIAGNOSIS — N939 Abnormal uterine and vaginal bleeding, unspecified: Secondary | ICD-10-CM

## 2023-03-26 DIAGNOSIS — F1011 Alcohol abuse, in remission: Secondary | ICD-10-CM | POA: Diagnosis not present

## 2023-03-26 DIAGNOSIS — F32A Depression, unspecified: Secondary | ICD-10-CM

## 2023-03-26 DIAGNOSIS — Z8541 Personal history of malignant neoplasm of cervix uteri: Secondary | ICD-10-CM

## 2023-03-26 MED ORDER — BLOOD PRESSURE CUFF MISC
1.0000 | Freq: Every day | 0 refills | Status: AC
Start: 2023-03-26 — End: ?

## 2023-03-26 MED ORDER — NEPHRO-VITE RX 1 MG PO TABS
1.0000 | ORAL_TABLET | Freq: Every day | ORAL | 2 refills | Status: AC
Start: 2023-03-26 — End: ?

## 2023-03-26 MED ORDER — CARVEDILOL 6.25 MG PO TABS
6.2500 mg | ORAL_TABLET | Freq: Two times a day (BID) | ORAL | 3 refills | Status: DC
Start: 2023-03-26 — End: 2023-10-13

## 2023-03-26 MED ORDER — FAMOTIDINE 20 MG PO TABS
ORAL_TABLET | ORAL | 4 refills | Status: DC
Start: 2023-03-26 — End: 2023-10-13

## 2023-03-26 NOTE — Patient Instructions (Signed)
 Check your blood pressures several times a week and record.  Bring these numbers to your next visit

## 2023-03-26 NOTE — Telephone Encounter (Signed)
 Please see my note.  +depression screening PHQ9 #9=1 and +GAD7.  Seen at [facility].  I would like for you to touch base for any needs she may have bt her BH appts

## 2023-03-26 NOTE — Progress Notes (Signed)
 Patient ID: Lydia Vasquez, female   DOB: 11/18/1960, 63 y.o.   MRN: [id number]   Quincee Gittens, is a 63 y.o. female  [id number]  [id number]  DOB - October 20, 1960  Chief Complaint  Patient presents with   Hypertension   Medication Refill       Subjective:   Lydia Vasquez is a 63 y.o. female here today for med RF.  She is also having vaginal bleeding periodically but has had hysterectomy many years ago.  She says this may happen once every few months and lasts for a day or so.  Sometimes after she tries to have intercourse.  No N/V/D.  Appetite is good.    She is still/has always struggled with depression.  She is followed by behavioral health  most recently by [employer].  She does have passive SI.  She has a 80 year old grandson, 2 daughters, and a son that are local.  She also has a sister that is here she likes to spend time with.  She perseverates about things that have happened in the past/traumas, etc.  She also worries that something is wrong with her.  No plan.  No intent.    She has not been taking amlodipine for a while.  Prazosin was added for PTSD.  BP is low today.  She is asymptomatic.  No dizziness.  She has been taking carvedilol. No problems updated.  ALLERGIES: No Known Allergies  PAST MEDICAL HISTORY: Past Medical History:  Diagnosis Date   Anxiety    Depression    Hyperlipidemia    Hypertension    Ventricular tachycardia (HCC) 06/11/2020    MEDICATIONS AT HOME: Prior to Admission medications   Medication Sig Start Date End Date Taking? Authorizing Provider  Blood Pressure Monitoring (BLOOD PRESSURE CUFF) MISC 1 each by Does not apply route daily. 03/26/23  Yes Georgian Co M, PA-C  carvedilol (COREG) 6.25 MG tablet Take 1 tablet (6.25 mg total) by mouth 2 (two) times daily with a meal. 03/26/23  Yes Brihana Quickel M, PA-C  FLUoxetine (PROZAC) 40 MG capsule Take 1 capsule (40 mg total) by mouth daily. 03/19/23 05/18/23 Yes Cosby, Toni Amend, MD  prazosin  (MINIPRESS) 2 MG capsule Take 1 capsule (2 mg total) by mouth at bedtime. 03/19/23 05/18/23 Yes Lamar Sprinkles, MD  B Complex-C-Folic Acid (B COMPLEX-VITAMIN C-FOLIC ACID) 1 MG tablet Take 1 tablet by mouth daily with breakfast. 03/26/23   Anders Simmonds, PA-C  famotidine (PEPCID) 20 MG tablet TAKE 1 TABLET(20 MG) BY MOUTH TWICE DAILY 03/26/23   Mardi Cannady, Marzella Schlein, PA-C    ROS: Neg HEENT Neg resp Neg cardiac Neg MS Neg psych Neg neuro  Objective:   Vitals:   03/26/23 0841 03/26/23 0858  BP: (!) 85/60 100/70  Pulse: 74   SpO2: 95%   Weight: 299 lb (135.6 kg)    Exam General appearance : Awake, alert, not in any distress. Speech Clear. Not toxic looking HEENT: Atraumatic and Normocephalic Neck: Supple, no JVD. No cervical lymphadenopathy.  Chest: Good air entry bilaterally, CTAB.  No rales/rhonchi/wheezing CVS: S1 S2 regular, no murmurs.  Extremities: B/L Lower Ext shows no edema, both legs are warm to touch Neurology: Awake alert, and oriented X 3, CN II-XII intact, Non focal Skin: No Rash Psych-flat affect.  Mood is stable.  TP linear.  J/I fair  Data Review Lab Results  Component Value Date   HGBA1C 6.0 (H) 06/18/2022    Assessment & Plan   1. Primary hypertension (Primary)  With addition of prazosin, will not resume amlodipine and will decrease dose of carvedilol.  Check BP several times a week and record and bring to next visit - Comprehensive metabolic panel - carvedilol (COREG) 6.25 MG tablet; Take 1 tablet (6.25 mg total) by mouth 2 (two) times daily with a meal.  Dispense: 60 tablet; Refill: 3 - Blood Pressure Monitoring (BLOOD PRESSURE CUFF) MISC; 1 each by Does not apply route daily.  Dispense: 1 each; Refill: 0  2. Palpitations - carvedilol (COREG) 6.25 MG tablet; Take 1 tablet (6.25 mg total) by mouth 2 (two) times daily with a meal.  Dispense: 60 tablet; Refill: 3  3. Major depression, recurrent, chronic (HCC) She is increasing the dose of prozac today(dose  increased 2/27 but has not picked up higher dose of prozac until today - TSH - Vitamin D, 25-hydroxy - B Complex-C-Folic Acid (B COMPLEX-VITAMIN C-FOLIC ACID) 1 MG tablet; Take 1 tablet by mouth daily with breakfast.  Dispense: 60 tablet; Refill: 2  4. History of alcohol abuse Has not drank in about 2 years!!! Renee Rival!  5. Prediabetes work at a goal of eliminating sugary drinks, candy, desserts, sweets, refined sugars, processed foods, and white carbohydrates. - Hemoglobin A1c  6. Vaginal bleeding - CBC with Differential - Ambulatory referral to Gynecology  7. History of cervical cancer - Ambulatory referral to Gynecology  8. GAD (generalized anxiety disorder) Continue seeing BH-she has an appt 3/19.  I will have Reginia Naas LCSW reach out as well  9. Paresthesias in legs - TSH - Vitamin D, 25-hydroxy - B12 and Folate Panel  10. Dyspepsia - CBC with Differential - B12 and Folate Panel - famotidine (PEPCID) 20 MG tablet; TAKE 1 TABLET(20 MG) BY MOUTH TWICE DAILY  Dispense: 120 tablet; Refill: 4  11. Positive depression screening See #8 and #3. Counseled on self-care, keeping in the day, exploring positive hobbies, mild exercise    Return in about 2 months (around 05/26/2023) for please assgn to a PCP here!!!.  The patient was given clear instructions to go to ER or return to medical center if symptoms don't improve, worsen or new problems develop. The patient verbalized understanding. The patient was told to call to get lab results if they haven't heard anything in the next week.      Georgian Co, PA-C [facility] [place], [place] [phone]   03/26/2023, 9:11 AM

## 2023-03-26 NOTE — Telephone Encounter (Signed)
 LCSWA Intern called patient today to introduce herself and to assess patients' mental health needs. Patient did answer the phone. LCSWA Intern was able to speak with patient about her depression and anxiety.  Patient stated that she is already connected with a therapist and a psychiatrist.  Patient stated that if she needs any other resources she will reach out via my chart or to her provider.  Patient was referred by PCP for depression and anxiety.

## 2023-03-27 ENCOUNTER — Other Ambulatory Visit: Payer: Self-pay | Admitting: Physician Assistant

## 2023-03-27 DIAGNOSIS — R748 Abnormal levels of other serum enzymes: Secondary | ICD-10-CM

## 2023-03-27 DIAGNOSIS — Z8719 Personal history of other diseases of the digestive system: Secondary | ICD-10-CM

## 2023-03-27 LAB — COMPREHENSIVE METABOLIC PANEL
ALT: 22 IU/L (ref 0–32)
AST: 46 IU/L — ABNORMAL HIGH (ref 0–40)
Albumin: 3.9 g/dL (ref 3.9–4.9)
Alkaline Phosphatase: 190 IU/L — ABNORMAL HIGH (ref 44–121)
BUN/Creatinine Ratio: 17 (ref 12–28)
BUN: 13 mg/dL (ref 8–27)
Bilirubin Total: 1.6 mg/dL — ABNORMAL HIGH (ref 0.0–1.2)
CO2: 22 mmol/L (ref 20–29)
Calcium: 9.1 mg/dL (ref 8.7–10.3)
Chloride: 102 mmol/L (ref 96–106)
Creatinine, Ser: 0.78 mg/dL (ref 0.57–1.00)
Globulin, Total: 3.5 g/dL (ref 1.5–4.5)
Glucose: 118 mg/dL — ABNORMAL HIGH (ref 70–99)
Potassium: 4.2 mmol/L (ref 3.5–5.2)
Sodium: 140 mmol/L (ref 134–144)
Total Protein: 7.4 g/dL (ref 6.0–8.5)
eGFR: 86 mL/min/{1.73_m2} (ref >59)

## 2023-03-27 LAB — CBC WITH DIFFERENTIAL/PLATELET
Basophils Absolute: 0.1 10*3/uL (ref 0.0–0.2)
Basos: 1 %
EOS (ABSOLUTE): 0.2 10*3/uL (ref 0.0–0.4)
Eos: 2 %
Hematocrit: 41.4 % (ref 34.0–46.6)
Hemoglobin: 13.6 g/dL (ref 11.1–15.9)
Immature Grans (Abs): 0 10*3/uL (ref 0.0–0.1)
Immature Granulocytes: 0 %
Lymphocytes Absolute: 2.4 10*3/uL (ref 0.7–3.1)
Lymphs: 34 %
MCH: 28.6 pg (ref 26.6–33.0)
MCHC: 32.9 g/dL (ref 31.5–35.7)
MCV: 87 fL (ref 79–97)
Monocytes Absolute: 0.5 10*3/uL (ref 0.1–0.9)
Monocytes: 7 %
Neutrophils Absolute: 4.1 10*3/uL (ref 1.4–7.0)
Neutrophils: 56 %
Platelets: 319 10*3/uL (ref 150–450)
RBC: 4.76 x10E6/uL (ref 3.77–5.28)
RDW: 14 % (ref 11.7–15.4)
WBC: 7.2 10*3/uL (ref 3.4–10.8)

## 2023-03-27 LAB — HEMOGLOBIN A1C
Est. average glucose Bld gHb Est-mCnc: 123 mg/dL
Hgb A1c MFr Bld: 5.9 % — ABNORMAL HIGH (ref 4.8–5.6)

## 2023-03-27 LAB — VITAMIN D 25 HYDROXY (VIT D DEFICIENCY, FRACTURES): Vit D, 25-Hydroxy: <4 ng/mL — ABNORMAL LOW (ref 30.0–100.0)

## 2023-03-27 LAB — TSH: TSH: 2.34 u[IU]/mL (ref 0.450–4.500)

## 2023-03-27 LAB — B12 AND FOLATE PANEL
Folate: 2.9 ng/mL — ABNORMAL LOW (ref >3.0)
Vitamin B-12: 287 pg/mL (ref 232–1245)

## 2023-03-27 MED ORDER — VITAMIN D (ERGOCALCIFEROL) 1.25 MG (50000 UNIT) PO CAPS: 50000.0000 [IU] | ORAL_CAPSULE | ORAL | 0 refills | Status: DC

## 2023-04-16 ENCOUNTER — Encounter (HOSPITAL_COMMUNITY): Payer: MEDICAID | Admitting: Student

## 2023-04-21 ENCOUNTER — Encounter: Payer: Self-pay | Admitting: Physician Assistant

## 2023-04-28 ENCOUNTER — Ambulatory Visit (INDEPENDENT_AMBULATORY_CARE_PROVIDER_SITE_OTHER): Payer: MEDICAID | Admitting: Licensed Clinical Social Worker

## 2023-04-28 DIAGNOSIS — F339 Major depressive disorder, recurrent, unspecified: Secondary | ICD-10-CM | POA: Diagnosis not present

## 2023-04-28 DIAGNOSIS — F4310 Post-traumatic stress disorder, unspecified: Secondary | ICD-10-CM

## 2023-04-28 DIAGNOSIS — F411 Generalized anxiety disorder: Secondary | ICD-10-CM

## 2023-04-28 NOTE — Progress Notes (Signed)
 THERAPIST PROGRESS NOTE  Session Time: 60   Participation Level: Active  Behavioral Response: CasualAlertAnxious and Depressed  Type of Therapy: Individual Therapy  Treatment Goals addressed: Active     Anxiety Disorder CCP Problem  1 GAD      LTG: Patient will score less than 5 on the Generalized Anxiety Disorder 7 Scale (GAD-7) (Not Progressing)     Start:  06/19/21    Expected End:  10/23/23         STG: Patient will participate in at least 80% of scheduled individual psychotherapy sessions (Progressing)     Start:  06/19/21    Expected End:  10/23/23         STG: Patient will attend couples/family counseling sessions with partner/family member 1x per week for the next 4 weeks (Progressing)     Start:  06/19/21    Expected End:  10/23/23         Discuss risks and benefits of medication treatment options for this problem and prescribe as indicated (Completed)     Start:  06/19/21    End:  09/10/21      Encourage patient to take psychotropic medication as prescribed (Completed)     Start:  06/19/21    End:  09/10/21      Review results of GAD-7 with the patient to track progress (Completed)     Start:  06/19/21    End:  09/10/21      Work with patient to track symptoms, triggers and/or skill use through a mood chart, diary card, or journal (Completed)     Start:  06/19/21    End:  09/10/21        Depression CCP Problem  1 MDD     LTG: Makenley WILL SCORE LESS THAN 10 ON THE PATIENT HEALTH QUESTIONNAIRE (PHQ-9) (Progressing)     Start:  02/12/21    Expected End:  10/23/23         STG: Bebe WILL PARTICIPATE IN AT LEAST 80% OF SCHEDULED INDIVIDUAL PSYCHOTHERAPY SESSIONS (Progressing)     Start:  02/12/21    Expected End:  10/23/23         STG: Terra WILL COMPLETE AT LEAST 80% OF ASSIGNED HOMEWORK (Progressing)     Start:  02/12/21    Expected End:  10/23/23         STG: Reduce overall depression score by a minimum of 25% on the Patient Health  Questionnaire (PHQ-9) or the [place]berg Depression Rating Scale (MADRS) (Progressing)     Start:  02/12/21    Expected End:  10/23/23         WORK WITH Alandria TO TRACK SYMPTOMS, TRIGGERS AND/OR SKILL USE THROUGH A MOOD CHART, DIARY CARD, OR JOURNAL (Completed)     Start:  02/12/21    End:  09/10/21      ENCOURAGE Azarah TO PARTICIPATE IN RECOVERY PEER SUPPORT ACTIVITIES WEEKLY (Completed)     Start:  02/12/21    End:  10/09/21      Administer the PHQ-9 or MADRS weekly for 4 weeks (Completed)     Start:  02/12/21    End:  09/10/21      WORK WITH Cambria TO IDENTIFY THEIR 3 PERSONAL GOALS FOR MANAGING DEPRESSION SYMPTOMS AND ADD TO THIS PLAN (Completed)     Start:  02/12/21    End:  10/09/21      EDUCATE Lasheka ON COGNITIVE DISTORTIONS AND THE RATIONALE FOR TREATMENT OF DEPRESSION (Completed)  Start:  02/12/21    End:  10/09/21          ProgressTowards Goals: Progressing  Interventions: CBT, Motivational Interviewing, and Supportive   Suicidal/Homicidal: Nowithout intent/plan  Therapist Response:    Patient was alert and oriented x 5.  She was pleasant, cooperative, maintained good eye contact.  She engaged well in therapy session was dressed casually.  LCSW spoke to patient today about her attendance.  As example she has not been seen by this LCSW since September.  And she has 3 no-shows with this LCSW and multiple no-shows with medication provider.  She also has cancellations on her record as well between no-shows and cancellations she has over 10 in the last year.  LCSW spoke with patient about barriers to coming to appointments.  Patient reports Medicaid transportation sometimes does not come pick her up and sometimes she double book's appointments.  LCSW acknowledged transportation issues but stated that scheduling issues would be on patient to be responsible for.  LCSW and patient spoke today about utilizing a planner to help organize appointments in the  future.  LCSW also spoke with patient about once an appointment is on the schedule booking out transportation 24 to 48 hours in advance and also putting that on her organizer\planner.  Patient reports that she is agreeable to this plan moving forward to help with attendance for mental health.  Patient reports stressors today is family conflict due to grief and loss.  Patient reports that she has lost her aunt, cousin, sister, and mother in the last 12 months.  She also reports that her brother needs a heart transplant and liver transplant.  Patient reports that she is always assuming the worst in situations and has increased her depression.  She reports that the medications she is taking does not seem to be effective as she still has symptoms for tension, worry, restlessness, insomnia, and nightmares.  Intervention/plan: LCSW utilized supportive therapy for praise and encouragement.  LCSW utilized psychoanalytic therapy for patient to express thoughts, feelings and emotions and nonjudgmental environment.  LCSW utilize positive affirmations for motivational interviewing.  LCSW educated patient on the utilization of a planner to help decrease her anxiety for tension, worry, and restlessness.  Plan: Return again in 4 weeks.  Diagnosis: Major depression, recurrent, chronic (HCC)  GAD (generalized anxiety disorder)  PTSD (post-traumatic stress disorder)  Collaboration of Care: Other None today   Patient/Guardian was advised Release of Information must be obtained prior to any record release in order to collaborate their care with an outside provider. Patient/Guardian was advised if they have not already done so to contact the registration department to sign all necessary forms in order for [place] to release information regarding their care.   Consent: Patient/Guardian gives verbal consent for treatment and assignment of benefits for services provided during this visit. Patient/Guardian expressed  understanding and agreed to proceed.   Weber Cooks, LCSW 04/28/2023

## 2023-05-05 ENCOUNTER — Ambulatory Visit (INDEPENDENT_AMBULATORY_CARE_PROVIDER_SITE_OTHER): Payer: MEDICAID | Admitting: Student

## 2023-05-05 ENCOUNTER — Encounter (HOSPITAL_COMMUNITY): Payer: MEDICAID | Admitting: Student

## 2023-05-05 ENCOUNTER — Encounter (HOSPITAL_COMMUNITY): Payer: Self-pay | Admitting: Student

## 2023-05-05 VITALS — BP 104/71 | HR 64 | Ht 71.0 in | Wt 291.6 lb

## 2023-05-05 DIAGNOSIS — F4310 Post-traumatic stress disorder, unspecified: Secondary | ICD-10-CM | POA: Diagnosis not present

## 2023-05-05 DIAGNOSIS — F411 Generalized anxiety disorder: Secondary | ICD-10-CM

## 2023-05-05 DIAGNOSIS — F1021 Alcohol dependence, in remission: Secondary | ICD-10-CM | POA: Diagnosis not present

## 2023-05-05 DIAGNOSIS — F339 Major depressive disorder, recurrent, unspecified: Secondary | ICD-10-CM

## 2023-05-05 NOTE — Progress Notes (Unsigned)
 BH MD/PA/NP OP Progress Note   05/05/2023 9:28 AM  Lydia Vasquez  MRN:  [id number]  Chief Complaint:  Chief Complaint  Patient presents with  . Follow-up  . Medication Refill  . Stress   HPI:  Lydia Vasquez is a 63 yr old female who presents for Follow Up and Medication Management.  PPHx is significant for MDD, Recurrent, Anxiety, PTSD and Insomnia, and no history of Suicide Attempts, Self Injurious Behavior, or Psychiatric Hospitalizations.     Reports that mood has improved since last visit. She is taking walks more with daughter, which has proven beneficial. Sleep is still with nightmares, random and past experiences.   Anxiety is variable, worsening when somethings pops into her head that should not be there. She distracts herself with walking or moving around and doing tasks to distract herself. Sometimes works, but other times does not. Always with sense of impending doom. When she does walk, sometimes feels as though the person is after her.   Sleep is poor, average of 1 hour, broken, per night. Appetite is intact, as her diet has improved. She is no longer snacking throughout the the day.   Crying spells still present. Noticing mood lability. Stressor: Getting life back on track, particularly without mother's presence.    She is medication compliant but still believes medication not beneficial.  Her mood remains low with crying spells, her energy low, and she is anhedonic.  Today is her birthday, and her children have discussed taking her out for dinner, but she does not feel the desire.  Her sleep remains poor.  Sleeping approx 1 hour. She is not feeling joy.   Some passive SI. Kids and grandson are protective. Denies HI, AVH.   Denies cigarettes, alcohol, illicit drug use.     Visit Diagnosis:    ICD-10-CM   1. Major depression, recurrent, chronic (HCC)  F33.9     2. GAD (generalized anxiety disorder)  F41.1     3. PTSD (post-traumatic stress disorder)  F43.10      4. Alcohol use disorder, moderate, in sustained remission (HCC)  F10.21            Past Psychiatric History: MDD, Recurrent, Anxiety, PTSD and Insomnia, and no history of Suicide Attempts, Self Injurious Behavior, or Psychiatric Hospitalizations.    Past Medical History:  Past Medical History:  Diagnosis Date  . Anxiety   . Depression   . Hyperlipidemia   . Hypertension   . Ventricular tachycardia (HCC) 06/11/2020    Past Surgical History:  Procedure Laterality Date  . ABDOMINAL HYSTERECTOMY    . CESAREAN SECTION      Family Psychiatric History: Maternal Cousin- Schizophrenia or Bipolar Disorder No known Substance Abuse or Suicides.  Family History:  Family History  Problem Relation Age of Onset  . Stroke Mother   . Dementia Mother     Social History:  Social History   Socioeconomic History  . Marital status: Single    Spouse name: Not on file  . Number of children: Not on file  . Years of education: Not on file  . Highest education level: Not on file  Occupational History  . Not on file  Tobacco Use  . Smoking status: Former    Types: Cigarettes    Passive exposure: Past  . Smokeless tobacco: Never  Vaping Use  . Vaping status: Never Used  Substance and Sexual Activity  . Alcohol use: Not Currently  . Drug use: Never  . Sexual activity:  Not Currently  Other Topics Concern  . Not on file  Social History Narrative  . Not on file   Social Drivers of Health   Financial Resource Strain: Low Risk  (02/12/2021)   Overall Financial Resource Strain (CARDIA)   . Difficulty of Paying Living Expenses: Not hard at all  Food Insecurity: Food Insecurity Present (10/15/2022)   Hunger Vital Sign   . Worried About Programme researcher, broadcasting/film/video in the Last Year: Sometimes true   . Ran Out of Food in the Last Year: Sometimes true  Transportation Needs: Unmet Transportation Needs (10/15/2022)   PRAPARE - Transportation   . Lack of Transportation (Medical): Yes   . Lack of  Transportation (Non-Medical): Yes  Physical Activity: Inactive (10/15/2022)   Exercise Vital Sign   . Days of Exercise per Week: 0 days   . Minutes of Exercise per Session: 0 min  Stress: Stress Concern Present (10/15/2022)   [employer] of Occupational Health - Occupational Stress Questionnaire   . Feeling of Stress : Very much  Social Connections: Socially Isolated (10/15/2022)   Social Connection and Isolation Panel [NHANES]   . Frequency of Communication with Friends and Family: Twice a week   . Frequency of Social Gatherings with Friends and Family: Once a week   . Attends Religious Services: Never   . Active Member of Clubs or Organizations: No   . Attends Banker Meetings: Never   . Marital Status: Never married    Allergies: No Known Allergies  Metabolic Disorder Labs: Lab Results  Component Value Date   HGBA1C 5.9 (H) 03/26/2023   No results found for: "PROLACTIN" Lab Results  Component Value Date   CHOL 192 10/26/2019   TRIG 332 (H) 10/26/2019   HDL <10 (L) 10/26/2019   CHOLHDL NOT CALCULATED 10/26/2019   VLDL 66 (H) 10/26/2019   LDLCALC NOT CALCULATED 10/26/2019   Lab Results  Component Value Date   TSH 2.340 03/26/2023    Therapeutic Level Labs: No results found for: "LITHIUM" No results found for: "VALPROATE" No results found for: "CBMZ"  Current Medications: Current Outpatient Medications  Medication Sig Dispense Refill  . B Complex-C-Folic Acid (B COMPLEX-VITAMIN C-FOLIC ACID) 1 MG tablet Take 1 tablet by mouth daily with breakfast. 60 tablet 2  . Blood Pressure Monitoring (BLOOD PRESSURE CUFF) MISC 1 each by Does not apply route daily. 1 each 0  . carvedilol (COREG) 6.25 MG tablet Take 1 tablet (6.25 mg total) by mouth 2 (two) times daily with a meal. 60 tablet 3  . famotidine (PEPCID) 20 MG tablet TAKE 1 TABLET(20 MG) BY MOUTH TWICE DAILY 120 tablet 4  . FLUoxetine (PROZAC) 40 MG capsule Take 1 capsule (40 mg total) by mouth daily.  30 capsule 1  . prazosin (MINIPRESS) 2 MG capsule Take 1 capsule (2 mg total) by mouth at bedtime. 30 capsule 1  . Vitamin D, Ergocalciferol, (DRISDOL) 1.25 MG (50000 UNIT) CAPS capsule Take 1 capsule (50,000 Units total) by mouth every 7 (seven) days. 16 capsule 0   No current facility-administered medications for this visit.     Musculoskeletal: Strength & Muscle Tone: within normal limits Gait & Station: normal Patient leans: N/A  Psychiatric Specialty Exam: Review of Systems  Respiratory:  Negative for shortness of breath.   Cardiovascular:  Negative for chest pain.  Gastrointestinal:  Negative for abdominal pain, constipation, diarrhea, nausea and vomiting.  Genitourinary:  Positive for vaginal bleeding.  Neurological:  Negative for dizziness (occasional), weakness  and headaches.  Psychiatric/Behavioral:  Positive for dysphoric mood. Negative for hallucinations and suicidal ideas. The patient is nervous/anxious.     There were no vitals taken for this visit.There is no height or weight on file to calculate BMI.  General Appearance: Casual  Eye Contact: Good  Speech:  Clear and Coherent and Normal Rate  Volume:  Normal  Mood:  Anxious and Depressed  Affect:  Congruent and Depressed  Thought Process:  Coherent and Goal Directed  Orientation:  Full (Time, Place, and Person)  Thought Content: WDL and Logical   Suicidal Thoughts:  No active thoughts; some passive thoughts but lacks desire to act on them  Homicidal Thoughts:  No  Memory:  Immediate;   Good Recent;   Good  Judgement:  Fair  Insight:  Fair and Shallow  Psychomotor Activity: Normal  Concentration:  Concentration: Good and Attention Span: Good  Recall:  Good  Fund of Knowledge: Good  Language: Good  Akathisia:  Negative  Handed:  Right  AIMS (if indicated): not Vasquez  Assets:  Communication Skills Desire for Improvement Housing Leisure Time Resilience Social Support  ADL's:  Intact  Cognition: WNL   Sleep:  Poor   Screenings: GAD-7    Loss adjuster, chartered Office Visit from 03/26/2023 in [facility] [place] [facility] [place]. [facility] Counselor from 10/15/2022 in [facility] Office Visit from 06/18/2022 in [facility] [place] [facility] [place]. [facility] Counselor from 10/09/2021 in [facility] Counselor from 09/10/2021 in [facility]  Total GAD-7 Score 20 19 21 18 14       PHQ2-9    Flowsheet Row Office Visit from 03/26/2023 in [facility] [place] [facility] [place]. [facility] Counselor from 10/15/2022 in [facility] Office Visit from 06/18/2022 in [facility] [place] [facility] [place]. [facility] Counselor from 10/09/2021 in [facility] Counselor from 09/10/2021 in [facility]  PHQ-2 Total Score 6 4 2 3 6   PHQ-9 Total Score 18 15 18 17 19       Flowsheet Row ED from 01/24/2023 in [facility] Counselor from 10/15/2022 in [facility] Counselor from 10/09/2021 in [facility]  C-SSRS RISK CATEGORY No Risk Low Risk Low Risk        Assessment and Plan:  Lydia Vasquez is a 63 yr old female who presents for Follow Up and Medication Management.  PPHx is significant for MDD, Recurrent, Anxiety, PTSD, Grief, and Insomnia, and no history of Suicide Attempts, Self Injurious Behavior, or Psychiatric Hospitalizations.     Lydia Vasquez is again tearful on assessment today, noting concerns about her physical health and worries about her family.  She has not had no improvements in her mood nor anxiety.  She has not been experiencing joy and continues to have low mood.  Although today is her birthday, she remains anhedonic.  She is not yet been able to  follow up with PCP, and OB/GYN has not reached out to her.   Her nightmares, flashbacks, and hypervigilance have also remained heightened.  She has not yet noted a response to Prozac nor prazosin.  We will continue to titrate medications.  Due to the patient's concerns about her vaginal bleeding, this Clinical research associate reached  out to Dr. Daisey Dryer to get an appointment scheduled.  Dr. Daisey Dryer advised that she was not an established patient of the clinic.  We will place another referral for OB/GYN at this time.  This Clinical research associate also to reach out to [employer], patient's new PCP with the retirement of Dr. Brent Cambric, to ensure that labs are drawn during her next PCP visit in March: TSH, CBC, vitamin D, and CMP, in addition to other labs that may be drawn.     MDD, Recurrent, Chronic  PTSD  GAD  Insomnia : -Increase to Prozac 40 mg for depression and anxiety -Increase to prazosin 2 mg for anxiety 2/2 trauma  #Suspected OSA - Referral for sleep study placed and pending  #Vaginal bleeding -Referral placed for OB/GYN  #Alcohol use disorder, in sustained remission -Patient maintaining sobriety  Collaboration of Care: Collaboration of Care:Dr. Bahraini; [address], [place]  Patient/Guardian was advised Release of Information must be obtained prior to any record release in order to collaborate their care with an outside provider. Patient/Guardian was advised if they have not already Vasquez so to contact the registration department to sign all necessary forms in order for us  to release information regarding their care.   Consent: Patient/Guardian gives verbal consent for treatment and assignment of benefits for services provided during this visit. Patient/Guardian expressed understanding and agreed to proceed.    Lydia Done, MD 05/05/2023 9:28 AM

## 2023-05-05 NOTE — Patient Instructions (Addendum)
 Please call the number below to schedule your sleep study:  [facility] [phone]

## 2023-05-07 NOTE — Addendum Note (Signed)
 Addended by: Ulysess Gang A on: 05/07/2023 10:45 AM   Modules accepted: Level of Service

## 2023-05-26 ENCOUNTER — Encounter (HOSPITAL_COMMUNITY): Payer: MEDICAID | Admitting: Student

## 2023-05-27 ENCOUNTER — Ambulatory Visit: Payer: MEDICAID | Admitting: Family Medicine

## 2023-06-03 ENCOUNTER — Ambulatory Visit (HOSPITAL_COMMUNITY): Payer: MEDICAID | Admitting: Licensed Clinical Social Worker

## 2023-06-09 ENCOUNTER — Encounter (HOSPITAL_COMMUNITY): Payer: MEDICAID | Admitting: Student

## 2023-06-11 NOTE — Progress Notes (Deleted)
 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 If not, consider noncompliance with Na restriction   -if Na/K </=1, can increase dose of diuretic.  Varices screening / surveillance EGD:     Last EGD No history of varices. On prophylaxis *** Hepatic encephalopathy:  Pt does not report any symptoms consistent with HE and no asterixis on exam.  {acaccirrhosishepaticencephalopathy:26796} Most recent HCC screening:    US  on 10/12/2020 without reported focal liver lesions.   Last AFP 10/15/2020 2.5  Provided general information to the patient: -Continue daily multivitamin -Recommended 30 minutes of aerobic and resistance exercise 3 days/week -Encouraged pt to increase protein intake  Patient Care Team: Patient, No Pcp Per as PCP - General (General Practice)  HISTORY OF PRESENT ILLNESS: 63 y.o. 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  Appears patient  Last EGD was ***.  Last HCC screen: *** Last AFP 10/15/2020 2.5  Last INR: 10/15/2020 1.1   {cirrhosishepaticencephalopathy:26796} She {Denies/complains:31533} swelling.  She {Actions; are/are not:16769} on spironolactone and lasix .  Wt  Readings from Last 3 Encounters:  03/26/23 299 lb (135.6 kg)  01/24/23 296 lb (134.3 kg)  06/18/22 300 lb (136.1 kg)    Discussed the use of AI scribe software for clinical note transcription with the patient, who gave verbal consent to proceed.  History of Present Illness           Social history:  She  reports that she has quit smoking. Her smoking use included cigarettes. She has been exposed to tobacco smoke. She has never used smokeless tobacco. She reports that she does not currently use alcohol. She reports that she does not use drugs.  RELEVANT GI HISTORY, LABS, IMAGING: A. LIVER, NEEDLE CORE BIOPSY: - Severely active steatohepatitis (grade 3 of 3) with cirrhosis (stage 4 of 4) - Features suggestive of overlapping sepsis - See comment  COMMENT:  The biopsy is adequate for review and consists of several cores of liver showing a distorted architecture with nodule formation.  Hepatic nodules show marked macrovesicular steatosis (about 70%) with severe ballooning degeneration, including numerous well-formed Mallory hyalines. Multiple foci of lobular inflammation with neutrophilic satellitosis are present. There is evidence of ductular and canalicular cholestasis.  Portal tracts and fibrous septa show mostly neutrophilic inflammation. Trichrome and reticulin stains confirm cirrhosis. Iron stain shows no abnormal iron accumulation. PASD stain shows no globular inclusions within hepatocytes. The findings are that of moderately active steatohepatitis, alcoholic and/or non-alcoholic, with cirrhosis. Presence of well-formed Mallory bodies and occasional megamitochondria suggest the possibility alcoholic steatohepatitis.  But as histopathology cannot differentiate ASH from NASH conclusively, correlation with the patient's clinical history is essential. Presence of ductular cholestasis is suggestive of overlapping sepsis - differential diagnosis can include  drug-induced injury.   Clinical correlation is suggested.    CBC    Component Value Date/Time   WBC 7.2 03/26/2023 0922   WBC 11.2 (H) 11/29/2019 0859   RBC 4.76 03/26/2023 0922   RBC 3.43 (L) 11/29/2019 0859   HGB 13.6 03/26/2023 0922   HCT 41.4 03/26/2023 0922   PLT 319 03/26/2023 0922   MCV 87 03/26/2023 0922   MCH 28.6 03/26/2023 0922   MCH 32.9 11/29/2019 0859   MCHC 32.9 03/26/2023 0922   MCHC 31.6 11/29/2019 0859   RDW 14.0 03/26/2023 0922   LYMPHSABS 2.4 03/26/2023 0922   MONOABS 0.6 11/29/2019 0859   EOSABS 0.2 03/26/2023 0922   BASOSABS 0.1 03/26/2023 0922   Recent Labs    06/18/22 1208 03/26/23 0922  HGB 13.3 13.6    CMP     Component Value Date/Time   NA 140 03/26/2023 0922   K 4.2 03/26/2023 0922   CL 102 03/26/2023 0922   CO2 22 03/26/2023 0922   GLUCOSE 118 (H) 03/26/2023 0922   GLUCOSE 103 (H) 11/29/2019 0859   BUN 13 03/26/2023 0922   CREATININE 0.78 03/26/2023 0922   CALCIUM 9.1 03/26/2023 0922   PROT 7.4 03/26/2023 0922   ALBUMIN  3.9 03/26/2023 0922   AST 46 (H) 03/26/2023 0922   ALT 22 03/26/2023 0922   ALKPHOS 190 (H) 03/26/2023 0922   BILITOT 1.6 (H) 03/26/2023 0922   GFRNONAA >60 11/29/2019 0859   GFRAA >60 10/25/2019 0039      Latest Ref Rng & Units 03/26/2023    9:22 AM 06/18/2022   12:08 PM 10/15/2020    9:03 AM  Hepatic Function  Total Protein 6.0 - 8.5 g/dL 7.4  7.6  7.5   Albumin  3.9 - 4.9 g/dL 3.9  3.9  4.0   AST 0 - 40 IU/L 46  21  35   ALT 0 - 32 IU/L 22  12  19    Alk Phosphatase 44 - 121 IU/L 190  188  191   Total Bilirubin 0.0 - 1.2 mg/dL 1.6  1.0  1.0       Current Medications:    Current Outpatient Medications (Cardiovascular):    carvedilol  (COREG ) 6.25 MG tablet, Take 1 tablet (6.25 mg total) by mouth 2 (two) times daily with a meal.   prazosin  (MINIPRESS ) 2 MG capsule, Take 1 capsule (2 mg total) by mouth at bedtime.     Current Outpatient Medications (Other):    B Complex-C-Folic Acid (B COMPLEX-VITAMIN C-FOLIC ACID) 1 MG  tablet, Take 1 tablet by mouth daily with breakfast.   Blood Pressure Monitoring (BLOOD PRESSURE CUFF) MISC, 1 each by Does not apply route daily.   famotidine  (PEPCID ) 20 MG tablet, TAKE 1 TABLET(20 MG) BY MOUTH TWICE DAILY   FLUoxetine  (PROZAC ) 40 MG capsule, Take 1 capsule (40 mg total) by mouth daily.   Vitamin D , Ergocalciferol , (DRISDOL ) 1.25 MG (50000 UNIT) CAPS capsule, Take 1 capsule (50,000 Units total) by mouth every 7 (seven) days.  Medical History:  Past Medical History:  Diagnosis Date   Anxiety    Depression    Hyperlipidemia    Hypertension    Ventricular tachycardia (HCC) 06/11/2020   Allergies: No Known Allergies   Surgical History:  She  has a past surgical history that includes Abdominal hysterectomy and Cesarean section. Family History:  Her family history includes Dementia in her mother; Stroke in her mother.  REVIEW OF SYSTEMS  : All other systems reviewed and negative  except where noted in the History of Present Illness.  PHYSICAL EXAM: There were no vitals taken for this visit. General :  Alert, well developed female in no acute distress Head:  Normocephalic and atraumatic. Eyes :  {sclerae:26738},conjunctive {conjuctiva:26739}  Heart:  {HEART EXAM HEM/ONC:21750} Pulm:  Clear anteriorly; no wheezing Abdomen:   {BlankSingle:19197::"Distended","Ridged","Soft"}, {BlankSingle:19197::"Flat","Obese"} AB, skin exam {ABDOMEN SKIN EXAM:22649}, {BlankSingle:19197::"Absent","Hyperactive, tinkling","Hypoactive","Sluggish","Normal"} bowel sounds. {Desc; pc desc - abdomen tenderness:5168} tenderness {anatomy; site abdomen:5010}. {BlankMultiple:19196::"Without guarding","With guarding","Without rebound","With rebound"}, {Exam; abdomen organomegaly:15152}. {No plus wild card:[phone]}  fluid wave, {No plus wild card:[phone]}  shifting dullness.  Extremities:   {With/Without:[id number]} edema. Msk:  Symmetrical without gross deformities. Peripheral pulses intact.   Neurologic: Alert and  oriented x4;  grossly normal neurologically. {With-without:32421} asterixis or clonus.  Skin:   {With-without:32421} jaundice. {No plus wild card:[phone]} palmar erythema or spider angioma.   Psychiatric:  Demonstrates good judgement and reason without abnormal affect or behaviors.    Edmonia Gottron, PA-C 12:34 PM

## 2023-06-16 ENCOUNTER — Ambulatory Visit: Payer: MEDICAID | Admitting: Physician Assistant

## 2023-06-25 ENCOUNTER — Ambulatory Visit: Payer: MEDICAID | Admitting: Obstetrics and Gynecology

## 2023-06-25 ENCOUNTER — Encounter: Payer: Self-pay | Admitting: Obstetrics and Gynecology

## 2023-06-25 ENCOUNTER — Other Ambulatory Visit: Payer: Self-pay

## 2023-06-25 ENCOUNTER — Other Ambulatory Visit (HOSPITAL_COMMUNITY)
Admission: RE | Admit: 2023-06-25 | Discharge: 2023-06-25 | Disposition: A | Discharge status: Home / Self Care | Payer: MEDICAID | Source: Ambulatory Visit | Attending: Obstetrics and Gynecology | Admitting: Obstetrics and Gynecology

## 2023-06-25 VITALS — BP 134/86 | HR 83 | Wt 287.1 lb

## 2023-06-25 DIAGNOSIS — N930 Postcoital and contact bleeding: Secondary | ICD-10-CM | POA: Insufficient documentation

## 2023-06-25 DIAGNOSIS — Z8541 Personal history of malignant neoplasm of cervix uteri: Secondary | ICD-10-CM | POA: Insufficient documentation

## 2023-06-25 MED ORDER — ESTRADIOL 10 MCG VA TABS: 1.0000 | ORAL_TABLET | Freq: Every day | VAGINAL | 12 refills | Status: DC

## 2023-06-25 NOTE — Progress Notes (Signed)
 NEW GYNECOLOGY PATIENT Patient name: Lydia Vasquez MRN [id number]  Date of birth: 07-Jan-1961 Chief Complaint:   Vaginal Bleeding     History:  Lydia Vasquez is a 63 y.o. No obstetric history on file. being seen today for vaginal bleeding. Has bleeding with insertion. Cervical cancer 2001 and hysterectomy in 2012 (all care in [place]).     Discussed the use of AI scribe software for clinical note transcription with the patient, who gave verbal consent to proceed.  History of Present Illness Lydia Vasquez is a 63 year old female with a history of cervical cancer and hysterectomy who presents with post-coital bleeding.  She experiences post-coital bleeding primarily with any kind of insertion, specifically during sexual activity. The bleeding is noticed when she wipes afterwards, but it does not continue throughout the day. It is described as 'pink, pink, spotting, brown' and not like regular blood color. She has stopped engaging in sexual activity due to this issue.  She underwent a hysterectomy in 2012 as part of her cervical cancer treatment, which included radiation, chemotherapy, and radiation implants. She mentions that the radiation treatment caused darkening of the skin in the area.  She has not experienced any vaginal dryness and does not use any lubricant during intercourse. She recalls having a Pap smear after her hysterectomy but does not remember when it was done. She is uncertain about the follow-up care and surveillance needed after her surgery.     Gynecologic History No LMP recorded. Patient has had a hysterectomy. Contraception: status post hysterectomy Last Pap: unknown, reports history of cervical cancer Last Mammogram: 08/04/23 BIRADS 1 Last Colonoscopy: not seen on file  Obstetric History OB History  No obstetric history on file.    Past Medical History:  Diagnosis Date   Anxiety    Depression    Hyperlipidemia    Hypertension    Ventricular tachycardia (HCC)  06/11/2020    Past Surgical History:  Procedure Laterality Date   ABDOMINAL HYSTERECTOMY     CESAREAN SECTION      Current Outpatient Medications on File Prior to Visit  Medication Sig Dispense Refill   B Complex-C-Folic Acid (B COMPLEX-VITAMIN C-FOLIC ACID) 1 MG tablet Take 1 tablet by mouth daily with breakfast. 60 tablet 2   Blood Pressure Monitoring (BLOOD PRESSURE CUFF) MISC 1 each by Does not apply route daily. 1 each 0   carvedilol  (COREG ) 6.25 MG tablet Take 1 tablet (6.25 mg total) by mouth 2 (two) times daily with a meal. 60 tablet 3   famotidine  (PEPCID ) 20 MG tablet TAKE 1 TABLET(20 MG) BY MOUTH TWICE DAILY 120 tablet 4   FLUoxetine  (PROZAC ) 40 MG capsule Take 40 mg by mouth daily.     Vitamin D , Ergocalciferol , (DRISDOL ) 1.25 MG (50000 UNIT) CAPS capsule Take 1 capsule (50,000 Units total) by mouth every 7 (seven) days. 16 capsule 0   FLUoxetine  (PROZAC ) 40 MG capsule Take 1 capsule (40 mg total) by mouth daily. 30 capsule 1   prazosin  (MINIPRESS ) 2 MG capsule Take 1 capsule (2 mg total) by mouth at bedtime. 30 capsule 1   No current facility-administered medications on file prior to visit.    No Known Allergies  Social History:  reports that she has quit smoking. Her smoking use included cigarettes. She has been exposed to tobacco smoke. She has never used smokeless tobacco. She reports that she does not currently use alcohol. She reports that she does not use drugs.  Family History  Problem Relation Age  of Onset   Stroke Mother    Dementia Mother     The following portions of the patient's history were reviewed and updated as appropriate: allergies, current medications, past family history, past medical history, past social history, past surgical history and problem list.  Review of Systems Pertinent items noted in HPI and remainder of comprehensive ROS otherwise negative.  Physical Exam:  BP 134/86   Pulse 83   Wt 287 lb 2 oz (130.2 kg)   BMI 40.05 kg/m   Physical Exam Vitals and nursing note reviewed. Exam conducted with a chaperone present.  Constitutional:      Appearance: Normal appearance.  Pulmonary:     Effort: Pulmonary effort is normal.  Abdominal:     Palpations: Abdomen is soft.  Genitourinary:    General: Normal vulva.     Exam position: Lithotomy position.     Comments: Vaginal cuff intact, atrophic changes Mild left levator ani tenderness No vaginal cuff tenderness  No masses  Neurological:     Mental Status: She is alert.      Assessment and Plan:   Assessment & Plan Post-hysterectomy vaginal bleeding Bleeding occurs post-intercourse, likely due to minor trauma. History of cervical cancer necessitates evaluation for recurrence or abnormalities. - Pelvic exam demonstrates atrophic changes and no clear lesions or signs of recurrence  Cervical cancer Treated with hysterectomy, radiation, and chemotherapy. Continued surveillance required due to history. - Perform vaginal Pap smear to monitor for recurrence or abnormalities.    Routine preventative health maintenance measures emphasized. Please refer to After Visit Summary for other counseling recommendations.   Follow-up: No follow-ups on file.      Kiki Pelton, MD Obstetrician & Gynecologist, Faculty Practice [facility] [employer], [facility]

## 2023-06-26 ENCOUNTER — Telehealth: Payer: Self-pay | Admitting: Lactation Services

## 2023-06-26 NOTE — Telephone Encounter (Signed)
 Awaiting determination  Lydia Vasquez (Key: [id number]) PA Case ID #: [id number] Need Help? Call us  at [phone] Status sent iconSent to Plan today Drug Estradiol 10MCG tablets ePA cloud logo Form [employer] Electronic Prior Authorization Form

## 2023-06-29 ENCOUNTER — Ambulatory Visit: Payer: Self-pay | Admitting: Obstetrics and Gynecology

## 2023-06-29 LAB — CYTOLOGY - PAP
Comment: NEGATIVE
Diagnosis: NEGATIVE
High risk HPV: NEGATIVE

## 2023-06-29 MED ORDER — ESTRADIOL 10 MCG VA TABS
1.0000 | ORAL_TABLET | Freq: Every day | VAGINAL | 12 refills | Status: AC
Start: 2023-06-29 — End: ?

## 2023-06-29 NOTE — Telephone Encounter (Signed)
 Called and spoke with patient to let her know that a new Estradiol  pill has been sent in based on what is covered by her Insurance. Patient voiced understanding.

## 2023-06-29 NOTE — Telephone Encounter (Signed)
 Received fax from [facility]. They report Estradiol  10 mcg tablets are not covered.   Covered formulations include: Vagifem  Vaginal Tablets (Brand) Estring  Vaginal Ring Premarin Vaginal Cream    Will route to Dr. Elester Grim for advisement.

## 2023-06-30 ENCOUNTER — Encounter (HOSPITAL_COMMUNITY): Payer: MEDICAID | Admitting: Student

## 2023-07-02 ENCOUNTER — Ambulatory Visit (INDEPENDENT_AMBULATORY_CARE_PROVIDER_SITE_OTHER): Payer: MEDICAID | Admitting: Student

## 2023-07-02 DIAGNOSIS — F411 Generalized anxiety disorder: Secondary | ICD-10-CM

## 2023-07-02 DIAGNOSIS — F4310 Post-traumatic stress disorder, unspecified: Secondary | ICD-10-CM

## 2023-07-02 DIAGNOSIS — F339 Major depressive disorder, recurrent, unspecified: Secondary | ICD-10-CM

## 2023-07-02 NOTE — Progress Notes (Signed)
 [facility] MD/PA/NP OP Progress Note   07/02/2023 9:32 AM  Chene Kasinger  MRN:  [id number]  Chief Complaint:  Chief Complaint  Patient presents with   Follow-up   Medication Refill   Depression   Anxiety   Trauma   Stress   HPI:  Kem Hast is a 63 yr old female who presents for Follow Up and Medication Management.  PPHx is significant for MDD, Recurrent, Anxiety, PTSD and Insomnia, and no history of Suicide Attempts, Self Injurious Behavior, or Psychiatric Hospitalizations.     Reports that mood is about the same since last visit. Some down. She did have GYN visit. GI appointment upcoming.  She is still having difficulty sleeping  and is up and down. She is not getting along well with therapist, Juliene. Sleep is still with nightmares, random and past experiences.   Anxiety is variable, worried about her health and fears that something bad will happen. She distracts herself with walking or moving around and doing tasks to distract herself. Sometimes works, but other times does not. Always with sense of impending doom. When she does walk, sometimes feels as though the person is after her.   She is no longer taking walks with daughter, due to daughter's work schedule.   Sleep is poor, average of 1 hour, broken, per night. Appetite is intact, as her diet has improved. She is no longer snacking throughout the the day.   Crying spells still present. Noticing mood lability. Stressor: Getting life back on track, particularly without mother's presence.    She is medication compliant and has noted benefit of prozac , but not prazosin . As her anxiety remains elevated, discussed possibility of carvedilol  to propranolol, which will be discussed with PCP.  Patient is agreeable.  Some passive SI. Kids and grandson are protective. Denies HI, AVH.   Denies cigarettes, alcohol, illicit drug use.     Visit Diagnosis:    ICD-10-CM   1. Major depression, recurrent, chronic (HCC)  F33.9 FLUoxetine   (PROZAC ) 40 MG capsule    FLUoxetine  (PROZAC ) 20 MG capsule    2. GAD (generalized anxiety disorder)  F41.1 FLUoxetine  (PROZAC ) 40 MG capsule    FLUoxetine  (PROZAC ) 20 MG capsule    3. PTSD (post-traumatic stress disorder)  F43.10 cyproheptadine (PERIACTIN) 4 MG tablet    FLUoxetine  (PROZAC ) 40 MG capsule    FLUoxetine  (PROZAC ) 20 MG capsule            Past Psychiatric History: MDD, Recurrent, Anxiety, PTSD and Insomnia, and no history of Suicide Attempts, Self Injurious Behavior, or Psychiatric Hospitalizations.    Past Medical History:  Past Medical History:  Diagnosis Date   Anxiety    Depression    Hyperlipidemia    Hypertension    Ventricular tachycardia (HCC) 06/11/2020    Past Surgical History:  Procedure Laterality Date   ABDOMINAL HYSTERECTOMY     CESAREAN SECTION      Family Psychiatric History: Maternal Cousin- Schizophrenia or Bipolar Disorder No known Substance Abuse or Suicides.  Family History:  Family History  Problem Relation Age of Onset   Stroke Mother    Dementia Mother     Social History:  Social History   Socioeconomic History   Marital status: Single    Spouse name: Not on file   Number of children: Not on file   Years of education: Not on file   Highest education level: Not on file  Occupational History   Not on file  Tobacco Use   Smoking  status: Former    Types: Cigarettes    Passive exposure: Past   Smokeless tobacco: Never  Vaping Use   Vaping status: Never Used  Substance and Sexual Activity   Alcohol use: Not Currently   Drug use: Never   Sexual activity: Not Currently  Other Topics Concern   Not on file  Social History Narrative   Not on file   Social Drivers of Health   Financial Resource Strain: Low Risk  (02/12/2021)   Overall Financial Resource Strain (CARDIA)    Difficulty of Paying Living Expenses: Not hard at all  Food Insecurity: No Food Insecurity (06/25/2023)   Hunger Vital Sign    Worried About  Running Out of Food in the Last Year: Never true    Ran Out of Food in the Last Year: Never true  Transportation Needs: No Transportation Needs (06/25/2023)   PRAPARE - Administrator, Civil Service (Medical): No    Lack of Transportation (Non-Medical): No  Physical Activity: Inactive (10/15/2022)   Exercise Vital Sign    Days of Exercise per Week: 0 days    Minutes of Exercise per Session: 0 min  Stress: Stress Concern Present (10/15/2022)   [employer] of Occupational Health - Occupational Stress Questionnaire    Feeling of Stress : Very much  Social Connections: Socially Isolated (10/15/2022)   Social Connection and Isolation Panel    Frequency of Communication with Friends and Family: Twice a week    Frequency of Social Gatherings with Friends and Family: Once a week    Attends Religious Services: Never    Database administrator or Organizations: No    Attends Engineer, structural: Never    Marital Status: Never married    Allergies: No Known Allergies  Metabolic Disorder Labs: Lab Results  Component Value Date   HGBA1C 5.9 (H) 03/26/2023   No results found for: PROLACTIN Lab Results  Component Value Date   CHOL 192 10/26/2019   TRIG 332 (H) 10/26/2019   HDL <10 (L) 10/26/2019   CHOLHDL NOT CALCULATED 10/26/2019   VLDL 66 (H) 10/26/2019   LDLCALC NOT CALCULATED 10/26/2019   Lab Results  Component Value Date   TSH 2.340 03/26/2023    Therapeutic Level Labs: No results found for: LITHIUM No results found for: VALPROATE No results found for: CBMZ  Current Medications: Current Outpatient Medications  Medication Sig Dispense Refill   B Complex-C-Folic Acid (B COMPLEX-VITAMIN C-FOLIC ACID) 1 MG tablet Take 1 tablet by mouth daily with breakfast. 60 tablet 2   carvedilol  (COREG ) 6.25 MG tablet Take 1 tablet (6.25 mg total) by mouth 2 (two) times daily with a meal. 60 tablet 3   cyproheptadine (PERIACTIN) 4 MG tablet Take 1 tablet (4 mg  total) by mouth at bedtime. 30 tablet 0   Estradiol  10 MCG TABS vaginal tablet Place 1 tablet (10 mcg total) vaginally at bedtime. Nightly for 2 weeks and then 2 nights a week thereafter 30 tablet 12   famotidine  (PEPCID ) 20 MG tablet TAKE 1 TABLET(20 MG) BY MOUTH TWICE DAILY 120 tablet 4   FLUoxetine  (PROZAC ) 20 MG capsule Take 1 capsule (20 mg total) by mouth daily. Take with 40 mg capsule for daily total of 60 mg. 30 capsule 1   FLUoxetine  (PROZAC ) 40 MG capsule Take 40 mg by mouth daily.     Blood Pressure Monitoring (BLOOD PRESSURE CUFF) MISC 1 each by Does not apply route daily. 1 each 0  FLUoxetine  (PROZAC ) 40 MG capsule Take 1 capsule (40 mg total) by mouth daily. Take with 20 mg capsule for daily total of 60 mg. 30 capsule 1   Vitamin D , Ergocalciferol , (DRISDOL ) 1.25 MG (50000 UNIT) CAPS capsule Take 1 capsule (50,000 Units total) by mouth every 7 (seven) days. 16 capsule 0   No current facility-administered medications for this visit.     Musculoskeletal: Strength & Muscle Tone: within normal limits Gait & Station: normal Patient leans: N/A  Psychiatric Specialty Exam: Review of Systems  Respiratory:  Negative for shortness of breath.   Cardiovascular:  Negative for chest pain.  Gastrointestinal:  Negative for abdominal pain, constipation, diarrhea, nausea and vomiting.  Genitourinary:  Negative for vaginal bleeding.  Neurological:  Negative for dizziness (occasional), weakness and headaches.  Psychiatric/Behavioral:  Positive for dysphoric mood. Negative for hallucinations and suicidal ideas. The patient is nervous/anxious.     There were no vitals taken for this visit.There is no height or weight on file to calculate BMI.  General Appearance: Well-groomed  Eye Contact: Good  Speech:  Clear and Coherent and Normal Rate  Volume:  Normal  Mood:  Anxious and Depressed  Affect:  Congruent  Thought Process:  Coherent and Goal Directed  Orientation:  Full (Time, Place, and  Person)  Thought Content: WDL and Logical   Suicidal Thoughts:  No   Homicidal Thoughts:  No  Memory:  Immediate;   Good Recent;   Good  Judgement:  Good  Insight:  Fair, improving  Psychomotor Activity: Normal  Concentration:  Concentration: Good and Attention Span: Good  Recall:  Good  Fund of Knowledge: Good  Language: Good  Akathisia:  Negative  Handed:  Right  AIMS (if indicated): not done  Assets:  Communication Skills Desire for Improvement Housing Leisure Time Resilience Social Support  ADL's:  Intact  Cognition: WNL  Sleep:  Poor   Screenings: GAD-7    Flowsheet Row Office Visit from 06/25/2023 in [facility] [employer] at [facility] Office Visit from 03/26/2023 in [facility] [place] [facility] [place] Counselor from 10/15/2022 in [facility] Office Visit from 06/18/2022 in [facility] [place] - A [facility] Of [place]. [facility] Counselor from 10/09/2021 in [facility]  Total GAD-7 Score 18 20 19 21 18    PHQ2-9    Flowsheet Row Office Visit from 06/25/2023 in Center for [facility] Office Visit from 03/26/2023 in [facility] [place] - A Dept Of [place]. [facility] Counselor from 10/15/2022 in [facility] Office Visit from 06/18/2022 in [facility] [place] - A Dept Of [place]. [facility] Counselor from 10/09/2021 in [facility]  PHQ-2 Total Score 6 6 4 2 3   PHQ-9 Total Score 17 18 15 18 17    Flowsheet Row UC from 01/24/2023 in [facility] at [facility]) Counselor from 10/15/2022 in [facility] Counselor from 10/09/2021 in [facility]  C-SSRS RISK CATEGORY No Risk Low Risk Low Risk     Assessment and Plan:   Yu Baena is a 63 yr old female who presents for Follow Up and Medication Management.  PPHx is significant for MDD, Recurrent, Anxiety, PTSD, Grief, and Insomnia, and no history of Suicide Attempts, Self Injurious Behavior,  or Psychiatric Hospitalizations.     Audryanna reports some worsening of her mood, anxiety, and sleep since last visit.  She is not as active, as her daughter's work schedule does not permit for them to go out for walks as frequently.  Additionally, her nightmares, flashbacks, and hypervigilance are heightened and had not been adequately addressed with prazosin .  She did not notice worsening of symptoms with discontinuation of prazosin .  Her sleep remains poor, and she is not trialed cyproheptadine.  As her depressive symptoms have worsened and led to significant dysfunction, do believe it reasonable to increase her Prozac  today.  Of note, patient requires a lot of encouragement to make changes and decisions in her care.  She requests a new therapist, as she believes that she would do better with a female versus a female therapist.  She does still have significant concerns about her health, but she is getting concerns addressed and questions answered in her multiple appointments.  Patient poses no safety concerns toward herself and others at this time.  Patient is advised that her follow-up visit will be in approximately 6 weeks with Dr. Izella, as this writer will be leaving the practice in late June.   MDD, Recurrent, Chronic  PTSD  GAD  Insomnia : -Increase Prozac  60 mg for depression and anxiety -START Cyproheptadine 4 mg for trauma related anxiety and nightmares.  #Suspected OSA - Referral for sleep study placed and pending; this was scheduled in office with patient for 7/8  #Vaginal bleeding -Currently being addressed by OB/GYN  #Alcohol use disorder, in sustained remission -Patient maintaining sobriety  Collaboration of Care: Collaboration of Care:Dr.  Bahraini; sleep medicine at [facility]  Patient/Guardian was advised Release of Information must be obtained prior to any record release in order to collaborate their care with an outside provider. Patient/Guardian was advised if they have not already done so to contact the registration department to sign all necessary forms in order for us  to release information regarding their care.   Consent: Patient/Guardian gives verbal consent for treatment and assignment of benefits for services provided during this visit. Patient/Guardian expressed understanding and agreed to proceed.    Charmaine Myrtle, MD 07/02/2023 9:32 AM

## 2023-07-11 MED ORDER — CYPROHEPTADINE HCL 4 MG PO TABS
4.0000 mg | ORAL_TABLET | Freq: Every day | ORAL | 0 refills | Status: AC
Start: 2023-07-11 — End: ?

## 2023-07-11 MED ORDER — FLUOXETINE HCL 40 MG PO CAPS
40.0000 mg | ORAL_CAPSULE | Freq: Every day | ORAL | 1 refills | Status: AC
Start: 2023-07-11 — End: 2023-09-09

## 2023-07-11 MED ORDER — FLUOXETINE HCL 20 MG PO CAPS: 20.0000 mg | ORAL_CAPSULE | Freq: Every day | ORAL | 1 refills | Status: AC

## 2023-07-21 ENCOUNTER — Ambulatory Visit: Payer: MEDICAID | Admitting: Family Medicine

## 2023-07-28 ENCOUNTER — Institutional Professional Consult (permissible substitution): Payer: MEDICAID | Admitting: Neurology

## 2023-07-31 NOTE — Progress Notes (Deleted)
 07/31/2023 Lydia Vasquez [id number] 07-Oct-1960  Referring provider: Danton Jon HERO, PA-C Primary GI doctor: {acdocs:27040}  ASSESSMENT AND PLAN:  Alcoholic cirrhosis  Previously followed by [facility], liver biopsy 2021 severely active steatohepatitis with cirrhosis Serologic workup: 09/2020 negative AMA anti-smooth muscle antibody, ceruloplasmin, ferritin, ANA, hepatitis B and C, did not have immunity 03/26/2023 WBC 7.2 HGB 13.6 Platelets 319 03/26/2023 AST 46 ALT 22 Alkphos 190 TBili 1.6 10/15/2020 INR 1.1  Ascites:      Last LVP -Nutrition and low sodium diet discussed with patient and information given - appears euvolemic Will get Urine electrolytes (Na and K) and spot urine protein  - if Na/K >1, pt should be losing fluid weight. If not, consider noncompliance with Na restriction   -if Na/K </=1, can increase dose of diuretic.  Varices screening / surveillance EGD:     Last EGD No history of varices. On prophylaxis *** Hepatic encephalopathy:  Pt does not report any symptoms consistent with HE and no asterixis on exam.  {acaccirrhosishepaticencephalopathy:26796} Most recent HCC screening:    US  on *** without reported focal liver lesions.   Last AFP 10/15/2020 2.5  Provided general information to the patient: -Continue daily multivitamin -Recommended 30 minutes of aerobic and resistance exercise 3 days/week -Encouraged pt to increase protein intake  B12 and folic acid deficiency 03/26/2023 B12 287 Folate 2.9  Patient Care Team: Patient, No Pcp Per as PCP - General (General Practice)  HISTORY OF PRESENT ILLNESS: 63 y.o. female presents as a new patient for evaluation of cirrhosis secondary to ETOH. Has history of the following complications from cirrhosis: {ACcirrhosiscomplications:29455}  Previously seen in 2022 by [facility] for alcoholic cirrhosis. Liver biopsy 2021 severely active steatohepatitis with cirrhosis Negative HCV  Last EGD was ***.  Last HCC  screen: 09/2020 RUQ US  currently gallbladder contracted 1 cm stone negative Murphy no ascites no focal lesions Last AFP 10/15/2020 2.5  Last INR: 10/15/2020 1.1   {cirrhosishepaticencephalopathy:26796} She {Denies/complains:31533} swelling.  She {Actions; are/are not:16769} on spironolactone and lasix .  Wt Readings from Last 3 Encounters:  06/25/23 287 lb 2 oz (130.2 kg)  03/26/23 299 lb (135.6 kg)  01/24/23 296 lb (134.3 kg)    Discussed the use of AI scribe software for clinical note transcription with the patient, who gave verbal consent to proceed.  History of Present Illness            Hepatitis immunity status:  No results found for requested labs within last 1095 days. HepA No results found for requested labs within last 1095 days.  No results found for requested labs within last 1095 days. HepBsAG No results found for requested labs within last 1095 days.  No results found for requested labs within last 1095 days. HepAsAB No results found for requested labs within last 1095 days.  No results found for requested labs within last 1095 days. HepCAB No results found for requested labs within last 1095 days.   Social history:  She  reports that she has quit smoking. Her smoking use included cigarettes. She has been exposed to tobacco smoke. She has never used smokeless tobacco. She reports that she does not currently use alcohol. She reports that she does not use drugs. Patient denies history of injectable or intranasal drug use, tattoos, high risk sexual behavior, blood transfusion, incarceration, military service, or travel outside the [place] .  She {Actions; denies-reports:120008} ETOH use. Denies ever having attended an alcohol treatment program. Denies history of DUI. Denies history  of ETOH withdrawal.  RELEVANT GI HISTORY, LABS, IMAGING:  CBC    Component Value Date/Time   WBC 7.2 03/26/2023 0922   WBC 11.2 (H) 11/29/2019 0859   RBC 4.76 03/26/2023 0922   RBC 3.43  (L) 11/29/2019 0859   HGB 13.6 03/26/2023 0922   HCT 41.4 03/26/2023 0922   PLT 319 03/26/2023 0922   MCV 87 03/26/2023 0922   MCH 28.6 03/26/2023 0922   MCH 32.9 11/29/2019 0859   MCHC 32.9 03/26/2023 0922   MCHC 31.6 11/29/2019 0859   RDW 14.0 03/26/2023 0922   LYMPHSABS 2.4 03/26/2023 0922   MONOABS 0.6 11/29/2019 0859   EOSABS 0.2 03/26/2023 0922   BASOSABS 0.1 03/26/2023 0922   Recent Labs    03/26/23 0922  HGB 13.6    CMP     Component Value Date/Time   NA 140 03/26/2023 0922   K 4.2 03/26/2023 0922   CL 102 03/26/2023 0922   CO2 22 03/26/2023 0922   GLUCOSE 118 (H) 03/26/2023 0922   GLUCOSE 103 (H) 11/29/2019 0859   BUN 13 03/26/2023 0922   CREATININE 0.78 03/26/2023 0922   CALCIUM 9.1 03/26/2023 0922   PROT 7.4 03/26/2023 0922   ALBUMIN  3.9 03/26/2023 0922   AST 46 (H) 03/26/2023 0922   ALT 22 03/26/2023 0922   ALKPHOS 190 (H) 03/26/2023 0922   BILITOT 1.6 (H) 03/26/2023 0922   GFRNONAA >60 11/29/2019 0859   GFRAA >60 10/25/2019 0039      Latest Ref Rng & Units 03/26/2023    9:22 AM 06/18/2022   12:08 PM 10/15/2020    9:03 AM  Hepatic Function  Total Protein 6.0 - 8.5 g/dL 7.4  7.6  7.5   Albumin  3.9 - 4.9 g/dL 3.9  3.9  4.0   AST 0 - 40 IU/L 46  21  35   ALT 0 - 32 IU/L 22  12  19    Alk Phosphatase 44 - 121 IU/L 190  188  191   Total Bilirubin 0.0 - 1.2 mg/dL 1.6  1.0  1.0       Current Medications:    Current Outpatient Medications (Cardiovascular):    carvedilol  (COREG ) 6.25 MG tablet, Take 1 tablet (6.25 mg total) by mouth 2 (two) times daily with a meal.  Current Outpatient Medications (Respiratory):    cyproheptadine  (PERIACTIN ) 4 MG tablet, Take 1 tablet (4 mg total) by mouth at bedtime.    Current Outpatient Medications (Other):    B Complex-C-Folic Acid (B COMPLEX-VITAMIN C-FOLIC ACID) 1 MG tablet, Take 1 tablet by mouth daily with breakfast.   Blood Pressure Monitoring (BLOOD PRESSURE CUFF) MISC, 1 each by Does not apply route  daily.   Estradiol  10 MCG TABS vaginal tablet, Place 1 tablet (10 mcg total) vaginally at bedtime. Nightly for 2 weeks and then 2 nights a week thereafter   famotidine  (PEPCID ) 20 MG tablet, TAKE 1 TABLET(20 MG) BY MOUTH TWICE DAILY   FLUoxetine  (PROZAC ) 20 MG capsule, Take 1 capsule (20 mg total) by mouth daily. Take with 40 mg capsule for daily total of 60 mg.   FLUoxetine  (PROZAC ) 40 MG capsule, Take 40 mg by mouth daily.   FLUoxetine  (PROZAC ) 40 MG capsule, Take 1 capsule (40 mg total) by mouth daily. Take with 20 mg capsule for daily total of 60 mg.   Vitamin D , Ergocalciferol , (DRISDOL ) 1.25 MG (50000 UNIT) CAPS capsule, Take 1 capsule (50,000 Units total) by mouth every 7 (seven) days.  Medical History:  Past Medical History:  Diagnosis Date   Anxiety    Depression    Hyperlipidemia    Hypertension    Ventricular tachycardia (HCC) 06/11/2020   Allergies: No Known Allergies   Surgical History:  She  has a past surgical history that includes Abdominal hysterectomy and Cesarean section. Family History:  Her family history includes Dementia in her mother; Stroke in her mother.  REVIEW OF SYSTEMS  : All other systems reviewed and negative except where noted in the History of Present Illness.  PHYSICAL EXAM: There were no vitals taken for this visit. General :  Alert, well developed female in no acute distress Head:  Normocephalic and atraumatic. Eyes :  {sclerae:26738},conjunctive {conjuctiva:26739}  Heart:  {HEART EXAM HEM/ONC:21750} Pulm:  Clear anteriorly; no wheezing Abdomen:   {BlankSingle:19197::Distended,Ridged,Soft}, {BlankSingle:19197::Flat,Obese} AB, skin exam {ABDOMEN SKIN EXAM:22649}, {BlankSingle:19197::Absent,Hyperactive, tinkling,Hypoactive,Sluggish,Normal} bowel sounds. {Desc; pc desc - abdomen tenderness:5168} tenderness {anatomy; site abdomen:5010}. {BlankMultiple:19196::Without guarding,With guarding,Without rebound,With rebound},  {Exam; abdomen organomegaly:15152}. {No plus wild card:[phone]}  fluid wave, {No plus wild card:[phone]}  shifting dullness.  Extremities:   {With/Without:304960234} edema. Msk:  Symmetrical without gross deformities. Peripheral pulses intact.  Neurologic: Alert and  oriented x4;  grossly normal neurologically. {With-without:32421} asterixis or clonus.  Skin:   {With-without:32421} jaundice. {No plus wild card:[phone]} palmar erythema or spider angioma.   Psychiatric:  Demonstrates good judgement and reason without abnormal affect or behaviors.    Alan JONELLE Coombs, PA-C 1:11 PM

## 2023-08-04 ENCOUNTER — Ambulatory Visit: Payer: MEDICAID | Admitting: Physician Assistant

## 2023-08-13 NOTE — Progress Notes (Deleted)
 [facility] MD/PA/NP OP Progress Note   07/02/2023 9:32 AM  Lydia Vasquez  MRN:  [id number]    Assessment and Plan:  Lydia Vasquez is a 63 yr old female who presents for Follow Up and Medication Management.  PPHx is significant for MDD, Recurrent, Anxiety, PTSD, Grief, and Insomnia, and no history of Suicide Attempts, Self Injurious Behavior, or Psychiatric Hospitalizations.  Additionally, her nightmares, flashbacks, and hypervigilance are heightened and had not been adequately addressed with prazosin . Previously increased Prazosin .  ***  #MDD, Recurrent, Chronic #Insomnia #PTSD #GAD Trials: Prazosin  (hypotension)  -Prozac  60 mg for depression and anxiety -Cyproheptadine  4 mg for trauma related anxiety and nightmares.  #Alcohol use disorder, in sustained remission -Patient maintaining sobriety  #Suspected OSA - Referral for sleep study placed and pending; this was scheduled in office with patient for 7/8   Chief Complaint:  No chief complaint on file.  Identifying information: Lydia Vasquez is a 63 yr old female who presents for Follow Up and Medication Management.  PPHx is significant for MDD, Recurrent, Anxiety, PTSD and Insomnia, and no history of Suicide Attempts, Self Injurious Behavior, or Psychiatric Hospitalizations.    Subjective:  Patient seen ***.  Patient reports feeling *** today. Since the previous visit, ***. Regarding medications, patient notes ***. Patient reports the following adverse effects: ***.   Patient reports *** sleep, ***. Patient reports *** appetite, ***.   Patient denies current SI, HI, and AVH. ***  Stressors include ***. Going on walks? *** Anxiety?***  Substance use: ***    Visit Diagnosis:  No diagnosis found.   Past Psychiatric History: MDD, Recurrent, Anxiety, PTSD and Insomnia, and no history of Suicide Attempts, Self Injurious Behavior, or Psychiatric Hospitalizations.    Past Medical History:  Past Medical History:  Diagnosis  Date   Anxiety    Depression    Hyperlipidemia    Hypertension    Ventricular tachycardia (HCC) 06/11/2020    Past Surgical History:  Procedure Laterality Date   ABDOMINAL HYSTERECTOMY     CESAREAN SECTION      Family Psychiatric History: Maternal Cousin- Schizophrenia or Bipolar Disorder No known Substance Abuse or Suicides.  Family History:  Family History  Problem Relation Age of Onset   Stroke Mother    Dementia Mother     Social History:  Living: *** Occupation: *** Relationship: *** Children: *** Support: *** Legal History: ***   Allergies: No Known Allergies  Metabolic Disorder Labs: Lab Results  Component Value Date   HGBA1C 5.9 (H) 03/26/2023   No results found for: PROLACTIN Lab Results  Component Value Date   CHOL 192 10/26/2019   TRIG 332 (H) 10/26/2019   HDL <10 (L) 10/26/2019   CHOLHDL NOT CALCULATED 10/26/2019   VLDL 66 (H) 10/26/2019   LDLCALC NOT CALCULATED 10/26/2019   Lab Results  Component Value Date   TSH 2.340 03/26/2023    Therapeutic Level Labs: No results found for: LITHIUM No results found for: VALPROATE No results found for: CBMZ  Current Medications: Current Outpatient Medications  Medication Sig Dispense Refill   B Complex-C-Folic Acid (B COMPLEX-VITAMIN C-FOLIC ACID) 1 MG tablet Take 1 tablet by mouth daily with breakfast. 60 tablet 2   Blood Pressure Monitoring (BLOOD PRESSURE CUFF) MISC 1 each by Does not apply route daily. 1 each 0   carvedilol  (COREG ) 6.25 MG tablet Take 1 tablet (6.25 mg total) by mouth 2 (two) times daily with a meal. 60 tablet 3   cyproheptadine  (PERIACTIN ) 4 MG tablet  Take 1 tablet (4 mg total) by mouth at bedtime. 30 tablet 0   Estradiol  10 MCG TABS vaginal tablet Place 1 tablet (10 mcg total) vaginally at bedtime. Nightly for 2 weeks and then 2 nights a week thereafter 30 tablet 12   famotidine  (PEPCID ) 20 MG tablet TAKE 1 TABLET(20 MG) BY MOUTH TWICE DAILY 120 tablet 4   FLUoxetine   (PROZAC ) 20 MG capsule Take 1 capsule (20 mg total) by mouth daily. Take with 40 mg capsule for daily total of 60 mg. 30 capsule 1   FLUoxetine  (PROZAC ) 40 MG capsule Take 40 mg by mouth daily.     FLUoxetine  (PROZAC ) 40 MG capsule Take 1 capsule (40 mg total) by mouth daily. Take with 20 mg capsule for daily total of 60 mg. 30 capsule 1   Vitamin D , Ergocalciferol , (DRISDOL ) 1.25 MG (50000 UNIT) CAPS capsule Take 1 capsule (50,000 Units total) by mouth every 7 (seven) days. 16 capsule 0   No current facility-administered medications for this visit.   Psychiatric Specialty Exam: General Appearance: appears at stated age, casually dressed and groomed ***  Behavior: pleasant and cooperative ***  Psychomotor Activity: no psychomotor agitation or retardation noted ***  Eye Contact: fair *** Speech: normal amount, volume and fluency ***   Mood: euthymic *** Affect: congruent, pleasant and interactive ***  Thought Process: linear, goal directed, no circumstantial or tangential thought process noted, no racing thoughts or flight of ideas *** Descriptions of Associations: intact ***  Thought Content Hallucinations: denies AH, VH , does not appear responding to stimuli *** Delusions: no paranoia, delusions of control, grandeur, ideas of reference, thought broadcasting, and magical thinking *** Suicidal Thoughts: denies SI, intention, plan *** Homicidal Thoughts: denies HI, intention, plan ***  Alertness/Orientation: alert and fully oriented ***  Insight: fair*** Judgment: fair***  Memory: intact ***  Executive Functions  Concentration: intact *** Attention Span: fair *** Recall: intact *** Fund of Knowledge: fair ***  Physical Exam *** General: Pleasant, well-appearing ***. No acute distress. Pulmonary: Normal effort. No wheezing or rales. Skin: No obvious rash or lesions. Neuro: A&Ox3.No focal deficit.  Review of Systems *** No reported symptoms   Screenings: GAD-7     Flowsheet Row Office Visit from 06/25/2023 in [facility] [employer] at [facility] Office Visit from 03/26/2023 in [facility] [place] [facility] A Dept Of [place]. [facility] Counselor from 10/15/2022 in [facility] Office Visit from 06/18/2022 in [facility] [place] - A Dept Of [place]. [facility] Counselor from 10/09/2021 in [facility]  Total GAD-7 Score 18 20 19 21 18    PHQ2-9    Flowsheet Row Office Visit from 06/25/2023 in Center for [facility] at [facility] Office Visit from 03/26/2023 in [facility] [place] - A Dept Of [place]. [facility] Counselor from 10/15/2022 in [facility] Office Visit from 06/18/2022 in [facility] [place] - A Dept Of [place]. [facility] Counselor from 10/09/2021 in [facility]  PHQ-2 Total Score 6 6 4 2 3   PHQ-9 Total Score 17 18 15 18 17    Flowsheet Row UC from 01/24/2023 in [facility] at [facility]) Counselor from 10/15/2022 in [facility] Counselor from 10/09/2021 in [facility]  C-SSRS RISK CATEGORY No Risk Low Risk  Low Risk     Ismael Franco, MD PGY-3 Psychiatry Resident

## 2023-08-17 ENCOUNTER — Encounter: Payer: Self-pay | Admitting: Neurology

## 2023-08-17 ENCOUNTER — Institutional Professional Consult (permissible substitution): Payer: MEDICAID | Admitting: Neurology

## 2023-08-21 ENCOUNTER — Encounter (HOSPITAL_COMMUNITY): Payer: MEDICAID | Admitting: Psychiatry

## 2023-09-01 ENCOUNTER — Telehealth: Payer: Self-pay | Admitting: General Practice

## 2023-09-01 NOTE — Telephone Encounter (Signed)
 Confirmed appt for 8/13

## 2023-09-02 ENCOUNTER — Ambulatory Visit: Payer: MEDICAID | Admitting: Family Medicine

## 2023-10-13 ENCOUNTER — Encounter: Payer: Self-pay | Admitting: Family Medicine

## 2023-10-13 ENCOUNTER — Ambulatory Visit: Payer: MEDICAID | Attending: Family Medicine | Admitting: Family Medicine

## 2023-10-13 VITALS — BP 142/91 | HR 82 | Temp 98.3°F | Ht 71.0 in | Wt 290.4 lb

## 2023-10-13 DIAGNOSIS — I10 Essential (primary) hypertension: Secondary | ICD-10-CM

## 2023-10-13 DIAGNOSIS — I459 Conduction disorder, unspecified: Secondary | ICD-10-CM | POA: Diagnosis not present

## 2023-10-13 DIAGNOSIS — Z1211 Encounter for screening for malignant neoplasm of colon: Secondary | ICD-10-CM

## 2023-10-13 DIAGNOSIS — R1013 Epigastric pain: Secondary | ICD-10-CM | POA: Diagnosis not present

## 2023-10-13 DIAGNOSIS — J069 Acute upper respiratory infection, unspecified: Secondary | ICD-10-CM

## 2023-10-13 DIAGNOSIS — F339 Major depressive disorder, recurrent, unspecified: Secondary | ICD-10-CM

## 2023-10-13 DIAGNOSIS — R002 Palpitations: Secondary | ICD-10-CM | POA: Diagnosis not present

## 2023-10-13 DIAGNOSIS — E559 Vitamin D deficiency, unspecified: Secondary | ICD-10-CM

## 2023-10-13 MED ORDER — PREDNISONE 20 MG PO TABS: 20.0000 mg | ORAL_TABLET | Freq: Every day | ORAL | 0 refills | Status: AC

## 2023-10-13 MED ORDER — CARVEDILOL 6.25 MG PO TABS: 6.2500 mg | ORAL_TABLET | Freq: Two times a day (BID) | ORAL | 3 refills | Status: AC

## 2023-10-13 MED ORDER — FAMOTIDINE 20 MG PO TABS
ORAL_TABLET | ORAL | 4 refills | Status: AC
Start: 2023-10-13 — End: ?

## 2023-10-13 MED ORDER — ALBUTEROL SULFATE HFA 108 (90 BASE) MCG/ACT IN AERS: 2.0000 | INHALATION_SPRAY | Freq: Four times a day (QID) | RESPIRATORY_TRACT | 0 refills | Status: AC | PRN

## 2023-10-13 MED ORDER — BENZONATATE 200 MG PO CAPS: 200.0000 mg | ORAL_CAPSULE | Freq: Two times a day (BID) | ORAL | 0 refills | Status: AC | PRN

## 2023-10-13 MED ORDER — VITAMIN D (ERGOCALCIFEROL) 1.25 MG (50000 UNIT) PO CAPS: 50000.0000 [IU] | ORAL_CAPSULE | ORAL | 1 refills | Status: AC

## 2023-10-13 NOTE — Progress Notes (Signed)
 Subjective:  Patient ID: Lydia Vasquez, female    DOB: 1960-03-31  Age: 63 y.o. MRN: [id number]  CC: Medical Management of Chronic Issues (Coughing and wheezing for 2 weeks/)     Discussed the use of AI scribe software for clinical note transcription with the patient, who gave verbal consent to proceed.  History of Present Illness Lydia Vasquez is a 63 year old female with a history of hypertension, depression, cervical cancer status post hysterectomy who presents with a persistent cough and wheezing.  She has experienced a persistent cough and wheezing for two weeks. The cough is mostly dry but can produce phlegm. There is no fever, chills, or forehead pain, but she has body aches and pain in her sides from coughing. Wheezing is more pronounced when lying on her side. Shortness of breath sometimes worsens. She suspects a cold from her grandchild.  She has elevated blood pressure since running out of her hypertension medication a few days ago. She does not have a blood pressure cuff at home.  She takes Pepcid  for acid reflux and requires a refill. She also takes vitamin D  weekly but has run out. Her vitamin D  levels were very low in March.  She is taking Prozac  for depression, which was increased to 60 mg but feels it is not effective. She has not seen her psychiatrist or therapist recently and reports intermittent suicidal thoughts in the past without current intent or plan. She is switching therapists as she does not get along well with her current therapist.  She experienced bleeding after using vaginal estradiol  pills and would like to see her GYN regarding this. She reports 'funny beats' in her heart and a sensation of her heart skipping beats. She has not had an EKG since 2022.    Past Medical History:  Diagnosis Date   Anxiety    Depression    Hyperlipidemia    Hypertension    Ventricular tachycardia (HCC) 06/11/2020    Past Surgical History:  Procedure Laterality Date    ABDOMINAL HYSTERECTOMY     CESAREAN SECTION      Family History  Problem Relation Age of Onset   Stroke Mother    Dementia Mother     Social History   Socioeconomic History   Marital status: Single    Spouse name: Not on file   Number of children: Not on file   Years of education: Not on file   Highest education level: Not on file  Occupational History   Not on file  Tobacco Use   Smoking status: Former    Types: Cigarettes    Passive exposure: Past   Smokeless tobacco: Never  Vaping Use   Vaping status: Never Used  Substance and Sexual Activity   Alcohol use: Not Currently   Drug use: Never   Sexual activity: Not Currently  Other Topics Concern   Not on file  Social History Narrative   Not on file   Social Drivers of Health   Financial Resource Strain: Low Risk  (02/12/2021)   Overall Financial Resource Strain (CARDIA)    Difficulty of Paying Living Expenses: Not hard at all  Food Insecurity: No Food Insecurity (06/25/2023)   Hunger Vital Sign    Worried About Running Out of Food in the Last Year: Never true    Ran Out of Food in the Last Year: Never true  Transportation Needs: No Transportation Needs (06/25/2023)   PRAPARE - Administrator, Civil Service (Medical): No  Lack of Transportation (Non-Medical): No  Physical Activity: Inactive (10/15/2022)   Exercise Vital Sign    Days of Exercise per Week: 0 days    Minutes of Exercise per Session: 0 min  Stress: Stress Concern Present (10/15/2022)   [employer] of Occupational Health - Occupational Stress Questionnaire    Feeling of Stress : Very much  Social Connections: Socially Isolated (10/15/2022)   Social Connection and Isolation Panel    Frequency of Communication with Friends and Family: Twice a week    Frequency of Social Gatherings with Friends and Family: Once a week    Attends Religious Services: Never    Database administrator or Organizations: No    Attends Hospital doctor: Never    Marital Status: Never married    No Known Allergies  Outpatient Medications Prior to Visit  Medication Sig Dispense Refill   carvedilol  (COREG ) 6.25 MG tablet Take 1 tablet (6.25 mg total) by mouth 2 (two) times daily with a meal. 60 tablet 3   famotidine  (PEPCID ) 20 MG tablet TAKE 1 TABLET(20 MG) BY MOUTH TWICE DAILY 120 tablet 4   Vitamin D , Ergocalciferol , (DRISDOL ) 1.25 MG (50000 UNIT) CAPS capsule Take 1 capsule (50,000 Units total) by mouth every 7 (seven) days. 16 capsule 0   B Complex-C-Folic Acid (B COMPLEX-VITAMIN C-FOLIC ACID) 1 MG tablet Take 1 tablet by mouth daily with breakfast. (Patient not taking: Reported on 10/13/2023) 60 tablet 2   Blood Pressure Monitoring (BLOOD PRESSURE CUFF) MISC 1 each by Does not apply route daily. 1 each 0   cyproheptadine  (PERIACTIN ) 4 MG tablet Take 1 tablet (4 mg total) by mouth at bedtime. (Patient not taking: Reported on 10/13/2023) 30 tablet 0   Estradiol  10 MCG TABS vaginal tablet Place 1 tablet (10 mcg total) vaginally at bedtime. Nightly for 2 weeks and then 2 nights a week thereafter (Patient not taking: Reported on 10/13/2023) 30 tablet 12   FLUoxetine  (PROZAC ) 20 MG capsule Take 1 capsule (20 mg total) by mouth daily. Take with 40 mg capsule for daily total of 60 mg. (Patient not taking: Reported on 10/13/2023) 30 capsule 1   FLUoxetine  (PROZAC ) 40 MG capsule Take 40 mg by mouth daily. (Patient not taking: Reported on 10/13/2023)     FLUoxetine  (PROZAC ) 40 MG capsule Take 1 capsule (40 mg total) by mouth daily. Take with 20 mg capsule for daily total of 60 mg. (Patient not taking: Reported on 10/13/2023) 30 capsule 1   No facility-administered medications prior to visit.     ROS Review of Systems  Constitutional:  Negative for activity change and appetite change.  HENT:  Negative for sinus pressure and sore throat.   Respiratory:  Positive for cough, shortness of breath and wheezing. Negative for chest tightness.    Cardiovascular:  Positive for palpitations. Negative for chest pain.  Gastrointestinal:  Negative for abdominal distention, abdominal pain and constipation.  Genitourinary: Negative.   Musculoskeletal: Negative.   Psychiatric/Behavioral:  Positive for dysphoric mood. Negative for behavioral problems.     Objective:  BP (!) 142/91   Pulse 82   Temp 98.3 F (36.8 C) (Oral)   Ht 5' 11 (1.803 m)   Wt 290 lb 6.4 oz (131.7 kg)   SpO2 99%   BMI 40.50 kg/m      10/13/2023    9:33 AM 10/13/2023    8:41 AM 06/25/2023    9:42 AM  BP/Weight  Systolic BP 142 157 134  Diastolic  BP 91 89 86  Wt. (Lbs)  290.4 287.13  BMI  40.5 kg/m2 40.05 kg/m2      Physical Exam Constitutional:      Appearance: She is well-developed.  Cardiovascular:     Rate and Rhythm: Normal rate.     Heart sounds: Normal heart sounds. No murmur heard. Pulmonary:     Effort: Pulmonary effort is normal.     Breath sounds: Normal breath sounds. No wheezing or rales.  Chest:     Chest wall: No tenderness.  Abdominal:     General: Bowel sounds are normal. There is no distension.     Palpations: Abdomen is soft. There is no mass.     Tenderness: There is no abdominal tenderness.  Musculoskeletal:        General: Normal range of motion.     Right lower leg: No edema.     Left lower leg: No edema.  Neurological:     Mental Status: She is alert and oriented to person, place, and time.  Psychiatric:     Comments: Dysphoric mood        Latest Ref Rng & Units 03/26/2023    9:22 AM 06/18/2022   12:08 PM 10/15/2020    9:03 AM  CMP  Glucose 70 - 99 mg/dL 881  97  95   BUN 8 - 27 mg/dL 13  9  10    Creatinine 0.57 - 1.00 mg/dL 9.21  9.28  9.43   Sodium 134 - 144 mmol/L 140  140  139   Potassium 3.5 - 5.2 mmol/L 4.2  3.7  3.6   Chloride 96 - 106 mmol/L 102  101  100   CO2 20 - 29 mmol/L 22  24  22    Calcium  8.7 - 10.3 mg/dL 9.1  9.8  9.3   Total Protein 6.0 - 8.5 g/dL 7.4  7.6  7.5   Total Bilirubin 0.0 - 1.2  mg/dL 1.6  1.0  1.0   Alkaline Phos 44 - 121 IU/L 190  188  191   AST 0 - 40 IU/L 46  21  35   ALT 0 - 32 IU/L 22  12  19      Lipid Panel     Component Value Date/Time   CHOL 192 10/26/2019 0559   TRIG 332 (H) 10/26/2019 0559   HDL <10 (L) 10/26/2019 0559   CHOLHDL NOT CALCULATED 10/26/2019 0559   VLDL 66 (H) 10/26/2019 0559   LDLCALC NOT CALCULATED 10/26/2019 0559    CBC    Component Value Date/Time   WBC 7.2 03/26/2023 0922   WBC 11.2 (H) 11/29/2019 0859   RBC 4.76 03/26/2023 0922   RBC 3.43 (L) 11/29/2019 0859   HGB 13.6 03/26/2023 0922   HCT 41.4 03/26/2023 0922   PLT 319 03/26/2023 0922   MCV 87 03/26/2023 0922   MCH 28.6 03/26/2023 0922   MCH 32.9 11/29/2019 0859   MCHC 32.9 03/26/2023 0922   MCHC 31.6 11/29/2019 0859   RDW 14.0 03/26/2023 0922   LYMPHSABS 2.4 03/26/2023 0922   MONOABS 0.6 11/29/2019 0859   EOSABS 0.2 03/26/2023 0922   BASOSABS 0.1 03/26/2023 9077    Lab Results  Component Value Date   HGBA1C 5.9 (H) 03/26/2023    Lab Results  Component Value Date   TSH 2.340 03/26/2023       Assessment & Plan Acute upper respiratory infection with cough and wheezing Cough and wheezing likely due to upper respiratory infection. No  pneumonia. - Prescribe Tessalon  for cough. - Prescribe prednisone  for 5 days. - Prescribe inhaler for wheezing.  Palpitations with abnormal EKG  Palpitations with irregular heartbeats. 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  - Order EKG. - Refer to cardiology for evaluation and stress test.  Essential hypertension Blood pressure elevated. Missed medication for a few days. Repeat blood pressure slightly better. - Continue Covedilol. - Follow up in 3 months. - Consider additional medication if blood pressure remains high. -Counseled on blood pressure goal of less than 130/80, low-sodium, DASH diet, medication compliance, 150 minutes of moderate intensity exercise per week. Discussed medication  compliance, adverse effects.   Depression  Ongoing depression with intermittent suicidal ideation. Prozac  60 mg ineffective. No immediate suicidal ideation or intent at the moment -She will need more frequent counseling sessions and has been advised to discuss this with her therapist - Encourage follow-up with behavioral health for therapist and psychiatrist change. - Advise on walk-in option for behavioral health services. - Monitor mental health closely.   Vitamin D  deficiency Vitamin D  very low in March. Inconsistent supplementation. - Order repeat vitamin D  level. - Continue current vitamin D  supplementation regimen.  General Health Maintenance Requests referrals for colonoscopy and gynecological follow-up. History of hysterectomy and previous cancer treatment. - Refer to gastroenterology for colonoscopy. - Advise her to contact gynecologist for follow-up.     Healthcare maintenance Screening for colon cancer-GI referral placed for colonoscopy  Meds ordered this encounter  Medications   famotidine  (PEPCID ) 20 MG tablet    Sig: TAKE 1 TABLET(20 MG) BY MOUTH TWICE DAILY    Dispense:  120 tablet    Refill:  4   Vitamin D , Ergocalciferol , (DRISDOL ) 1.25 MG (50000 UNIT) CAPS capsule    Sig: Take 1 capsule (50,000 Units total) by mouth every 7 (seven) days.    Dispense:  16 capsule    Refill:  1   predniSONE  (DELTASONE ) 20 MG tablet    Sig: Take 1 tablet (20 mg total) by mouth daily with breakfast.    Dispense:  5 tablet    Refill:  0   benzonatate  (TESSALON ) 200 MG capsule    Sig: Take 1 capsule (200 mg total) by mouth 2 (two) times daily as needed for cough.    Dispense:  20 capsule    Refill:  0   albuterol  (VENTOLIN  HFA) 108 (90 Base) MCG/ACT inhaler    Sig: Inhale 2 puffs into the lungs every 6 (six) hours as needed for wheezing or shortness of breath.    Dispense:  8 g    Refill:  0   carvedilol  (COREG ) 6.25 MG tablet    Sig: Take 1 tablet (6.25 mg total) by  mouth 2 (two) times daily with a meal.    Dispense:  60 tablet    Refill:  3    Follow-up: Return in about 3 months (around 01/12/2024) for Chronic medical conditions.       Corrina Sabin, MD, FAAFP. [facility] [place], [place] [phone]   10/13/2023, 1:49 PM

## 2023-10-13 NOTE — Patient Instructions (Signed)
 VISIT SUMMARY:  Today, you were seen for a persistent cough and wheezing, elevated blood pressure, and other ongoing health concerns. We discussed your symptoms, reviewed your medications, and made several adjustments to your treatment plan.  YOUR PLAN:  -ACUTE UPPER RESPIRATORY INFECTION WITH COUGH AND WHEEZING: You have an upper respiratory infection causing your cough and wheezing. This is not pneumonia. We have prescribed Tessalon  for your cough, prednisone  for 5 days, and an inhaler to help with the wheezing.  -PALPITATIONS WITH ABNORMAL EKG AND HISTORY OF OLD MYOCARDIAL INFARCTION: You have irregular heartbeats and an EKG showing signs of a past heart attack that was not present in your 2022 EKG. We will order another EKG and refer you to a cardiologist for further evaluation and a stress test.  -ESSENTIAL HYPERTENSION: Your blood pressure is elevated because you missed your medication for a few days. We will continue your current medication, Covedilol, and follow up in 3 months. If your blood pressure remains high, we may consider additional medication.  -DEPRESSION WITH INTERMITTENT SUICIDAL IDEATION: Your depression is ongoing, and the current dose of Prozac  is not effective. You have had intermittent suicidal thoughts but no immediate intent. We encourage you to follow up with behavioral health services for a therapist and psychiatrist change, and consider using walk-in options for immediate support. We will monitor your mental health closely.  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 We will order liver function tests to monitor your liver health.  -GASTROESOPHAGEAL REFLUX DISEASE (GERD): You need a refill of Pepcid  to manage your acid reflux. We have prescribed the refill for you.  -VITAMIN D  DEFICIENCY: Your vitamin D  levels were very low in March, and you have been inconsistent with  supplementation. We will order a repeat vitamin D  level test and continue your current supplementation regimen.  -GENERAL HEALTH MAINTENANCE: We will refer you to gastroenterology for a colonoscopy and advise you to contact your gynecologist for follow-up care.  INSTRUCTIONS:  Please follow up with the cardiologist for your heart evaluation and stress test. Schedule an appointment with behavioral health services for your mental health support. Continue taking your medications as prescribed and monitor your symptoms. Contact your gynecologist for follow-up care and schedule a colonoscopy with gastroenterology. We will see you again in 3 months to reassess your blood pressure and overall health.

## 2023-10-14 ENCOUNTER — Ambulatory Visit: Payer: Self-pay | Admitting: Family Medicine

## 2023-10-14 LAB — CMP14+EGFR
ALT: 25 IU/L (ref 0–32)
AST: 40 IU/L (ref 0–40)
Albumin: 3.9 g/dL (ref 3.9–4.9)
Alkaline Phosphatase: 130 IU/L (ref 49–135)
BUN/Creatinine Ratio: 16 (ref 12–28)
BUN: 11 mg/dL (ref 8–27)
Bilirubin Total: 0.6 mg/dL (ref 0.0–1.2)
CO2: 24 mmol/L (ref 20–29)
Calcium: 9.9 mg/dL (ref 8.7–10.3)
Chloride: 100 mmol/L (ref 96–106)
Creatinine, Ser: 0.67 mg/dL (ref 0.57–1.00)
Globulin, Total: 3.5 g/dL (ref 1.5–4.5)
Glucose: 92 mg/dL (ref 70–99)
Potassium: 4.2 mmol/L (ref 3.5–5.2)
Sodium: 143 mmol/L (ref 134–144)
Total Protein: 7.4 g/dL (ref 6.0–8.5)
eGFR: 99 mL/min/1.73 (ref >59)

## 2023-10-14 LAB — LP+NON-HDL CHOLESTEROL
Cholesterol, Total: 203 mg/dL — ABNORMAL HIGH (ref 100–199)
HDL: 42 mg/dL (ref >39)
LDL Chol Calc (NIH): 143 mg/dL — ABNORMAL HIGH (ref 0–99)
Total Non-HDL-Chol (LDL+VLDL): 161 mg/dL — ABNORMAL HIGH (ref 0–129)
Triglycerides: 98 mg/dL (ref 0–149)
VLDL Cholesterol Cal: 18 mg/dL (ref 5–40)

## 2023-10-14 LAB — VITAMIN D 25 HYDROXY (VIT D DEFICIENCY, FRACTURES): Vit D, 25-Hydroxy: 21.6 ng/mL — ABNORMAL LOW (ref 30.0–100.0)

## 2023-10-14 MED ORDER — ATORVASTATIN CALCIUM 20 MG PO TABS: 20.0000 mg | ORAL_TABLET | Freq: Every day | ORAL | 1 refills | Status: AC

## 2023-10-16 NOTE — Telephone Encounter (Signed)
 Patient advised of results.

## 2023-10-16 NOTE — Telephone Encounter (Signed)
 Copied from CRM #[id number]. Topic: Clinical - Lab/Test Results >> Oct 16, 2023  2:59 PM Emylou G wrote: Reason for CRM: Adv patient of her lab results

## 2024-01-12 ENCOUNTER — Ambulatory Visit: Payer: MEDICAID | Admitting: Family Medicine

## 2024-01-22 ENCOUNTER — Encounter (HOSPITAL_COMMUNITY): Payer: Self-pay

## 2024-01-22 ENCOUNTER — Other Ambulatory Visit: Payer: Self-pay

## 2024-01-22 ENCOUNTER — Emergency Department (HOSPITAL_COMMUNITY)
Admission: EM | Admit: 2024-01-22 | Discharge: 2024-01-23 | Discharge status: Against Medical Advice | Payer: MEDICAID | Attending: Student | Admitting: Student

## 2024-01-22 DIAGNOSIS — R42 Dizziness and giddiness: Secondary | ICD-10-CM | POA: Diagnosis not present

## 2024-01-22 DIAGNOSIS — W109XXA Fall (on) (from) unspecified stairs and steps, initial encounter: Secondary | ICD-10-CM | POA: Insufficient documentation

## 2024-01-22 DIAGNOSIS — Z5321 Procedure and treatment not carried out due to patient leaving prior to being seen by health care provider: Secondary | ICD-10-CM | POA: Insufficient documentation

## 2024-01-22 DIAGNOSIS — R040 Epistaxis: Secondary | ICD-10-CM | POA: Diagnosis not present

## 2024-01-22 DIAGNOSIS — R519 Headache, unspecified: Secondary | ICD-10-CM | POA: Diagnosis present

## 2024-01-22 NOTE — ED Triage Notes (Addendum)
 Patient brought in by EMS. Patient had a fall last night after drinking alcohol. Fell down 8 steps hitting her left leg, left arm and  front head. Today she had a nosebleed and called EMS. Patient has bruising to the left eye and head. Patient nose is not actively bleeding while in triage. No blood thinners.

## 2024-01-23 ENCOUNTER — Emergency Department (HOSPITAL_COMMUNITY): Payer: MEDICAID

## 2024-01-23 LAB — CBC WITH DIFFERENTIAL/PLATELET
Abs Immature Granulocytes: 0.05 K/uL (ref 0.00–0.07)
Basophils Absolute: 0.1 K/uL (ref 0.0–0.1)
Basophils Relative: 1 %
Eosinophils Absolute: 0.1 K/uL (ref 0.0–0.5)
Eosinophils Relative: 1 %
HCT: 41.6 % (ref 36.0–46.0)
Hemoglobin: 13.7 g/dL (ref 12.0–15.0)
Immature Granulocytes: 1 %
Lymphocytes Relative: 23 %
Lymphs Abs: 1.7 K/uL (ref 0.7–4.0)
MCH: 33.5 pg (ref 26.0–34.0)
MCHC: 32.9 g/dL (ref 30.0–36.0)
MCV: 101.7 fL — ABNORMAL HIGH (ref 80.0–100.0)
Monocytes Absolute: 0.6 K/uL (ref 0.1–1.0)
Monocytes Relative: 9 %
Neutro Abs: 4.9 K/uL (ref 1.7–7.7)
Neutrophils Relative %: 65 %
Platelets: 357 K/uL (ref 150–400)
RBC: 4.09 MIL/uL (ref 3.87–5.11)
RDW: 18.4 % — ABNORMAL HIGH (ref 11.5–15.5)
WBC: 7.4 K/uL (ref 4.0–10.5)
nRBC: 0 % (ref 0.0–0.2)

## 2024-01-23 LAB — COMPREHENSIVE METABOLIC PANEL WITH GFR
ALT: 62 U/L — ABNORMAL HIGH (ref 0–44)
AST: 173 U/L — ABNORMAL HIGH (ref 15–41)
Albumin: 3.2 g/dL — ABNORMAL LOW (ref 3.5–5.0)
Alkaline Phosphatase: 192 U/L — ABNORMAL HIGH (ref 38–126)
Anion gap: 11 (ref 5–15)
BUN: 11 mg/dL (ref 8–23)
CO2: 28 mmol/L (ref 22–32)
Calcium: 8.9 mg/dL (ref 8.9–10.3)
Chloride: 103 mmol/L (ref 98–111)
Creatinine, Ser: 0.62 mg/dL (ref 0.44–1.00)
GFR, Estimated: >60 mL/min
Glucose, Bld: 96 mg/dL (ref 70–99)
Potassium: 3.7 mmol/L (ref 3.5–5.1)
Sodium: 141 mmol/L (ref 135–145)
Total Bilirubin: 1.6 mg/dL — ABNORMAL HIGH (ref 0.0–1.2)
Total Protein: 7.2 g/dL (ref 6.5–8.1)

## 2024-01-23 NOTE — ED Notes (Signed)
 Called pt for vitals, no response at this time.

## 2024-01-23 NOTE — ED Provider Triage Note (Signed)
 Emergency Medicine Provider Triage Evaluation Note  Lydia Vasquez , a 64 y.o. female  was evaluated in triage.  Pt complains of headache nosebleeds, lightheadedness after fall down flight of stairs yesterday.  Review of Systems  Positive: As above Negative: N/V/confusion, LOC  Physical Exam  BP 129/72 (BP Location: Right Arm)   Pulse (!) 105   Temp 98.4 F (36.9 C) (Oral)   Resp 20   SpO2 95%  Gen:   Awake, no distress   Resp:  Normal effort  MSK:   Moves extremities without difficulty  Other:  Abrasions and bruising around the left eye and frontal scalp  Medical Decision Making  Medically screening exam initiated at 1:14 AM.  Appropriate orders placed.  Lydia Vasquez was informed that the remainder of the evaluation will be completed by another provider, this initial triage assessment does not replace that evaluation, and the importance of remaining in the ED until their evaluation is complete.  This chart was dictated using voice recognition software, Dragon. Despite the best efforts of this provider to proofread and correct errors, errors may still occur which can change documentation meaning.    Bobette Pleasant SAUNDERS, PA-C 01/23/24 0116

## 2024-01-23 NOTE — ED Notes (Signed)
 Pt called for vitals no response

## 2024-01-23 NOTE — ED Notes (Signed)
Pt called for room x3, no answer
